# Patient Record
Sex: Female | Born: 1960
Health system: Southern US, Community
[De-identification: ages and names within clinical notes are randomized; demographics above are authoritative.]

## PROBLEM LIST (undated history)

## (undated) ENCOUNTER — Emergency Department (HOSPITAL_COMMUNITY): Discharge status: Absent without leave | Payer: Medicare Other

## (undated) DIAGNOSIS — F329 Major depressive disorder, single episode, unspecified: Secondary | ICD-10-CM

## (undated) DIAGNOSIS — I10 Essential (primary) hypertension: Secondary | ICD-10-CM

## (undated) DIAGNOSIS — G8929 Other chronic pain: Secondary | ICD-10-CM

## (undated) DIAGNOSIS — M1990 Unspecified osteoarthritis, unspecified site: Secondary | ICD-10-CM

## (undated) DIAGNOSIS — R002 Palpitations: Principal | ICD-10-CM

## (undated) DIAGNOSIS — Z8489 Family history of other specified conditions: Secondary | ICD-10-CM

## (undated) DIAGNOSIS — G4730 Sleep apnea, unspecified: Secondary | ICD-10-CM

## (undated) DIAGNOSIS — G43909 Migraine, unspecified, not intractable, without status migrainosus: Secondary | ICD-10-CM

## (undated) DIAGNOSIS — L02222 Furuncle of back [any part, except buttock]: Secondary | ICD-10-CM

## (undated) DIAGNOSIS — J189 Pneumonia, unspecified organism: Secondary | ICD-10-CM

## (undated) DIAGNOSIS — M545 Low back pain, unspecified: Secondary | ICD-10-CM

## (undated) DIAGNOSIS — F32A Depression, unspecified: Secondary | ICD-10-CM

## (undated) DIAGNOSIS — G629 Polyneuropathy, unspecified: Secondary | ICD-10-CM

## (undated) DIAGNOSIS — E0590 Thyrotoxicosis, unspecified without thyrotoxic crisis or storm: Secondary | ICD-10-CM

## (undated) DIAGNOSIS — E119 Type 2 diabetes mellitus without complications: Secondary | ICD-10-CM

## (undated) DIAGNOSIS — K219 Gastro-esophageal reflux disease without esophagitis: Secondary | ICD-10-CM

## (undated) DIAGNOSIS — E7800 Pure hypercholesterolemia, unspecified: Secondary | ICD-10-CM

## (undated) HISTORY — PX: LAPAROSCOPY FOR ECTOPIC PREGNANCY: SUR765

## (undated) HISTORY — PX: ESOPHAGOGASTRODUODENOSCOPY: SHX1529

## (undated) HISTORY — PX: COLONOSCOPY: SHX174

## (undated) HISTORY — DX: Palpitations: R00.2

## (undated) HISTORY — PX: TUBAL LIGATION: SHX77

## (undated) HISTORY — PX: BUNIONECTOMY: SHX129

## (undated) HISTORY — PX: TONSILLECTOMY: SUR1361

## (undated) HISTORY — PX: OTHER SURGICAL HISTORY: SHX169

## (undated) HISTORY — PX: BREAST BIOPSY: SHX20

## (undated) HISTORY — DX: Essential (primary) hypertension: I10

---

## 1999-02-22 HISTORY — PX: KNEE ARTHROSCOPY W/ MENISCAL REPAIR: SHX1877

## 1999-05-31 ENCOUNTER — Emergency Department (HOSPITAL_COMMUNITY): Admission: EM | Admit: 1999-05-31 | Discharge: 1999-05-31 | Payer: Self-pay | Admitting: Emergency Medicine

## 1999-07-24 ENCOUNTER — Emergency Department (HOSPITAL_COMMUNITY): Admission: EM | Admit: 1999-07-24 | Discharge: 1999-07-24 | Payer: Self-pay | Admitting: Emergency Medicine

## 2000-05-29 ENCOUNTER — Encounter (HOSPITAL_COMMUNITY): Admission: RE | Admit: 2000-05-29 | Discharge: 2000-06-28 | Payer: Self-pay | Admitting: Specialist

## 2000-08-04 ENCOUNTER — Emergency Department (HOSPITAL_COMMUNITY): Admission: EM | Admit: 2000-08-04 | Discharge: 2000-08-04 | Payer: Self-pay | Admitting: Emergency Medicine

## 2000-08-04 ENCOUNTER — Encounter: Payer: Self-pay | Admitting: Emergency Medicine

## 2000-11-29 ENCOUNTER — Emergency Department (HOSPITAL_COMMUNITY): Admission: EM | Admit: 2000-11-29 | Discharge: 2000-11-29 | Payer: Self-pay | Admitting: Emergency Medicine

## 2001-12-18 ENCOUNTER — Encounter (HOSPITAL_COMMUNITY): Admission: RE | Admit: 2001-12-18 | Discharge: 2002-01-17 | Payer: Self-pay

## 2002-04-01 ENCOUNTER — Emergency Department (HOSPITAL_COMMUNITY): Admission: EM | Admit: 2002-04-01 | Discharge: 2002-04-01 | Payer: Self-pay

## 2003-04-27 ENCOUNTER — Emergency Department (HOSPITAL_COMMUNITY): Admission: AD | Admit: 2003-04-27 | Discharge: 2003-04-27 | Payer: Self-pay | Admitting: Emergency Medicine

## 2003-04-27 IMAGING — CR DG LUMBAR SPINE COMPLETE 4+V
5 series · 5 of 5 positions shown · non-contrast
Comparison: none

CLINICAL DATA: 42-year-old, low back pain.
 FIVE-VIEW LUMBAR SPINE SERIES
 There are five non-rib-bearing lumbar type vertebral bodies.  Lateral film demonstrates normal overall alignment.  vertebral bodies and disc spaces are maintained.  There is mild facet degenerative changes but no pars defects are seen.  Dense calcifications are noted in the aorta but no evidence for aneurysm.  
 IMPRESSION 
 Normal alignment.  No acute bony findings.
 Mild facet degenerative changes.
 Dense aortic calcifications, quite advanced for age.

[view not recorded (1 of 5)]
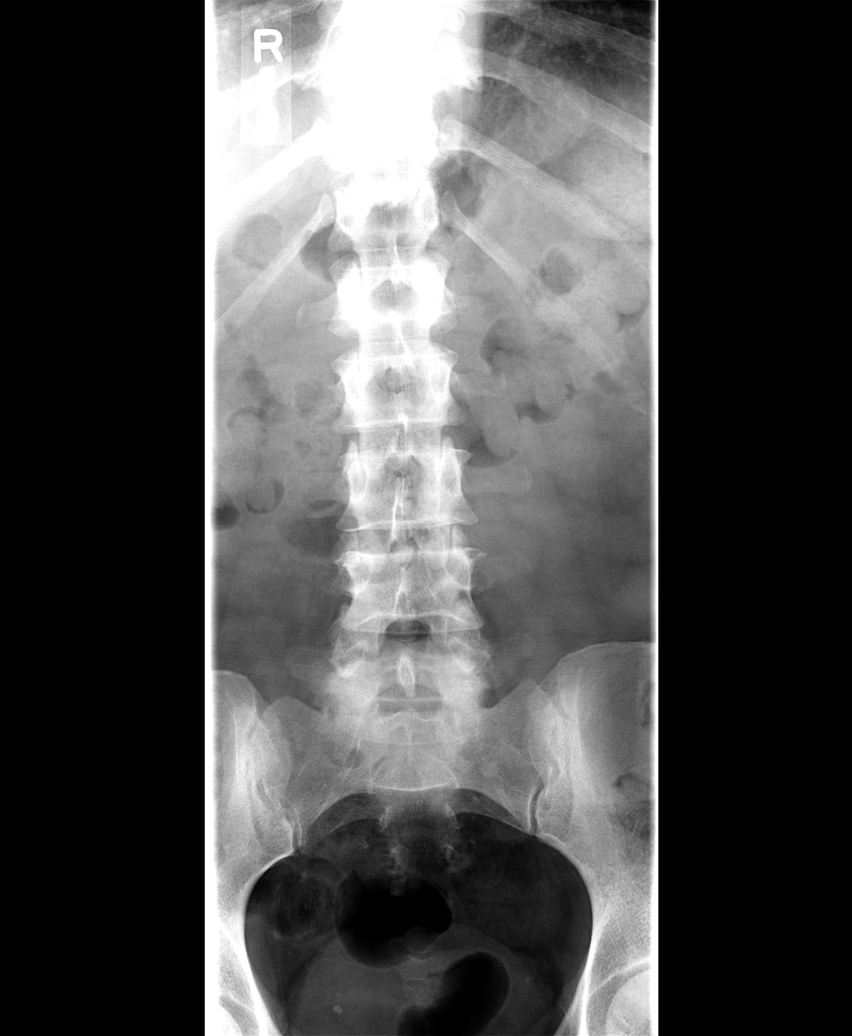

[view not recorded (2 of 5)]
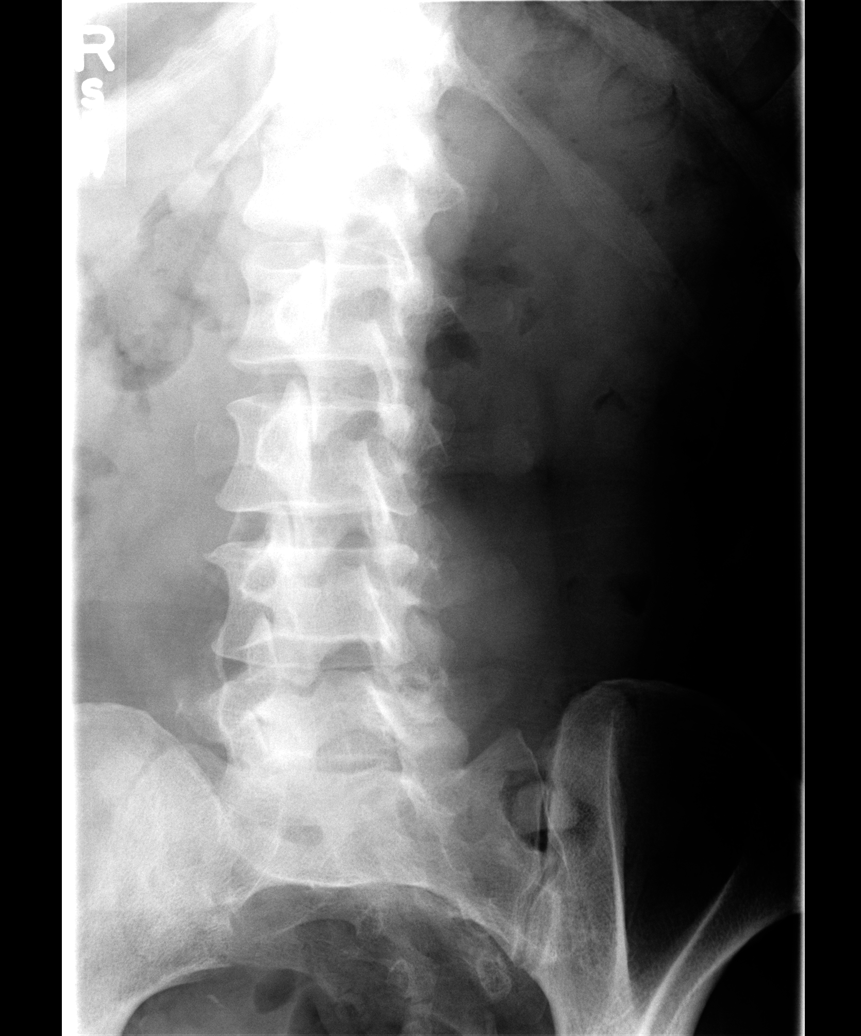

[view not recorded (3 of 5)]
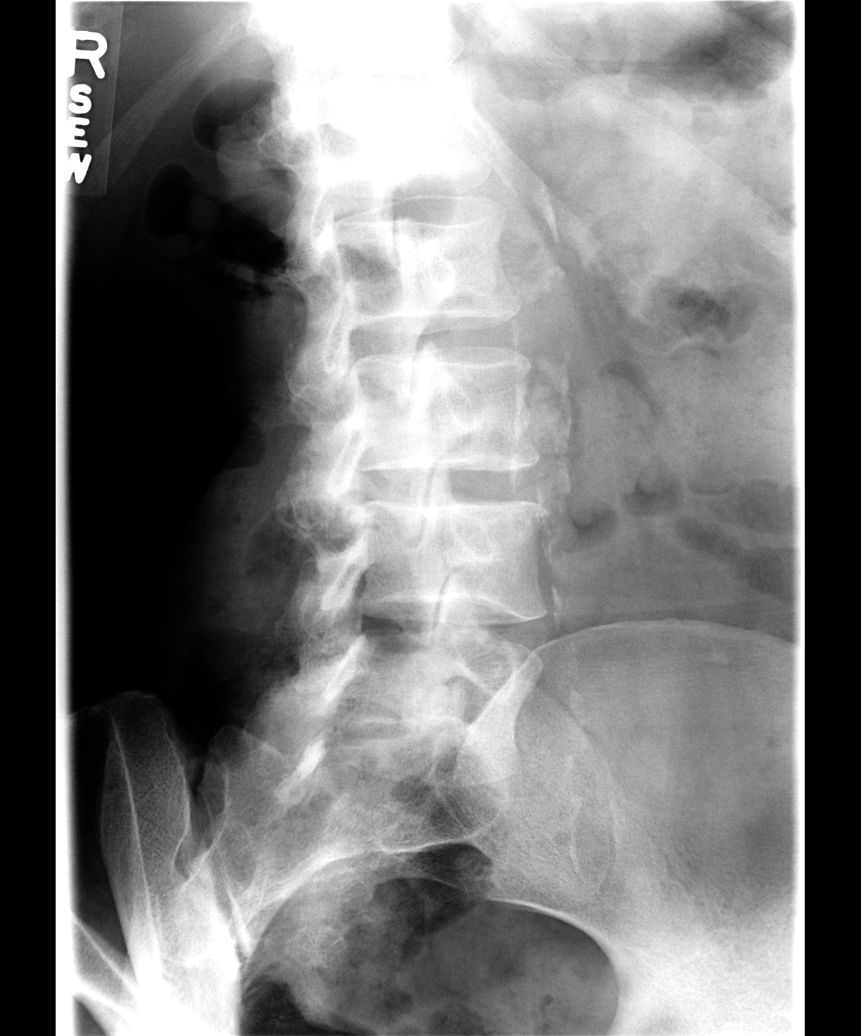

[view not recorded (4 of 5)]
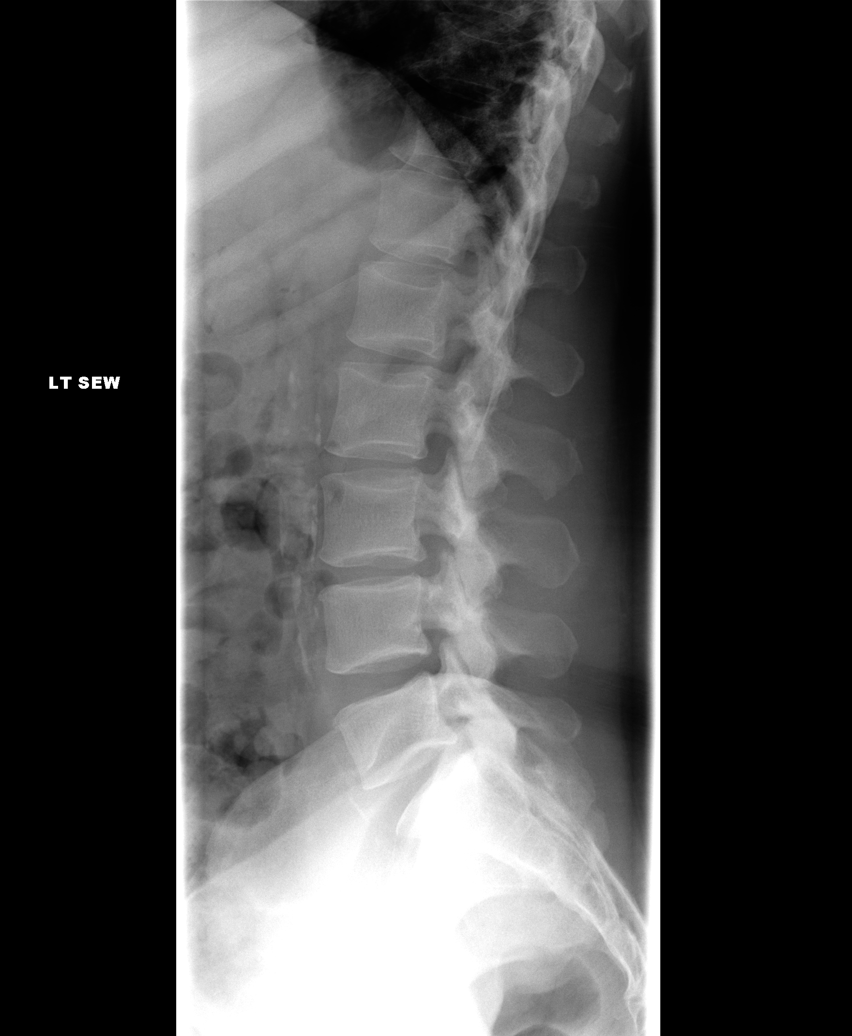

[view not recorded (5 of 5)]
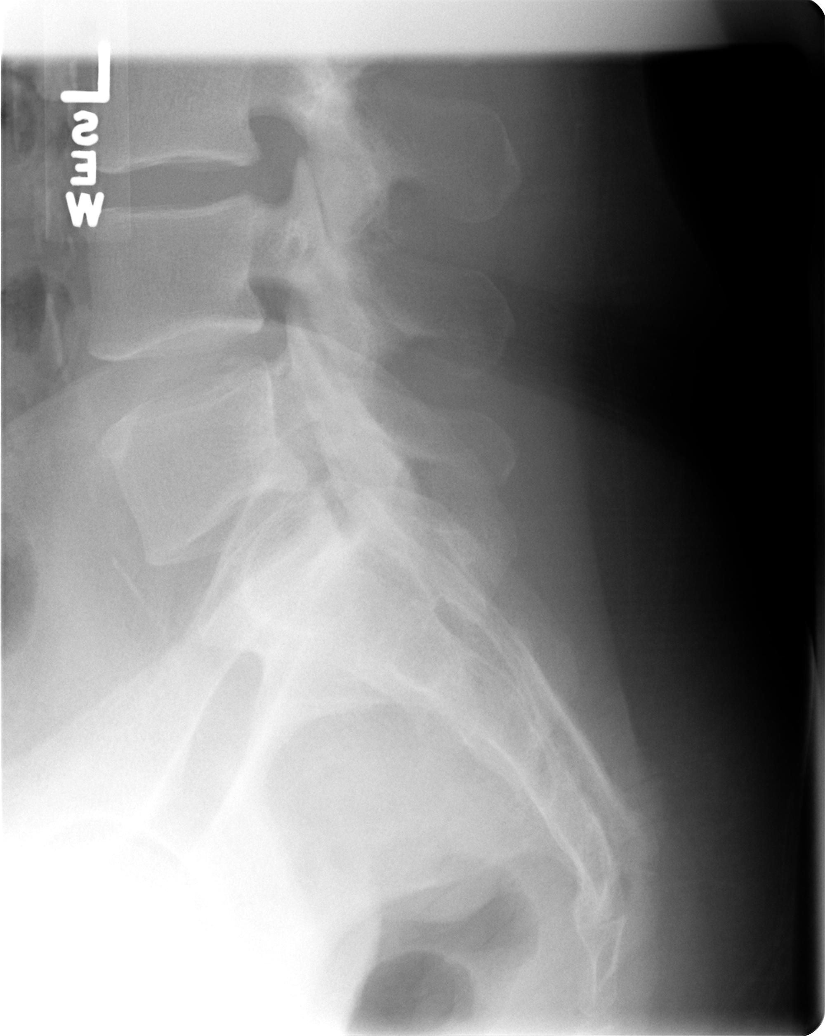

[5 of 5 positions shown; findings below may reference images not displayed]

## 2005-08-30 ENCOUNTER — Emergency Department (HOSPITAL_COMMUNITY): Admission: EM | Admit: 2005-08-30 | Discharge: 2005-08-30 | Payer: Self-pay | Admitting: Emergency Medicine

## 2006-11-18 ENCOUNTER — Emergency Department (HOSPITAL_COMMUNITY): Admission: EM | Admit: 2006-11-18 | Discharge: 2006-11-18 | Payer: Self-pay | Admitting: Emergency Medicine

## 2007-02-22 HISTORY — PX: REPLACEMENT TOTAL KNEE: SUR1224

## 2007-06-14 ENCOUNTER — Inpatient Hospital Stay (HOSPITAL_COMMUNITY): Admission: RE | Admit: 2007-06-14 | Discharge: 2007-06-16 | Payer: Self-pay | Admitting: Orthopaedic Surgery

## 2008-01-29 ENCOUNTER — Ambulatory Visit (HOSPITAL_COMMUNITY): Admission: RE | Admit: 2008-01-29 | Discharge: 2008-01-29 | Payer: Self-pay | Admitting: Family Medicine

## 2008-01-29 IMAGING — US US SOFT TISSUE HEAD/NECK
1 series · 14 of 25 positions shown · non-contrast
Comparison: None

CLINICAL DATA: Large left thyroid lobe

THYROID ULTRASOUND
TECHNIQUE: Ultrasound examination of the thyroid gland and
adjacent soft tissues was performed.

[Series 1: us soft tissue head/neck · 0.07mm/px · 14 of 49 slices shown]
[im 1/49]
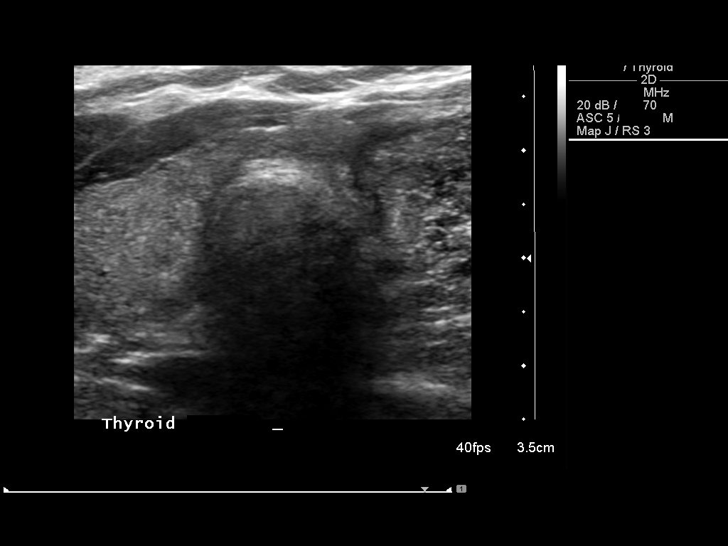
[im 5/49]
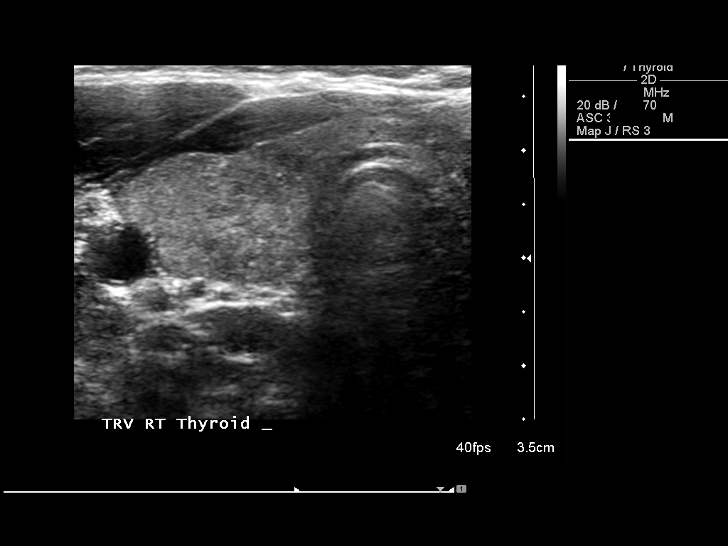
[im 9/49]
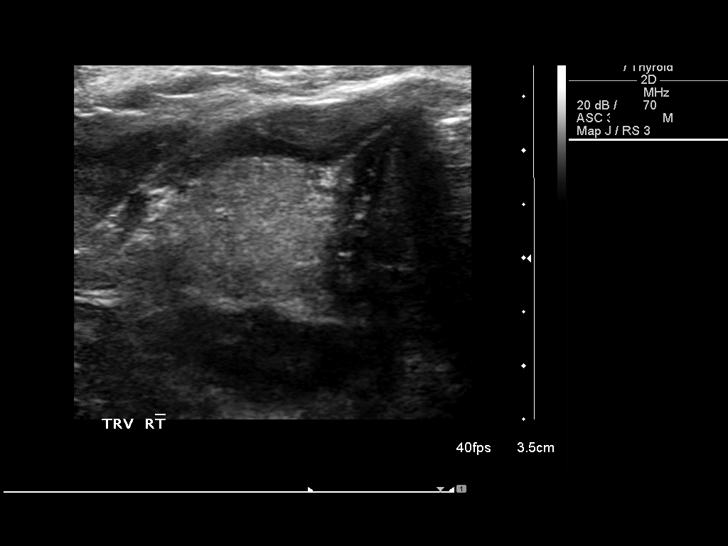
[im 13/49]
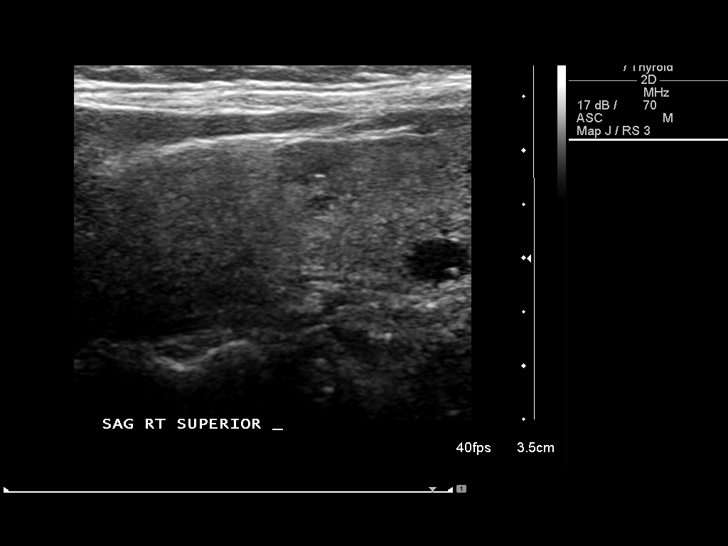
[im 17/49]
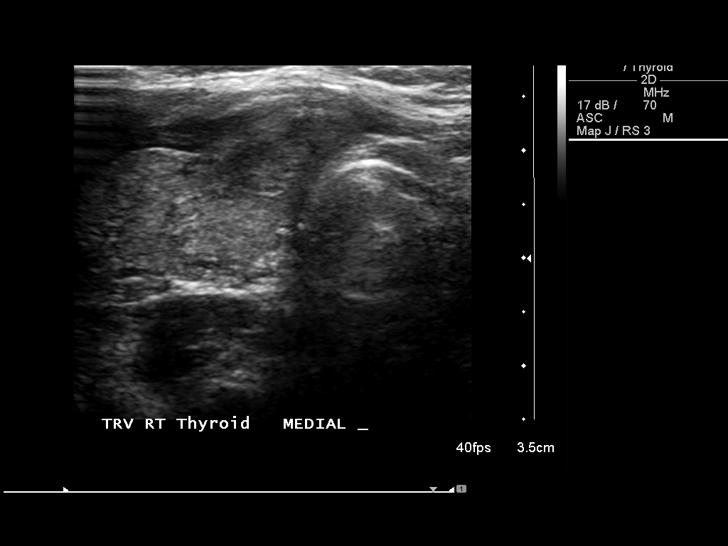
[im 19/49]
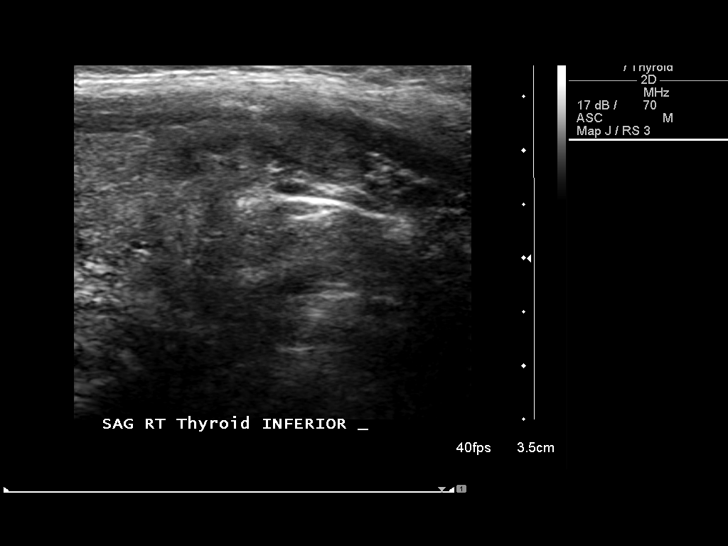
[im 23/49]
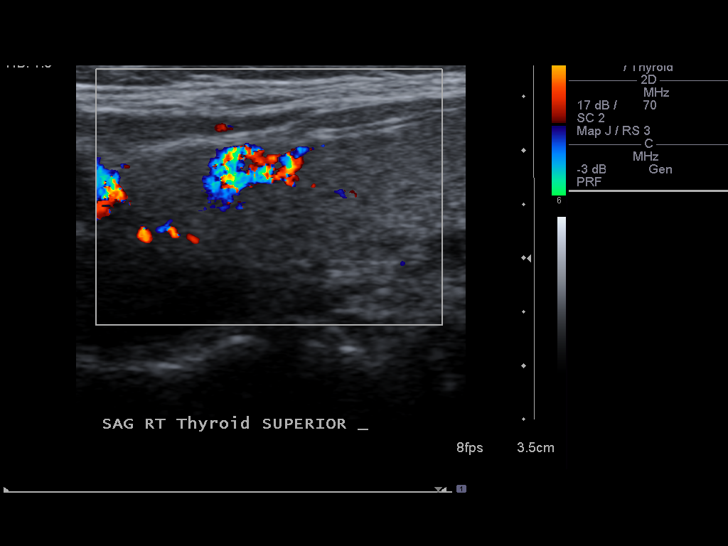
[im 27/49]
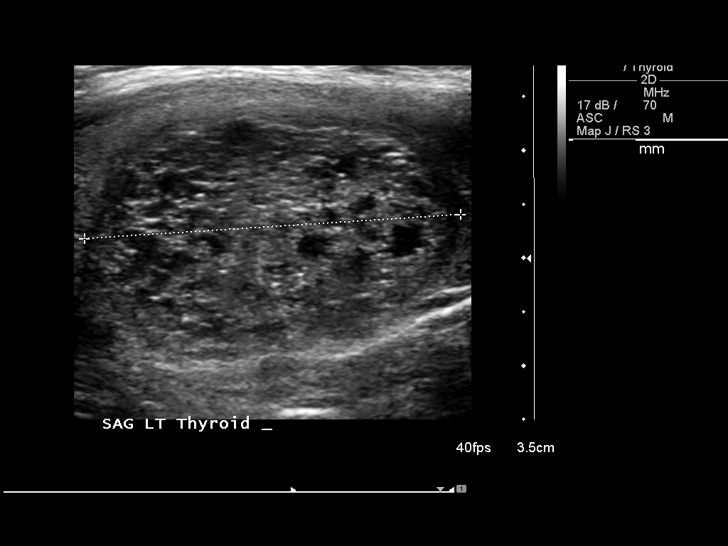
[im 31/49]
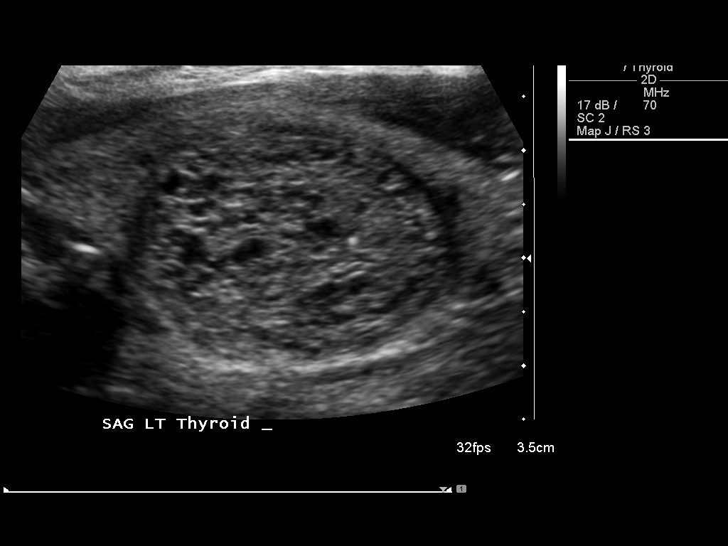
[im 33/49]
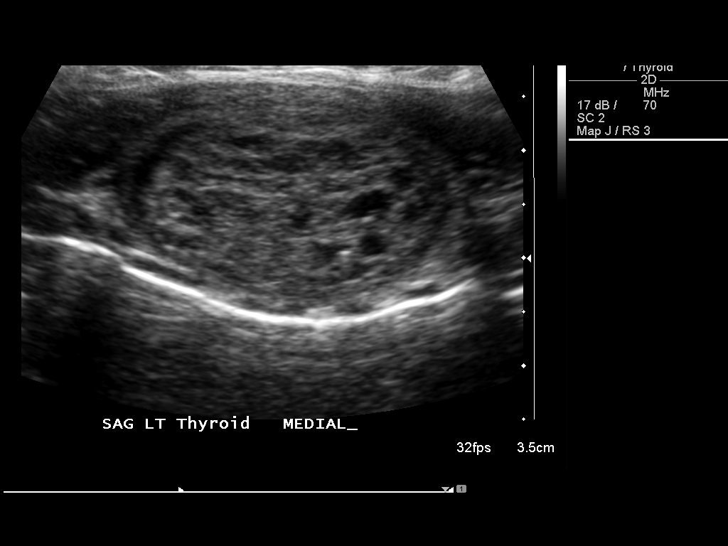
[im 37/49]
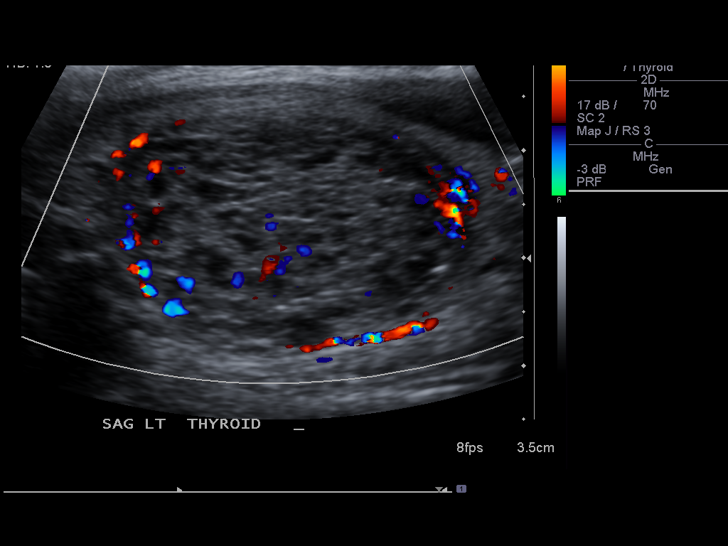
[im 41/49]
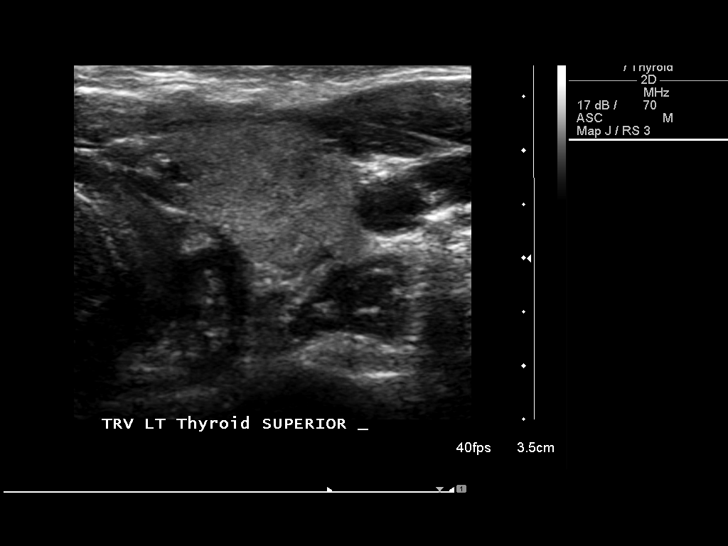
[im 45/49]
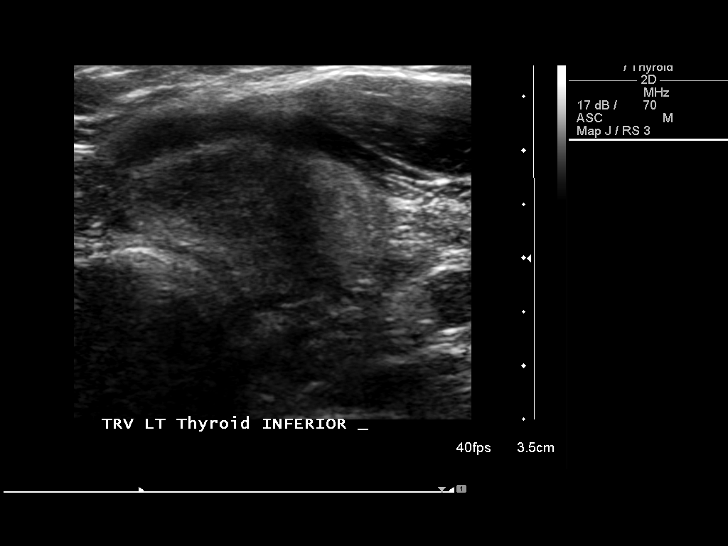
[im 49/49]
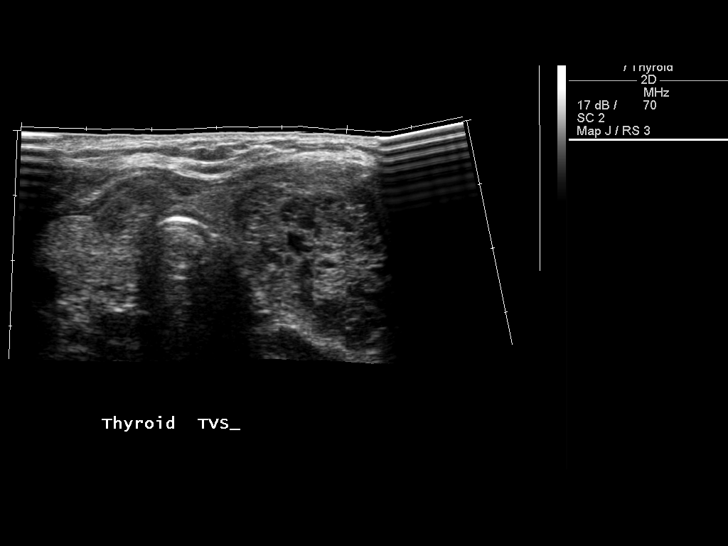

[14 of 25 positions shown; findings below may reference images not displayed]

FINDINGS: Right thyroid lobe 7.4 cm length by 1.7 cm AP by 1.9 cm transverse.
Left thyroid lobe 4.7 cm length by 2.6 cm AP by 3.8 cm transverse.
Minimally inhomogeneous thyroid echogenicity bilaterally.
Multiple tiny nodules seen within right thyroid lobe.
Additionally, 18 x 7 x 10 mm nodule identified medially at inferior
pole right lobe.
Dominant mass identified mid to lower left thyroid lobe, 3.5 x
x 3.3 cm.
Masses inhomogeneous, well-circumscribed, and is noncalcified.
Masses hypervascular on color Doppler imaging.
Neoplasm not excluded and tissue diagnosis recommended.
No regional adenopathy.
IMPRESSION: Multiple small right thyroid nodules including and 18 x 7 x 10 mm
nonspecific nodule medial aspect inferior pole right thyroid lobe.
Dominant left thyroid mass, 3.5 cm greatest diameter as above,
hypervascular and nonspecific in appearance, malignancy not
excluded, tissue diagnosis recommended.

## 2008-06-14 IMAGING — CR DG CHEST 2V
2 series · 2 of 2 positions shown · non-contrast
Comparison: No prior studies.

CLINICAL DATA: Degenerative joint disease.  Preoperative chest x-
ray.

CHEST - 2 VIEW

[view not recorded (1 of 2)]
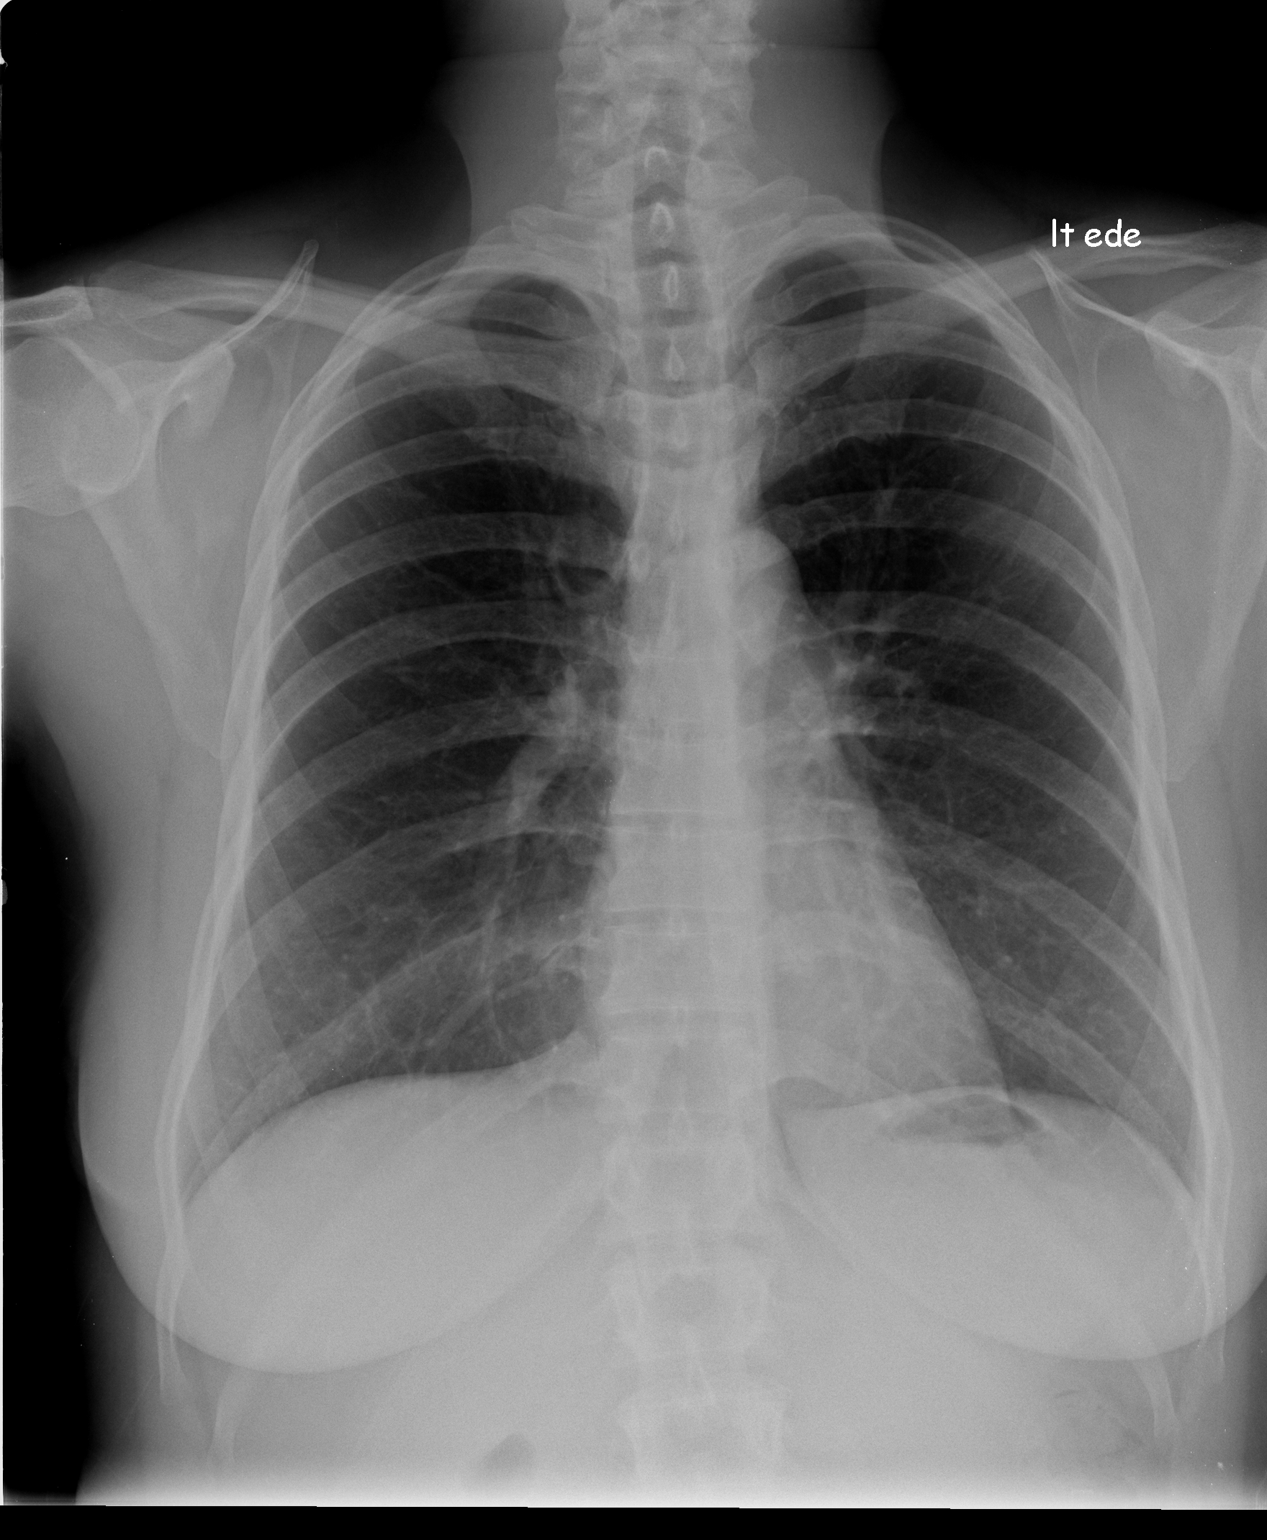

[view not recorded (2 of 2)]
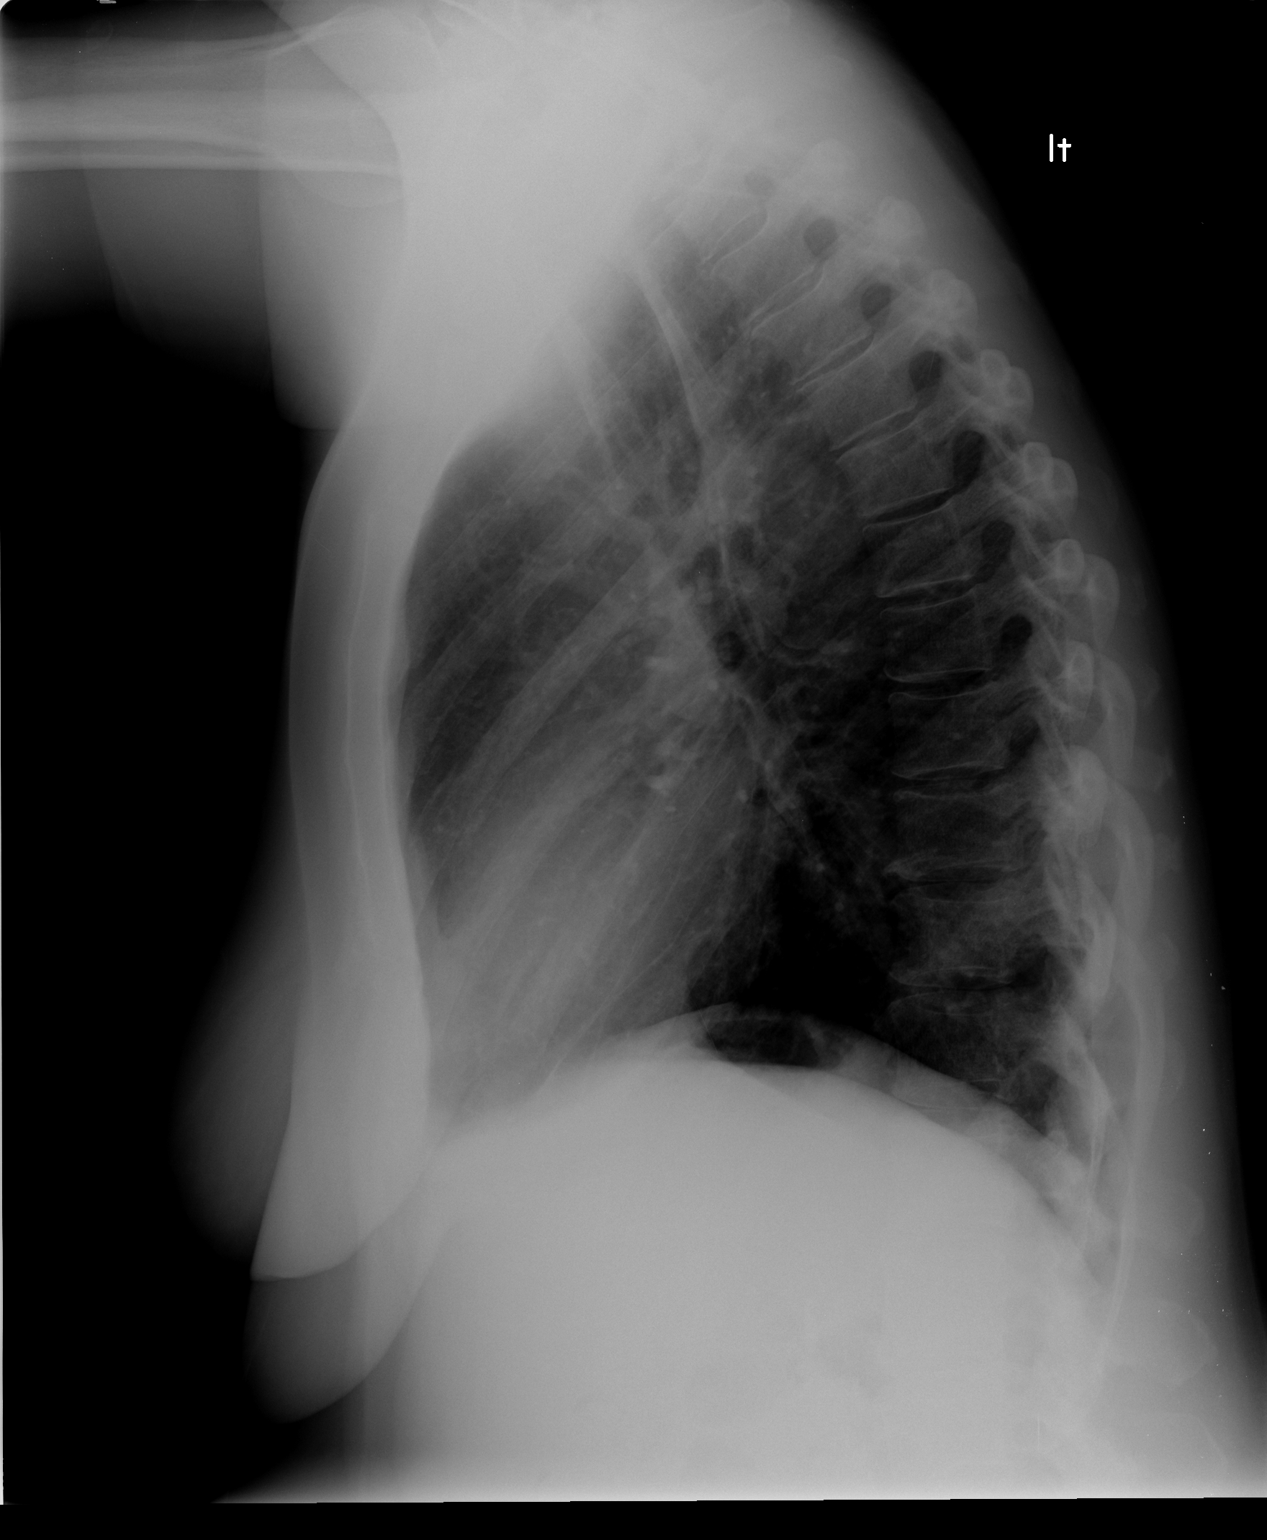

[2 of 2 positions shown; findings below may reference images not displayed]

FINDINGS: The lungs are clear.  The heart and mediastinal
structures are normal.  The osseous structures are unremarkable.
IMPRESSION: No evidence for active chest disease.

## 2009-06-11 ENCOUNTER — Emergency Department (HOSPITAL_COMMUNITY): Admission: EM | Admit: 2009-06-11 | Discharge: 2009-06-11 | Payer: Self-pay | Admitting: Emergency Medicine

## 2009-06-20 ENCOUNTER — Emergency Department (HOSPITAL_COMMUNITY): Admission: EM | Admit: 2009-06-20 | Discharge: 2009-06-20 | Payer: Self-pay | Admitting: Emergency Medicine

## 2009-07-22 ENCOUNTER — Inpatient Hospital Stay (HOSPITAL_COMMUNITY): Admission: EM | Admit: 2009-07-22 | Discharge: 2009-07-24 | Payer: Self-pay | Admitting: Emergency Medicine

## 2009-07-22 IMAGING — CT CT ABD-PELV W/ CM
2 of 5 series · 17 of 46 positions shown, 19 images · IV contrast (water/omni  & 100 ML OMNI 300)
Comparison: None.

CLINICAL DATA: Abdominal pain, nausea, vomiting, diarrhea and
chills for the past week.  History of diabetes and chronic back
pain.

CT ABDOMEN AND PELVIS WITH CONTRAST
TECHNIQUE: Multidetector CT imaging of the abdomen and pelvis was
performed following the standard protocol during bolus
administration of intravenous contrast.
Contrast: 100 ml [QW]

[Series 2: routine abdomen · axial · 0.81mm/px · z∈[-430,-70]mm · 14 of 82 slices shown, 16 images]
[im 5/82  soft-tissue]
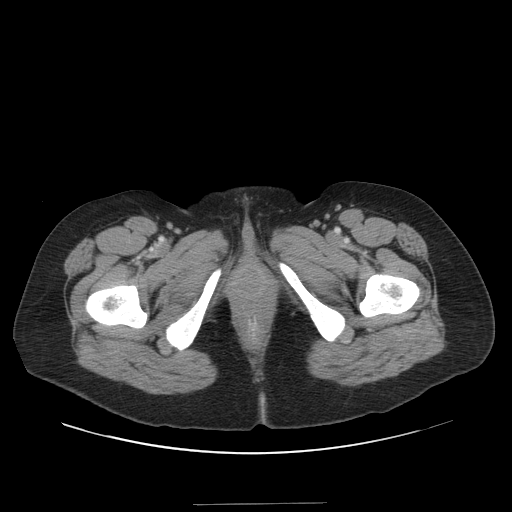
[im 5/82  bone]
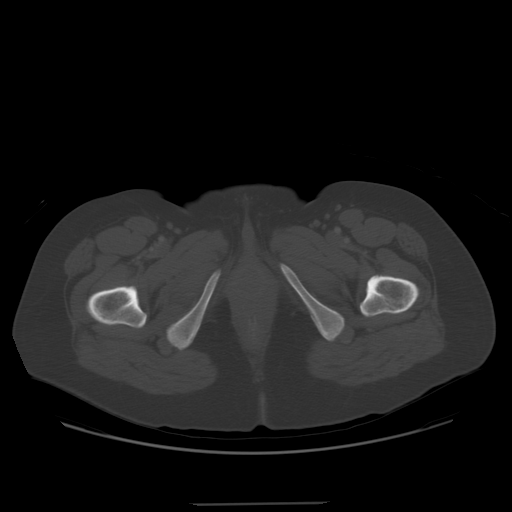
[im 10/82  soft-tissue]
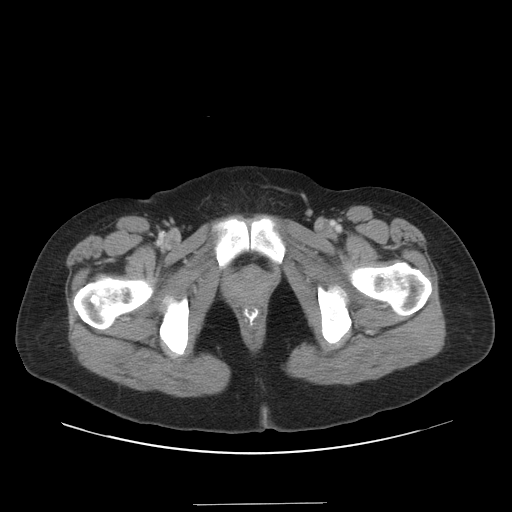
[im 19/82  soft-tissue]
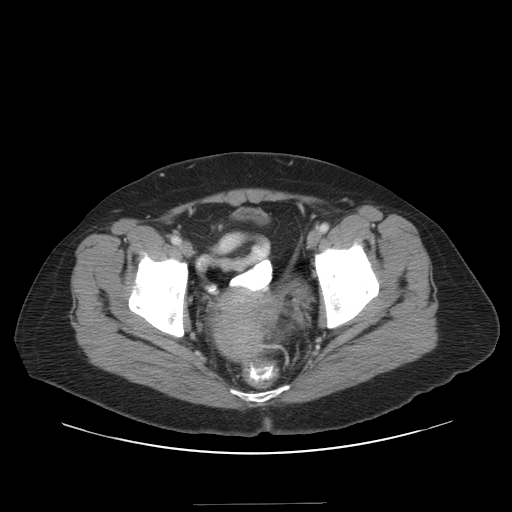
[im 23/82  soft-tissue]
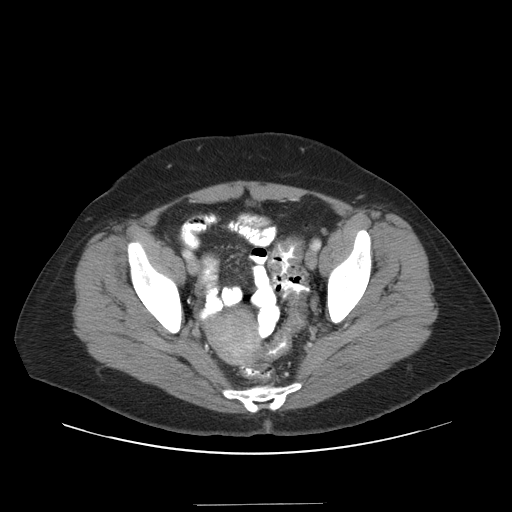
[im 28/82  soft-tissue]
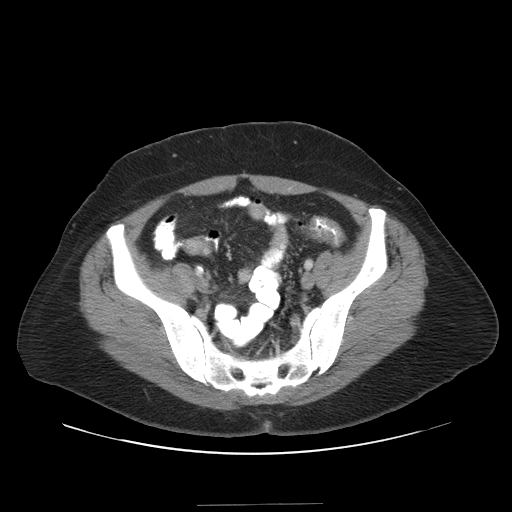
[im 32/82  soft-tissue]
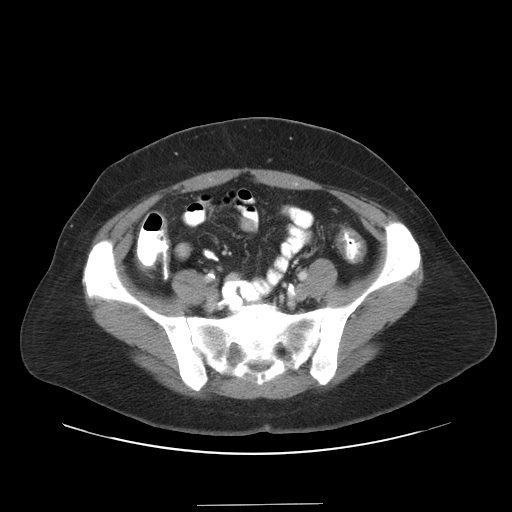
[im 37/82  soft-tissue]
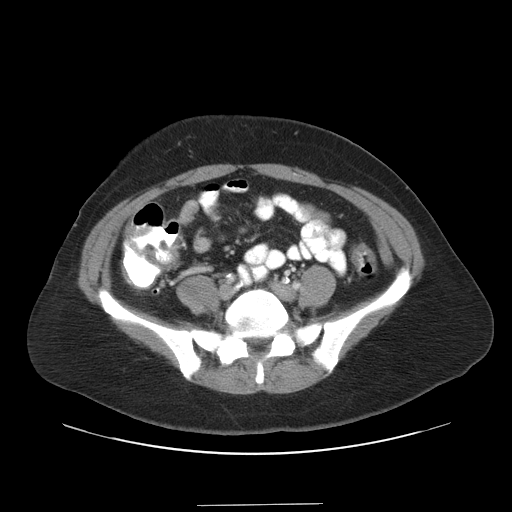
[im 46/82  soft-tissue]
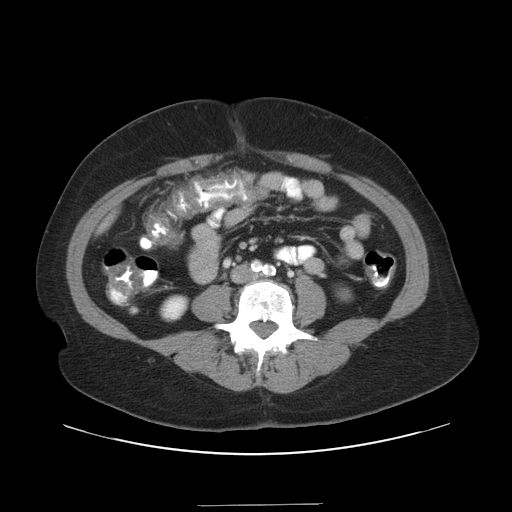
[im 50/82  soft-tissue]
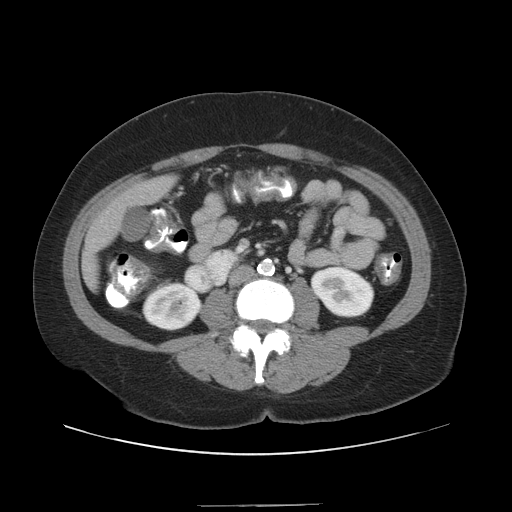
[im 50/82  bone]
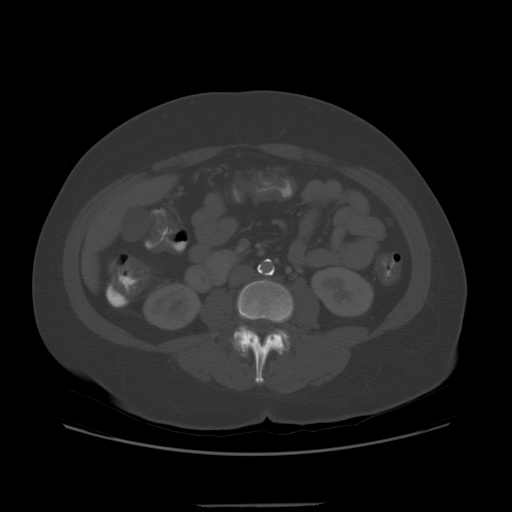
[im 55/82  soft-tissue]
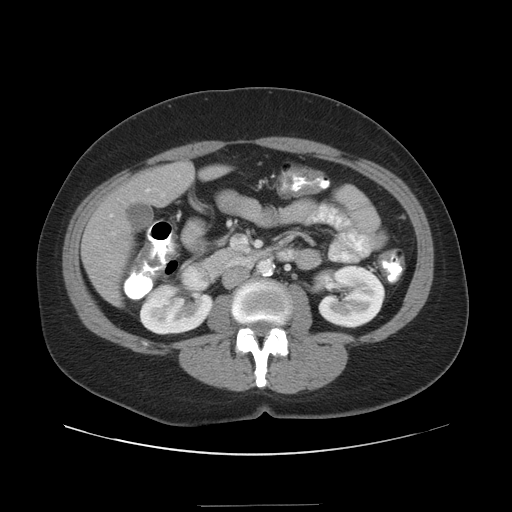
[im 59/82  soft-tissue]
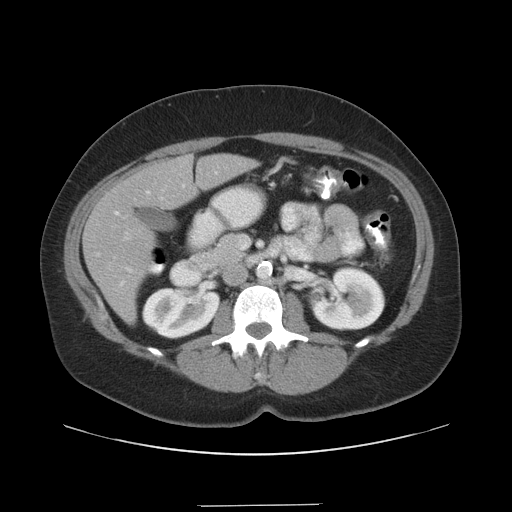
[im 64/82  soft-tissue]
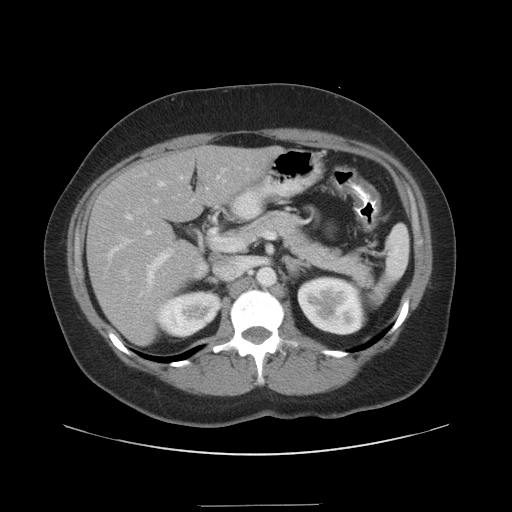
[im 73/82  soft-tissue]
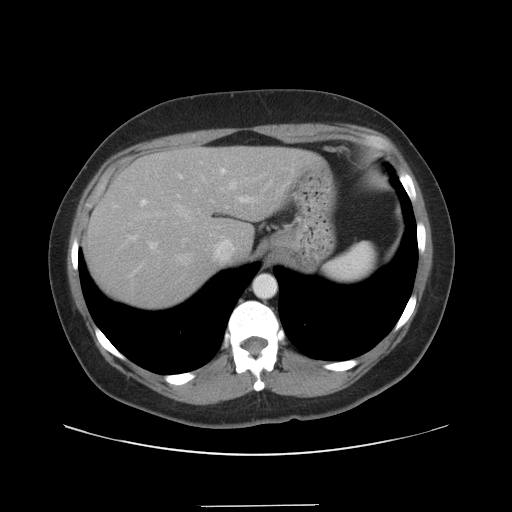
[im 77/82  soft-tissue]
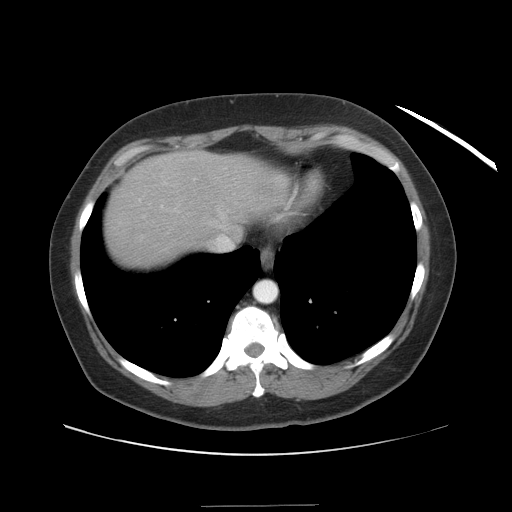

[Series 401: coronal · coronal · 0.81mm/px · 3 of 84 slices shown]
[im 28/84  soft-tissue]
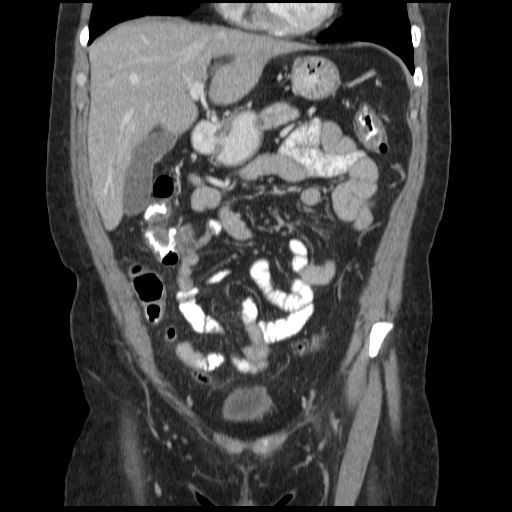
[im 37/84  soft-tissue]
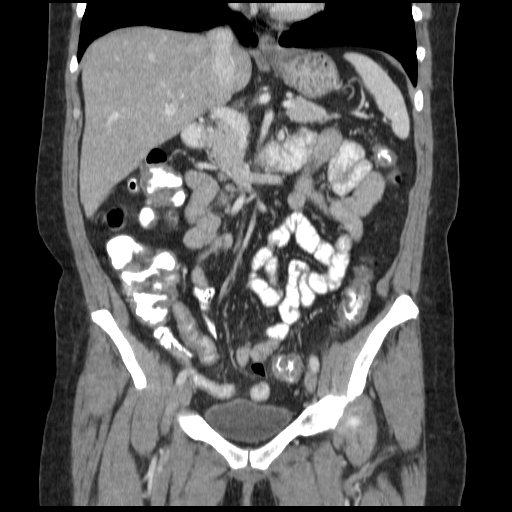
[im 47/84  soft-tissue]
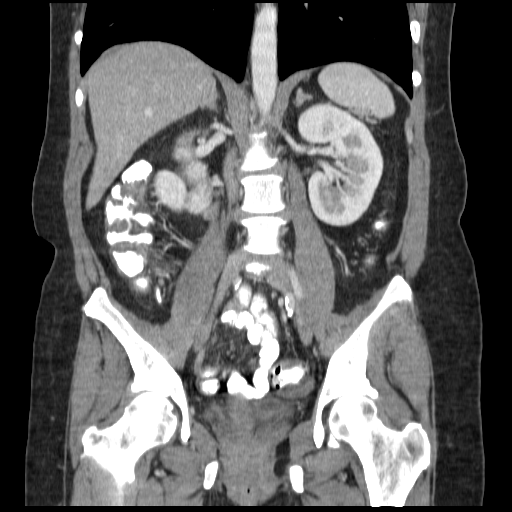

[17 of 46 positions shown; findings below may reference images not displayed]

FINDINGS: Diffuse colon wall thickening.  In the distal sigmoid
colon, this has a shouldering appearance.  Normal appearing
appendix and small bowel loops.  1.0 cm left ovarian cyst.
Unremarkable uterus and right ovary.  Unremarkable urinary bladder,
kidneys, spleen, pancreas and gallbladder.  Mild diffuse bilateral
adrenal hypertrophy.  Mild diffuse fatty infiltration of the liver.
Several tiny rounded areas of low density in the liver, compatible
with small cysts.  No enlarged lymph nodes.  Clear lung bases.
Mild lumbar and lower thoracic spine degenerative changes.
IMPRESSION: 1.  Diffuse colon wall thickening, compatible with diffuse colitis.
2.  The wall thickening in the distal sigmoid colon is associated
with shouldering, suggesting the possibility of a primary colon
malignancy.  Sigmoidoscopy/colonoscopy correlation is recommended.
3.  Mild diffuse fatty infiltration of the liver.

## 2009-07-24 ENCOUNTER — Encounter (INDEPENDENT_AMBULATORY_CARE_PROVIDER_SITE_OTHER): Payer: Self-pay | Admitting: Internal Medicine

## 2009-09-24 ENCOUNTER — Encounter: Admission: RE | Admit: 2009-09-24 | Discharge: 2009-09-24 | Payer: Self-pay | Admitting: *Deleted

## 2010-03-02 ENCOUNTER — Encounter
Admission: RE | Admit: 2010-03-02 | Discharge: 2010-03-02 | Payer: Self-pay | Source: Home / Self Care | Attending: *Deleted | Admitting: *Deleted

## 2010-03-14 ENCOUNTER — Encounter: Payer: Self-pay | Admitting: Surgery

## 2010-05-10 LAB — DIFFERENTIAL
Eosinophils Absolute: 0.2 10*3/uL (ref 0.0–0.7)
Eosinophils Relative: 1 % (ref 0–5)
Eosinophils Relative: 2 % (ref 0–5)
Lymphocytes Relative: 17 % (ref 12–46)
Lymphocytes Relative: 29 % (ref 12–46)
Lymphs Abs: 2.2 10*3/uL (ref 0.7–4.0)
Lymphs Abs: 2.8 10*3/uL (ref 0.7–4.0)
Monocytes Absolute: 1 10*3/uL (ref 0.1–1.0)
Monocytes Relative: 10 % (ref 3–12)
Neutro Abs: 9.9 10*3/uL — ABNORMAL HIGH (ref 1.7–7.7)
Neutrophils Relative %: 76 % (ref 43–77)

## 2010-05-10 LAB — URINALYSIS, ROUTINE W REFLEX MICROSCOPIC
Ketones, ur: >80 mg/dL — AB
Nitrite: NEGATIVE
Protein, ur: NEGATIVE mg/dL
Urobilinogen, UA: 0.2 mg/dL (ref 0.0–1.0)

## 2010-05-10 LAB — CLOSTRIDIUM DIFFICILE EIA: C difficile Toxins A+B, EIA: NEGATIVE

## 2010-05-10 LAB — GIARDIA/CRYPTOSPORIDIUM SCREEN(EIA)
Cryptosporidium Screen (EIA): NEGATIVE
Giardia Screen - EIA: NEGATIVE

## 2010-05-10 LAB — GLUCOSE, CAPILLARY
Glucose-Capillary: 100 mg/dL — ABNORMAL HIGH (ref 70–99)
Glucose-Capillary: 102 mg/dL — ABNORMAL HIGH (ref 70–99)
Glucose-Capillary: 105 mg/dL — ABNORMAL HIGH (ref 70–99)
Glucose-Capillary: 115 mg/dL — ABNORMAL HIGH (ref 70–99)
Glucose-Capillary: 123 mg/dL — ABNORMAL HIGH (ref 70–99)
Glucose-Capillary: 131 mg/dL — ABNORMAL HIGH (ref 70–99)
Glucose-Capillary: 91 mg/dL (ref 70–99)
Glucose-Capillary: 94 mg/dL (ref 70–99)

## 2010-05-10 LAB — BASIC METABOLIC PANEL
BUN: <1 mg/dL — ABNORMAL LOW (ref 6–23)
CO2: 27 mEq/L (ref 19–32)
Chloride: 107 mEq/L (ref 96–112)
GFR calc Af Amer: >60 mL/min (ref >60)
Potassium: 3.2 mEq/L — ABNORMAL LOW (ref 3.5–5.1)

## 2010-05-10 LAB — COMPREHENSIVE METABOLIC PANEL
ALT: 12 U/L (ref 0–35)
AST: 15 U/L (ref 0–37)
AST: 17 U/L (ref 0–37)
Albumin: 2.8 g/dL — ABNORMAL LOW (ref 3.5–5.2)
BUN: 4 mg/dL — ABNORMAL LOW (ref 6–23)
CO2: 25 mEq/L (ref 19–32)
CO2: 25 mEq/L (ref 19–32)
Calcium: 8.1 mg/dL — ABNORMAL LOW (ref 8.4–10.5)
Calcium: 9 mg/dL (ref 8.4–10.5)
Creatinine, Ser: 0.69 mg/dL (ref 0.4–1.2)
Creatinine, Ser: 0.74 mg/dL (ref 0.4–1.2)
GFR calc Af Amer: >60 mL/min (ref >60)
GFR calc Af Amer: >60 mL/min (ref >60)
GFR calc non Af Amer: >60 mL/min (ref >60)
Sodium: 140 mEq/L (ref 135–145)
Total Protein: 5.7 g/dL — ABNORMAL LOW (ref 6.0–8.3)

## 2010-05-10 LAB — RAPID URINE DRUG SCREEN, HOSP PERFORMED
Amphetamines: NOT DETECTED
Benzodiazepines: POSITIVE — AB
Cocaine: POSITIVE — AB

## 2010-05-10 LAB — LIPID PANEL
Cholesterol: 109 mg/dL (ref 0–200)
HDL: 28 mg/dL — ABNORMAL LOW (ref >39)
Total CHOL/HDL Ratio: 3.9 RATIO
VLDL: 20 mg/dL (ref 0–40)

## 2010-05-10 LAB — STOOL CULTURE

## 2010-05-10 LAB — LIPASE, BLOOD: Lipase: 16 U/L (ref 11–59)

## 2010-05-10 LAB — CBC
HCT: 43.5 % (ref 36.0–46.0)
MCHC: 34.3 g/dL (ref 30.0–36.0)
MCHC: 34.7 g/dL (ref 30.0–36.0)
MCV: 100.4 fL — ABNORMAL HIGH (ref 78.0–100.0)
MCV: 99.2 fL (ref 78.0–100.0)
Platelets: 337 10*3/uL (ref 150–400)
RBC: 3.7 MIL/uL — ABNORMAL LOW (ref 3.87–5.11)
RBC: 4.33 MIL/uL (ref 3.87–5.11)

## 2010-05-10 LAB — TSH: TSH: 0.034 u[IU]/mL — ABNORMAL LOW (ref 0.350–4.500)

## 2010-05-10 LAB — PROTIME-INR
INR: 1.03 (ref 0.00–1.49)
Prothrombin Time: 13.4 seconds (ref 11.6–15.2)

## 2010-05-10 LAB — URINE MICROSCOPIC-ADD ON

## 2010-05-10 LAB — HEMOGLOBIN A1C: Mean Plasma Glucose: 128 mg/dL — ABNORMAL HIGH (ref <117)

## 2010-07-06 NOTE — Op Note (Signed)
NAME:  Karla Baker, Karla Baker NO.:  [id number]   MEDICAL RECORD NO.:  [id number]          PATIENT TYPE:  INP   LOCATION:  5019                         FACILITY:  [facility]   PHYSICIAN:  Lubertha Basque. Dalldorf, M.D.DATE OF BIRTH:  01/20/1961   DATE OF PROCEDURE:  06/14/2007  DATE OF DISCHARGE:                               OPERATIVE REPORT   PREOPERATIVE DIAGNOSIS:  Left knee degenerative arthritis.   POSTOPERATIVE DIAGNOSIS:  Left knee degenerative arthritis.   PROCEDURE:  Left total knee replacement.   ANESTHESIA:  General and block.   ATTENDING SURGEON:  Lubertha Basque. Jerl Santos, MD   ASSISTANT:  Lindwood Qua, PA   INDICATIONS FOR PROCEDURE:  The patient is a 50 year old woman with a  long history of left knee difficulty.  She has persisted with problems  despite arthroscopy including an ACL reconstruction, multiple  injections, and oral anti-inflammatories.  On x-ray, she has advanced  degenerative change.  She has pain, which limits her ability to bear  weight and rest, and she is offered a knee replacement.  Informed  operative consent was obtained after discussion of possible  complications of reaction to anesthesia, infection, DVT, PE, and death.   SUMMARY, FINDINGS, AND PROCEDURE:  Under general anesthesia and a block,  a left knee replacement was performed through a standard longitudinal  anterior incision.  She did have advanced degenerative change  patellofemoral and lateral.  She had excellent bone quality.  We removed  the screw from the tibia, but left her femoral screw as it was out of  the way.  We then replaced her knee with a DePuy LCS system using a  standard femur, 3 tibia, 10-mm deep spacer, and 35-mm ALL-polyethylene  patella.  We did include antibiotic in the cement.  Lindwood Qua  assisted throughout and was invaluable to the completion of the case in  that he helped position to retract while I performed the procedure.  He  also closed  simultaneously to help minimize OR time.   DESCRIPTION OF PROCEDURE:  The patient was in the operating suite where  general anesthetic was applied without difficulty.  She was also given a  block in the preanesthesia area.  She was positioned supine and prepped,  draped in normal sterile fashion.  After administration of preop IV  Kefzol, the left leg was elevated, exsanguinated, and tourniquet  inflated about to the thigh.  A longitudinal anterior incision was made  incorporating her old midline incision.  Dissection was carried down to  the extensor mechanism.  A medial parapatellar incision was made.  All  appropriate anti-infected measures were used including the preoperative  IV antibiotic, Betadine-impregnated drape, and closed and closed hooded  exhaust systems for each member of surgical team.  The kneecap was  flipped, and the knee flexed.  Some residual meniscal tissues were  removed along with her PCL and remnant of reconstructed ACL.  The tibial  screw was obvious and was removed without difficulty with the  appropriate screwdriver.  The femoral screw could be seen but was not  removed as it was not near any of the cut that we had  to make.  An  extramedullary guide was placed in the tibia to make a proximal cut with  a slight posterior tilt.  An intramedullary guide was then placed in the  femur to make the anterior posterior cuts creating a flexion gap of 10  mm.  A second intramedullary guide was placed in the femur to make a  distal cut creating an equal extension gap of 10 mm.  The femur sized to  a standard and the tibia to a 3, and appropriate guides were placed and  utilized.  The patella was cut down on thickness by 10 mm to 16 and  sized to a 35 with appropriate guide placed and utilized.  The trial  reduction was done.  She easily came in slight hyperextension and flexed  well.  The patella tracked well.  Trial components were removed followed  by pulsatile lavage  irrigation of all 3 cut bony surfaces.  Cement was  mixed including Zinacef antibiotic.  It was pressurized onto all the  bones.  The aforementioned DePuy LPS components were then placed.  Excess cement was trimmed and pressure withheld in the components until  the cement hardened.  The tourniquet was deflated.  A small amount of  bleeding was easily controlled with Bovie cautery.  The wound was  irrigated followed by placement of drain exiting superolaterally.  The  extensor mechanism was reapproximated with #1 Vicryl in interrupted  fashion.  Subcutaneous tissues reapproximated with 0 and 2-0 undyed  Vicryl followed by skin closure with staples.  Adaptic was applied  followed by dry gauze and loose Ace wrap.  Estimated blood loss and  intraoperative fluids obtained from anesthesia records  As can accurate  tourniquet time which was under 1 hour.   DISPOSITION:  The patient was extubated in the operating room and taken  to recovery room in stable condition.  She was to be admitted to  orthopedic surgery service appropriate postop care to include  perioperative antibiotics and Coumadin plus Lovenox for DVT prophylaxis.      Lubertha Basque Jerl Santos, M.D.  Electronically Signed     PGD/MEDQ  D:  06/14/2007  T:  06/15/2007  Job:  [id number]

## 2010-07-09 NOTE — Discharge Summary (Signed)
NAME:  Karla Baker, Karla Baker NO.:  [id number]   MEDICAL RECORD NO.:  [id number]          PATIENT TYPE:  INP   LOCATION:  5019                         FACILITY:  [facility]   PHYSICIAN:  Lindwood Qua, P.A. DATE OF BIRTH:  07-22-1960   DATE OF ADMISSION:  06/14/2007  DATE OF DISCHARGE:  06/16/2007                               DISCHARGE SUMMARY   ADMITTING DIAGNOSES:  1. Left knee end-stage degenerative joint disease.  2. Depression.  3. Diabetes mellitus.  4. History of  tobacco abuse.   DISCHARGE DIAGNOSES:  1. Left knee end-stage degenerative joint disease.  2. Depression.  3. Diabetes mellitus.  4. History of  tobacco abuse.   OPERATION:  Left total knee replacement.   HISTORY OF PRESENT ILLNESS:  Ms. Jac Canavan is a 50 year old black  female patient well known to our practice.  She has undergone ACL  reconstruction by another doctor many years ago and since that time has  had increasing pain in her left knee, troubles when she walks, trouble  sleeping at nighttime, and x-rays reveals end-stage DJD particularly of  the medial compartment.  I discussed treatment options with her that  being total knee replacement.   PERTINENT LABORATORY AND X-RAY FINDINGS:  WBC is 9.3, hemoglobin 10.9,  hematocrit 31.5, and platelets 238.  Serial INRs were done, this was on  latest Coumadin protocol.  Sodium 141, potassium 3.5, glucose 85, BUN 4,  and creatinine .86.   COURSE IN THE HOSPITAL:  She was admitted postoperatively placed on the  variety of p.o. and analgesics for pain, IV Ancef 1 g q.8 h. x3 doses,  Foley catheter used for the first 24 hours and then discontinued.  Appropriate pain medications, PCA pump, Percocet, ferrous sulfate,  laxative of choice, antiemetics, ice, elevation, knee-high TEDs,  incentive spirometry, CPR machine initially 0-50, then advance as  tolerated 6-8 hours at night.  With physical therapy, she can be  weightbearing as tolerated.  The  first day postop, she worked well with  physical therapy.  Her blood pressure was 102/68, temperature 97,  hemoglobin 11.7.  Foley catheter was in place and it was later  discontinued that day.  Breath sounds were clear in all fields.  Abdomen  was soft.  The left side good sign of infection.  Hemovac was in but  removed on the second day postoperative.  There was no sign of  infection.  On the second day postoperative, her dressing was changed.  Wound was noted to be benign.  Good neurovascular status.  She was  progressing well with physical therapy and was discharged to home on the  appropriate date.   CONDITION ON DISCHARGE:  Improved.   FOLLOWUP:  She will remain on Fosamax 70 weekly; calcium; Wellbutrin 75  mg 2 tablets a day; metformin 850, half daily; simvastatin 20 mg a day;  citalopram 40 mg daily; alprazolam 1 mg t.i.d.; Combivent as needed and  had prescriptions for Coumadin dose regulated by pharmacy; and Percocet  1-2 q.4-6 h. p.r.n. pain, and also have [facility] for home  physical therapy and INR blood draws. Low-sodium and heart-healthy diet.  The patient to change her dressing on her left knee daily.  Be  weightbearing as tolerated with crutches or walker.  Keep this area  clean and dry.  Any sign of infection, to call our office at [phone]  and also the same number for an appointment in 7-10 days.      Lindwood Qua, P.A.     MC/MEDQ  D:  07/05/2007  T:  07/06/2007  Job:  [id number]

## 2010-08-03 ENCOUNTER — Other Ambulatory Visit: Payer: Self-pay | Admitting: Surgery

## 2010-08-03 DIAGNOSIS — R921 Mammographic calcification found on diagnostic imaging of breast: Secondary | ICD-10-CM

## 2010-08-03 DIAGNOSIS — N6001 Solitary cyst of right breast: Secondary | ICD-10-CM

## 2010-08-11 ENCOUNTER — Ambulatory Visit
Admission: RE | Admit: 2010-08-11 | Discharge: 2010-08-11 | Disposition: A | Discharge status: Home / Self Care | Payer: 59 | Source: Ambulatory Visit | Attending: *Deleted | Admitting: *Deleted

## 2010-08-11 ENCOUNTER — Other Ambulatory Visit: Payer: Self-pay | Admitting: Surgery

## 2010-08-11 DIAGNOSIS — N6001 Solitary cyst of right breast: Secondary | ICD-10-CM

## 2010-08-11 DIAGNOSIS — R921 Mammographic calcification found on diagnostic imaging of breast: Secondary | ICD-10-CM

## 2010-11-16 LAB — BASIC METABOLIC PANEL
BUN: 9
Calcium: 8.5
Calcium: 9.4
Chloride: 103
Creatinine, Ser: 0.86
GFR calc Af Amer: >60
GFR calc Af Amer: >60
GFR calc non Af Amer: >60
GFR calc non Af Amer: >60
Glucose, Bld: 101 — ABNORMAL HIGH
Glucose, Bld: 109 — ABNORMAL HIGH
Sodium: 138
Sodium: 141

## 2010-11-16 LAB — CBC
Hemoglobin: 11.7 — ABNORMAL LOW
MCV: 98.7
Platelets: 341
RBC: 3.19 — ABNORMAL LOW
RBC: 3.53 — ABNORMAL LOW
RDW: 14.8
RDW: 14.9
WBC: 11.4 — ABNORMAL HIGH
WBC: 9.3
WBC: 9.5

## 2010-11-16 LAB — DIFFERENTIAL
Basophils Absolute: 0.1
Lymphocytes Relative: 30
Lymphs Abs: 2.9
Neutro Abs: 5.6
Neutrophils Relative %: 59

## 2010-11-16 LAB — PROTIME-INR
INR: 1
INR: 1
INR: 1.6 — ABNORMAL HIGH
Prothrombin Time: 13.1
Prothrombin Time: 19.9 — ABNORMAL HIGH

## 2010-12-12 ENCOUNTER — Emergency Department (HOSPITAL_COMMUNITY)
Admission: EM | Admit: 2010-12-12 | Discharge: 2010-12-12 | Disposition: A | Discharge status: Home / Self Care | Payer: Self-pay | Attending: Emergency Medicine | Admitting: Emergency Medicine

## 2010-12-12 DIAGNOSIS — G8929 Other chronic pain: Secondary | ICD-10-CM | POA: Insufficient documentation

## 2010-12-12 DIAGNOSIS — M549 Dorsalgia, unspecified: Secondary | ICD-10-CM | POA: Insufficient documentation

## 2010-12-12 DIAGNOSIS — E119 Type 2 diabetes mellitus without complications: Secondary | ICD-10-CM | POA: Insufficient documentation

## 2011-07-06 ENCOUNTER — Other Ambulatory Visit: Payer: Self-pay | Admitting: Otolaryngology

## 2011-07-06 DIAGNOSIS — R921 Mammographic calcification found on diagnostic imaging of breast: Secondary | ICD-10-CM

## 2011-08-12 ENCOUNTER — Ambulatory Visit
Admission: RE | Admit: 2011-08-12 | Discharge: 2011-08-12 | Disposition: A | Discharge status: Home / Self Care | Payer: 59 | Source: Ambulatory Visit | Attending: Otolaryngology | Admitting: Otolaryngology

## 2011-08-12 DIAGNOSIS — R921 Mammographic calcification found on diagnostic imaging of breast: Secondary | ICD-10-CM

## 2011-12-21 ENCOUNTER — Other Ambulatory Visit: Payer: Self-pay | Admitting: Family Medicine

## 2011-12-21 ENCOUNTER — Ambulatory Visit
Admission: RE | Admit: 2011-12-21 | Discharge: 2011-12-21 | Disposition: A | Discharge status: Home / Self Care | Payer: 59 | Source: Ambulatory Visit | Attending: Family Medicine | Admitting: Family Medicine

## 2011-12-21 DIAGNOSIS — R05 Cough: Secondary | ICD-10-CM

## 2011-12-21 IMAGING — CR DG CHEST 2V
2 series · 2 of 2 positions shown · non-contrast
Comparison: Chest x-ray of [DATE]

CLINICAL DATA: Cough, short of breath, smoking history

CHEST - 2 VIEW

[w chest pa]
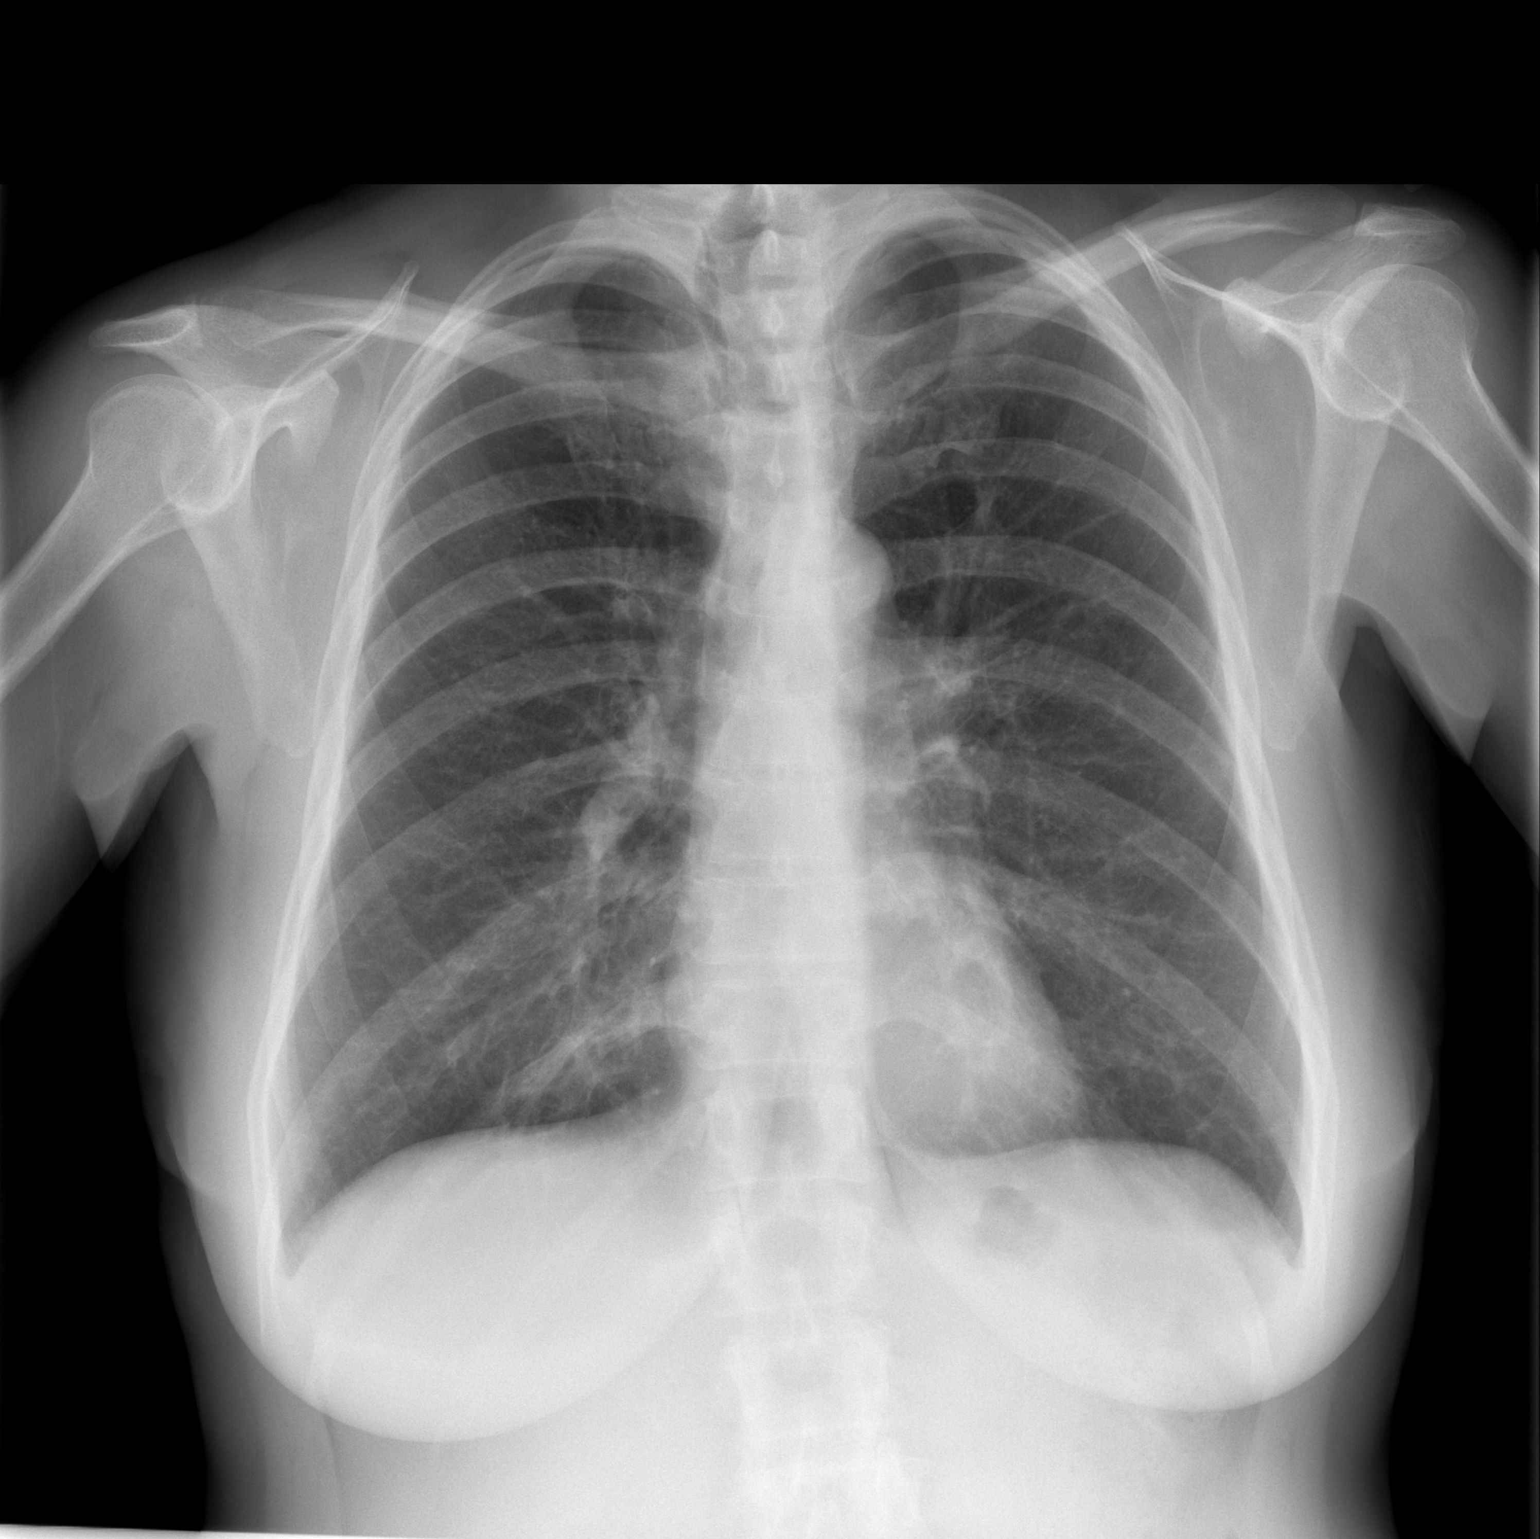

[w chest lat]
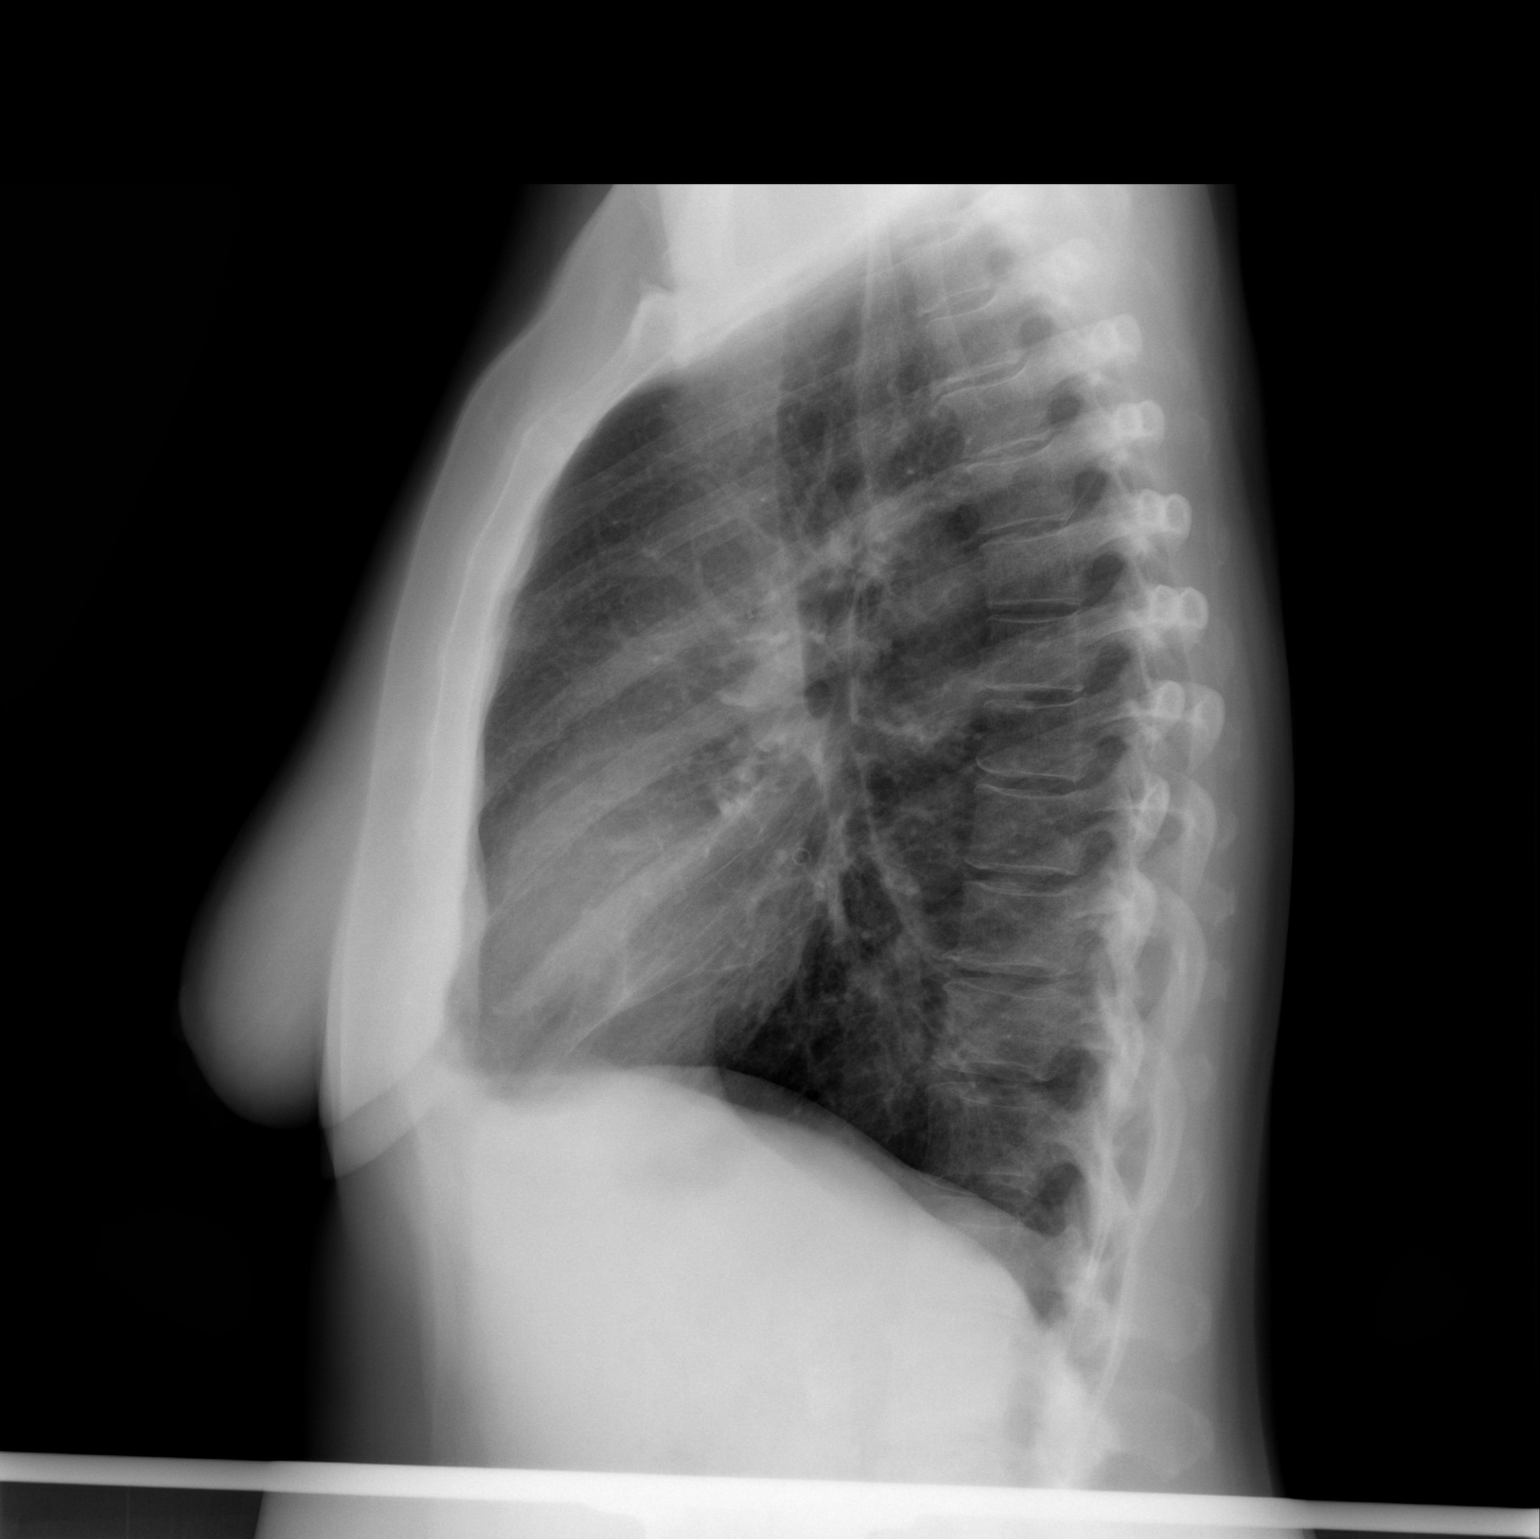

[2 of 2 positions shown; findings below may reference images not displayed]

FINDINGS: The lungs are clear.  Mediastinal contours are stable.
The heart is within normal limits in size.  No bony abnormality is
seen.
IMPRESSION: No active lung disease.

## 2012-02-15 ENCOUNTER — Encounter (HOSPITAL_COMMUNITY): Payer: Self-pay | Admitting: *Deleted

## 2012-02-15 ENCOUNTER — Emergency Department (HOSPITAL_COMMUNITY)
Admission: EM | Admit: 2012-02-15 | Discharge: 2012-02-15 | Disposition: A | Discharge status: Home / Self Care | Payer: 59 | Attending: Emergency Medicine | Admitting: Emergency Medicine

## 2012-02-15 DIAGNOSIS — R112 Nausea with vomiting, unspecified: Secondary | ICD-10-CM | POA: Insufficient documentation

## 2012-02-15 DIAGNOSIS — Z8739 Personal history of other diseases of the musculoskeletal system and connective tissue: Secondary | ICD-10-CM | POA: Insufficient documentation

## 2012-02-15 DIAGNOSIS — R509 Fever, unspecified: Secondary | ICD-10-CM | POA: Insufficient documentation

## 2012-02-15 DIAGNOSIS — E119 Type 2 diabetes mellitus without complications: Secondary | ICD-10-CM | POA: Insufficient documentation

## 2012-02-15 DIAGNOSIS — Z79899 Other long term (current) drug therapy: Secondary | ICD-10-CM | POA: Insufficient documentation

## 2012-02-15 DIAGNOSIS — R05 Cough: Secondary | ICD-10-CM | POA: Insufficient documentation

## 2012-02-15 DIAGNOSIS — N390 Urinary tract infection, site not specified: Secondary | ICD-10-CM | POA: Insufficient documentation

## 2012-02-15 DIAGNOSIS — Z862 Personal history of diseases of the blood and blood-forming organs and certain disorders involving the immune mechanism: Secondary | ICD-10-CM | POA: Insufficient documentation

## 2012-02-15 DIAGNOSIS — B349 Viral infection, unspecified: Secondary | ICD-10-CM

## 2012-02-15 DIAGNOSIS — B9789 Other viral agents as the cause of diseases classified elsewhere: Secondary | ICD-10-CM | POA: Insufficient documentation

## 2012-02-15 DIAGNOSIS — R350 Frequency of micturition: Secondary | ICD-10-CM | POA: Insufficient documentation

## 2012-02-15 DIAGNOSIS — R5381 Other malaise: Secondary | ICD-10-CM | POA: Insufficient documentation

## 2012-02-15 DIAGNOSIS — R059 Cough, unspecified: Secondary | ICD-10-CM | POA: Insufficient documentation

## 2012-02-15 DIAGNOSIS — Z8639 Personal history of other endocrine, nutritional and metabolic disease: Secondary | ICD-10-CM | POA: Insufficient documentation

## 2012-02-15 DIAGNOSIS — F17200 Nicotine dependence, unspecified, uncomplicated: Secondary | ICD-10-CM | POA: Insufficient documentation

## 2012-02-15 DIAGNOSIS — R5383 Other fatigue: Secondary | ICD-10-CM

## 2012-02-15 HISTORY — DX: Unspecified osteoarthritis, unspecified site: M19.90

## 2012-02-15 LAB — CBC
HCT: 37.7 % (ref 36.0–46.0)
Hemoglobin: 13.2 g/dL (ref 12.0–15.0)
MCH: 31.9 pg (ref 26.0–34.0)
MCHC: 35 g/dL (ref 30.0–36.0)
MCV: 91.1 fL (ref 78.0–100.0)
Platelets: 253 10*3/uL (ref 150–400)
RBC: 4.14 MIL/uL (ref 3.87–5.11)
RDW: 14.8 % (ref 11.5–15.5)
WBC: 17 10*3/uL — ABNORMAL HIGH (ref 4.0–10.5)

## 2012-02-15 LAB — URINALYSIS, ROUTINE W REFLEX MICROSCOPIC
Bilirubin Urine: NEGATIVE
Glucose, UA: NEGATIVE mg/dL
Ketones, ur: NEGATIVE mg/dL
Nitrite: NEGATIVE
Protein, ur: 100 mg/dL — AB
Specific Gravity, Urine: 1.016 (ref 1.005–1.030)
Urobilinogen, UA: 1 mg/dL (ref 0.0–1.0)
pH: 7 (ref 5.0–8.0)

## 2012-02-15 LAB — COMPREHENSIVE METABOLIC PANEL
ALT: 65 U/L — ABNORMAL HIGH (ref 0–35)
AST: 38 U/L — ABNORMAL HIGH (ref 0–37)
Albumin: 2.7 g/dL — ABNORMAL LOW (ref 3.5–5.2)
Alkaline Phosphatase: 231 U/L — ABNORMAL HIGH (ref 39–117)
BUN: 15 mg/dL (ref 6–23)
CO2: 28 mEq/L (ref 19–32)
Calcium: 9.1 mg/dL (ref 8.4–10.5)
Chloride: 94 mEq/L — ABNORMAL LOW (ref 96–112)
Creatinine, Ser: 0.82 mg/dL (ref 0.50–1.10)
GFR calc Af Amer: >90 mL/min (ref >90)
GFR calc non Af Amer: 80 mL/min — ABNORMAL LOW (ref >90)
Glucose, Bld: 105 mg/dL — ABNORMAL HIGH (ref 70–99)
Potassium: 3.4 mEq/L — ABNORMAL LOW (ref 3.5–5.1)
Sodium: 133 mEq/L — ABNORMAL LOW (ref 135–145)
Total Bilirubin: 0.7 mg/dL (ref 0.3–1.2)
Total Protein: 7.2 g/dL (ref 6.0–8.3)

## 2012-02-15 LAB — URINE MICROSCOPIC-ADD ON

## 2012-02-15 LAB — LIPASE, BLOOD: Lipase: 9 U/L — ABNORMAL LOW (ref 11–59)

## 2012-02-15 MED ORDER — CIPROFLOXACIN HCL 500 MG PO TABS: 500.0000 mg | ORAL_TABLET | Freq: Two times a day (BID) | ORAL | Status: DC

## 2012-02-15 MED ORDER — HYDROMORPHONE HCL PF 1 MG/ML IJ SOLN
1.0000 mg | Freq: Once | INTRAMUSCULAR | Status: AC
  Administered 2012-02-15: 1 mg via INTRAVENOUS
  Filled 2012-02-15: qty 1

## 2012-02-15 MED ORDER — SODIUM CHLORIDE 0.9 % IV BOLUS (SEPSIS)
1000.0000 mL | Freq: Once | INTRAVENOUS | Status: AC
  Administered 2012-02-15: 1000 mL via INTRAVENOUS

## 2012-02-15 MED ORDER — ONDANSETRON HCL 4 MG PO TABS: 4.0000 mg | ORAL_TABLET | Freq: Four times a day (QID) | ORAL | Status: DC

## 2012-02-15 MED ORDER — ONDANSETRON HCL 4 MG/2ML IJ SOLN
4.0000 mg | Freq: Once | INTRAMUSCULAR | Status: AC
  Administered 2012-02-15: 4 mg via INTRAVENOUS
  Filled 2012-02-15: qty 2

## 2012-02-15 MED ORDER — CEFTRIAXONE SODIUM 1 G IJ SOLR
1.0000 g | Freq: Once | INTRAMUSCULAR | Status: AC
  Administered 2012-02-15: 1 g via INTRAVENOUS
  Filled 2012-02-15: qty 10

## 2012-02-15 NOTE — ED Provider Notes (Signed)
History    51yF with general malaise and myalgias. Onset Friday. Persistent since. Hurts "everywhere." Nausea and vomiting. Non productive cough. No SOB. No unusual leg pain or swelling. Subjective fever. Urinary frequency w/o dysuria or hematuria. No rash. No sick contacts. No diarrhea. Smoker. Diabetic.    CSN: [id number]  Arrival date & time 02/15/12  4098   First MD Initiated Contact with Patient 02/15/12 [phone]      Chief Complaint  Patient presents with  . Generalized Body Aches  . Emesis    (Consider location/radiation/quality/duration/timing/severity/associated sxs/prior treatment) HPI  Past Medical History  Diagnosis Date  . Diabetes mellitus without complication   . Arthritis   . Thyroid disease     Past Surgical History  Procedure Date  . Knee repleacement   . Ruptured disc     History reviewed. No pertinent family history.  History  Substance Use Topics  . Smoking status: Current Every Day Smoker -- 1.0 packs/day  . Smokeless tobacco: Not on file  . Alcohol Use: Yes     Comment: rarely    OB History    Grav Para Term Preterm Abortions TAB SAB Ect Mult Living                  Review of Systems  All systems reviewed and negative, other than as noted in HPI.   Allergies  Review of patient's allergies indicates no known allergies.  Home Medications   Current Outpatient Rx  Name  Route  Sig  Dispense  Refill  . ALENDRONATE SODIUM 70 MG PO TABS   Oral   Take 70 mg by mouth every 7 (seven) days. Friday.Take with a full glass of water on an empty stomach.         Doreatha Martin COLD & FLU PO   Oral   Take 30 mLs by mouth 2 (two) times daily as needed. For cold & flu symptoms         . METFORMIN HCL 850 MG PO TABS   Oral   Take 425 mg by mouth daily with breakfast.         . VITAMIN D (ERGOCALCIFEROL) 50000 UNITS PO CAPS   Oral   Take 50,000 Units by mouth every 7 (seven) days. Friday           BP 120/69  Pulse 96  Temp 97.6 F  (36.4 C) (Oral)  Resp 20  Ht 5\' 2"  (1.575 m)  Wt 133 lb (60.328 kg)  BMI 24.33 kg/m2  SpO2 96%  Physical Exam  Nursing note and vitals reviewed. Constitutional: She is oriented to person, place, and time. She appears well-developed and well-nourished. No distress.  HENT:  Head: Normocephalic and atraumatic.  Right Ear: External ear normal.  Left Ear: External ear normal.  Nose: Nose normal.  Mouth/Throat: Oropharynx is clear and moist. No oropharyngeal exudate.  Eyes: Conjunctivae normal are normal. Right eye exhibits no discharge. Left eye exhibits no discharge.  Neck: Normal range of motion. Neck supple.  Cardiovascular: Normal rate, regular rhythm and normal heart sounds.  Exam reveals no gallop and no friction rub.   No murmur heard. Pulmonary/Chest: Effort normal and breath sounds normal. No respiratory distress.  Abdominal: Soft. She exhibits no distension and no mass. There is tenderness. There is no rebound and no guarding.       Mild epigastric tenderness w/o guarding/rebound  Musculoskeletal: She exhibits no edema and no tenderness.       Lower extremities symmetric  as compared to each other. No calf tenderness. Negative Homan's. No palpable cords.   Neurological: She is alert and oriented to person, place, and time. She exhibits normal muscle tone.  Skin: Skin is warm and dry. She is not diaphoretic.  Psychiatric: She has a normal mood and affect. Her behavior is normal. Thought content normal.    ED Course  Procedures (including critical care time)  Labs Reviewed  COMPREHENSIVE METABOLIC PANEL - Abnormal; Notable for the following:    Sodium 133 (*)     Potassium 3.4 (*)     Chloride 94 (*)     Glucose, Bld 105 (*)     Albumin 2.7 (*)     AST 38 (*)     ALT 65 (*)     Alkaline Phosphatase 231 (*)     GFR calc non Af Amer 80 (*)     All other components within normal limits  CBC - Abnormal; Notable for the following:    WBC 17.0 (*)     All other components  within normal limits  URINALYSIS, ROUTINE W REFLEX MICROSCOPIC - Abnormal; Notable for the following:    Hgb urine dipstick MODERATE (*)     Protein, ur 100 (*)     Leukocytes, UA MODERATE (*)     All other components within normal limits  URINE MICROSCOPIC-ADD ON - Abnormal; Notable for the following:    Squamous Epithelial / LPF FEW (*)     Bacteria, UA FEW (*)     All other components within normal limits  LIPASE, BLOOD - Abnormal; Notable for the following:    Lipase 9 (*)     All other components within normal limits  URINE CULTURE   No results found.   1. Malaise and fatigue   2. Viral illness   3. UTI (urinary tract infection)   4. Abnormal LFTs    MDM  51yF with body aches and fatigue. Possible viral illness. UA suggestive of infection and given urinary frequency will tx. Culture sent. Midl epigastric tenderness. Suspect from pt vomiting. Not peritoneal. Lipase normal. Pt with mild elevations in LFTs. Discussed with pt and also the need to have this followed up.        Raeford Razor, MD 02/15/12 [phone]

## 2012-02-16 LAB — URINE CULTURE: Colony Count: >=100000

## 2012-02-17 NOTE — ED Notes (Signed)
+   Urine Patient treated with Cipro-sensitive to same-chart appended per protocol MD. 

## 2012-07-11 ENCOUNTER — Other Ambulatory Visit: Payer: Self-pay | Admitting: Otolaryngology

## 2012-07-11 ENCOUNTER — Other Ambulatory Visit: Payer: Self-pay | Admitting: *Deleted

## 2012-07-11 DIAGNOSIS — R921 Mammographic calcification found on diagnostic imaging of breast: Secondary | ICD-10-CM

## 2012-07-14 ENCOUNTER — Emergency Department (HOSPITAL_COMMUNITY)
Admission: EM | Admit: 2012-07-14 | Discharge: 2012-07-14 | Disposition: A | Discharge status: Home / Self Care | Payer: 59 | Attending: Emergency Medicine | Admitting: Emergency Medicine

## 2012-07-14 ENCOUNTER — Encounter (HOSPITAL_COMMUNITY): Payer: Self-pay

## 2012-07-14 DIAGNOSIS — Z8739 Personal history of other diseases of the musculoskeletal system and connective tissue: Secondary | ICD-10-CM | POA: Insufficient documentation

## 2012-07-14 DIAGNOSIS — Z862 Personal history of diseases of the blood and blood-forming organs and certain disorders involving the immune mechanism: Secondary | ICD-10-CM | POA: Insufficient documentation

## 2012-07-14 DIAGNOSIS — Z8639 Personal history of other endocrine, nutritional and metabolic disease: Secondary | ICD-10-CM | POA: Insufficient documentation

## 2012-07-14 DIAGNOSIS — Z79899 Other long term (current) drug therapy: Secondary | ICD-10-CM | POA: Insufficient documentation

## 2012-07-14 DIAGNOSIS — L02212 Cutaneous abscess of back [any part, except buttock]: Secondary | ICD-10-CM

## 2012-07-14 DIAGNOSIS — F17200 Nicotine dependence, unspecified, uncomplicated: Secondary | ICD-10-CM | POA: Insufficient documentation

## 2012-07-14 DIAGNOSIS — E119 Type 2 diabetes mellitus without complications: Secondary | ICD-10-CM | POA: Insufficient documentation

## 2012-07-14 DIAGNOSIS — Z91040 Latex allergy status: Secondary | ICD-10-CM | POA: Insufficient documentation

## 2012-07-14 DIAGNOSIS — L02219 Cutaneous abscess of trunk, unspecified: Secondary | ICD-10-CM | POA: Insufficient documentation

## 2012-07-14 MED ORDER — HYDROCODONE-ACETAMINOPHEN 5-325 MG PO TABS: 1.0000 | ORAL_TABLET | Freq: Four times a day (QID) | ORAL | Status: DC | PRN

## 2012-07-14 MED ORDER — HYDROMORPHONE HCL PF 1 MG/ML IJ SOLN
1.0000 mg | Freq: Once | INTRAMUSCULAR | Status: AC
  Administered 2012-07-14: 1 mg via INTRAMUSCULAR
  Filled 2012-07-14: qty 1

## 2012-07-14 MED ORDER — SULFAMETHOXAZOLE-TRIMETHOPRIM 800-160 MG PO TABS
2.0000 | ORAL_TABLET | Freq: Two times a day (BID) | ORAL | Status: DC
Start: 2012-07-14 — End: 2012-07-16

## 2012-07-14 NOTE — ED Notes (Signed)
Pt c/o boil to mid back. Symptoms since last Sunday. Pt has about a 6cm large red raised area to mid back. Pt arrives with dressing to boil. Pt has placed a potato slice under the dressing because she believes it will draw the wound "to a head.".

## 2012-07-14 NOTE — ED Notes (Signed)
She has a sore lesion at left upper back area on which she has a bandaide.  It appears to be a boil--no drng. At site/no poen area(s)

## 2012-07-14 NOTE — ED Provider Notes (Signed)
Medical screening examination/treatment/procedure(s) were performed by non-physician practitioner and as supervising physician I was immediately available for consultation/collaboration.    Ariel Dimitri R Umeka Wrench, MD 07/14/12 1936 

## 2012-07-14 NOTE — ED Provider Notes (Signed)
History  This chart was scribed for Ebbie Ridge PA-C, a non-physician practitioner working with Celene Kras, MD by Lewanda Rife, ED Scribe. This patient was seen in room WTR6/WTR6 and the patient's care was started at 1613.     CSN: [id number]  Arrival date & time 07/14/12  1440   First MD Initiated Contact with Patient 07/14/12 1549      Chief Complaint  Patient presents with  . Abscess    (Consider location/radiation/quality/duration/timing/severity/associated sxs/prior treatment) The history is provided by the patient.   HPI Comments: Karla Baker is a 52 y.o. female who presents to the Emergency Department complaining of worsening skin infection on mid-back onset 7 days. Reports constant moderate pain to site without radiation. Denies fever, and other sites of skin infection. Denies any aggravating or alleviating factors. Reports trying potatoes, "fat back", and ice with no relief of symptoms.   Past Medical History  Diagnosis Date  . Diabetes mellitus without complication   . Arthritis   . Thyroid disease     Past Surgical History  Procedure Laterality Date  . Knee repleacement    . Ruptured disc      History reviewed. No pertinent family history.  History  Substance Use Topics  . Smoking status: Current Every Day Smoker -- 1.00 packs/day  . Smokeless tobacco: Not on file  . Alcohol Use: Yes     Comment: rarely    OB History   Grav Para Term Preterm Abortions TAB SAB Ect Mult Living                  Review of Systems  Constitutional: Negative for fever.  Skin:       Abscess   All other systems reviewed and are negative.  A complete 10 system review of systems was obtained and all systems are negative except as noted in the HPI and PMH.    Allergies  Latex  Home Medications   Current Outpatient Rx  Name  Route  Sig  Dispense  Refill  . alendronate (FOSAMAX) 70 MG tablet   Oral   Take 70 mg by mouth every 7 (seven) days.  Friday.Take with a full glass of water on an empty stomach.         . ALPRAZolam (XANAX) 1 MG tablet   Oral   Take 1 mg by mouth 3 (three) times daily as needed for anxiety.         . diclofenac (VOLTAREN) 75 MG EC tablet   Oral   Take 75 mg by mouth 2 (two) times daily.         . metFORMIN (GLUCOPHAGE) 850 MG tablet   Oral   Take 425 mg by mouth daily with breakfast.           BP 121/79  Pulse 108  Temp(Src) 98.4 F (36.9 C) (Oral)  Resp 16  SpO2 99%  Physical Exam  Nursing note and vitals reviewed. Constitutional: She is oriented to person, place, and time. She appears well-developed and well-nourished. No distress.  HENT:  Head: Normocephalic and atraumatic.  Eyes: EOM are normal.  Neck: Neck supple. No tracheal deviation present.  Cardiovascular: Normal rate.   Pulmonary/Chest: Effort normal. No respiratory distress.  Musculoskeletal: Normal range of motion.  Neurological: She is alert and oriented to person, place, and time.  Skin: Skin is warm and dry.     Pain over site, with fluctuance and warmth. 5 cm in diameter  Psychiatric: She has  a normal mood and affect. Her behavior is normal.    ED Course  Procedures (including critical care time) INCISION AND DRAINAGE Performed by: Carlyle Dolly Consent: Verbal consent obtained. Risks and benefits: risks, benefits and alternatives were discussed Type: abscess  Body area: L ThoracicRegion Incision was made with a scalpel.  Local anesthetic: lidocaine 2%w/o epinephrine  Anesthetic total:10 ml  Complexity: complex Blunt dissection to break up loculations  Drainage: purulent  Drainage amount: large  Packing material: 1/4 in iodoform gauze  Patient tolerance: Patient tolerated the procedure well with no immediate complications.   Return here in 2 days for a recheck. Use heat around the area.   MDM  I personally performed the services described in this documentation, which was scribed  in my presence. The recorded information has been reviewed and is accurate.    Carlyle Dolly, PA-C 07/14/12 1717

## 2012-07-16 ENCOUNTER — Encounter (HOSPITAL_COMMUNITY): Payer: Self-pay | Admitting: Emergency Medicine

## 2012-07-16 ENCOUNTER — Emergency Department (HOSPITAL_COMMUNITY)
Admission: EM | Admit: 2012-07-16 | Discharge: 2012-07-16 | Disposition: A | Discharge status: Home / Self Care | Payer: 59 | Attending: Emergency Medicine | Admitting: Emergency Medicine

## 2012-07-16 DIAGNOSIS — E119 Type 2 diabetes mellitus without complications: Secondary | ICD-10-CM | POA: Insufficient documentation

## 2012-07-16 DIAGNOSIS — L02212 Cutaneous abscess of back [any part, except buttock]: Secondary | ICD-10-CM

## 2012-07-16 DIAGNOSIS — Z79899 Other long term (current) drug therapy: Secondary | ICD-10-CM | POA: Insufficient documentation

## 2012-07-16 DIAGNOSIS — N764 Abscess of vulva: Secondary | ICD-10-CM

## 2012-07-16 DIAGNOSIS — R509 Fever, unspecified: Secondary | ICD-10-CM | POA: Insufficient documentation

## 2012-07-16 DIAGNOSIS — R5383 Other fatigue: Secondary | ICD-10-CM | POA: Insufficient documentation

## 2012-07-16 DIAGNOSIS — Z8739 Personal history of other diseases of the musculoskeletal system and connective tissue: Secondary | ICD-10-CM | POA: Insufficient documentation

## 2012-07-16 DIAGNOSIS — R5381 Other malaise: Secondary | ICD-10-CM | POA: Insufficient documentation

## 2012-07-16 DIAGNOSIS — F17200 Nicotine dependence, unspecified, uncomplicated: Secondary | ICD-10-CM | POA: Insufficient documentation

## 2012-07-16 DIAGNOSIS — L02219 Cutaneous abscess of trunk, unspecified: Secondary | ICD-10-CM | POA: Insufficient documentation

## 2012-07-16 DIAGNOSIS — E079 Disorder of thyroid, unspecified: Secondary | ICD-10-CM | POA: Insufficient documentation

## 2012-07-16 MED ORDER — CLINDAMYCIN PHOSPHATE 900 MG/50ML IV SOLN
900.0000 mg | Freq: Once | INTRAVENOUS | Status: AC
  Administered 2012-07-16: 900 mg via INTRAVENOUS
  Filled 2012-07-16: qty 50

## 2012-07-16 MED ORDER — ONDANSETRON HCL 4 MG/2ML IJ SOLN
4.0000 mg | Freq: Once | INTRAMUSCULAR | Status: AC
  Administered 2012-07-16: 4 mg via INTRAVENOUS
  Filled 2012-07-16: qty 2

## 2012-07-16 MED ORDER — LIDOCAINE HCL (PF) 1 % IJ SOLN
5.0000 mL | Freq: Once | INTRAMUSCULAR | Status: AC
  Administered 2012-07-16: 5 mL
  Filled 2012-07-16: qty 5

## 2012-07-16 MED ORDER — MORPHINE SULFATE 4 MG/ML IJ SOLN
6.0000 mg | Freq: Once | INTRAMUSCULAR | Status: AC
  Administered 2012-07-16: 6 mg via INTRAVENOUS
  Filled 2012-07-16: qty 2

## 2012-07-16 NOTE — ED Notes (Signed)
Abscess covered with gauze dressing. Skin surrounding dressing appears red and warm to touch.

## 2012-07-16 NOTE — ED Notes (Signed)
Pt states she was here on Sunday and had her abscess on her back drained.  States that it has gotten bigger and she has been running a fever.  States that it looks like it is spreading.

## 2012-07-16 NOTE — ED Provider Notes (Signed)
INCISION AND DRAINAGE Performed by: Francee Piccolo L Consent: Verbal consent obtained. Risks and benefits: risks, benefits and alternatives were discussed Type: abscess  Body area: right vulvar region  Anesthesia: local infiltration  Incision was made with a scalpel.  Local anesthetic: lidocaine 1 % w/o epinephrine  Anesthetic total: 3 ml  Complexity: complex Blunt dissection to break up loculations  Drainage: purulent  Drainage amount: copious  Packing material: 1/4 in iodoform gauze  Patient tolerance: Patient tolerated the procedure well with no immediate complications.     Jeannetta Ellis, PA-C 07/16/12 1138

## 2012-07-16 NOTE — ED Notes (Signed)
Pt states she had temp of 101.3 yesterday, took tylenol which reduced fever

## 2012-07-16 NOTE — ED Provider Notes (Signed)
Medical screening examination/treatment/procedure(s) were conducted as a shared visit with non-physician practitioner(s) and myself.  I personally evaluated the patient during the encounter   Rolan Bucco, MD 07/16/12 1451

## 2012-07-16 NOTE — ED Provider Notes (Signed)
History     CSN: [id number]  Arrival date & time 07/16/12  7829   First MD Initiated Contact with Patient 07/16/12 [phone]      Chief Complaint  Patient presents with  . Abscess    (Consider location/radiation/quality/duration/timing/severity/associated sxs/prior treatment) HPI Comments: Patient was seen here 2 days ago for an abscess on her back. This was I&D. She was started on antibiotics but states that she just started yesterday. She does have a history of diabetes. She states that since that time she feels like the swelling got worse and she had a fever 101 yesterday. She denies any nausea or vomiting. She denies any past history of abscesses. She has constant throbbing worsening pain to the area.  Patient is a 52 y.o. female presenting with abscess.  Abscess Associated symptoms: fatigue and fever   Associated symptoms: no headaches, no nausea and no vomiting     Past Medical History  Diagnosis Date  . Diabetes mellitus without complication   . Arthritis   . Thyroid disease     Past Surgical History  Procedure Laterality Date  . Knee repleacement    . Ruptured disc      No family history on file.  History  Substance Use Topics  . Smoking status: Current Every Day Smoker -- 1.00 packs/day  . Smokeless tobacco: Not on file  . Alcohol Use: Yes     Comment: rarely    OB History   Grav Para Term Preterm Abortions TAB SAB Ect Mult Living                  Review of Systems  Constitutional: Positive for fever and fatigue. Negative for chills and diaphoresis.  HENT: Negative for congestion, rhinorrhea, sneezing and neck pain.   Eyes: Negative.   Respiratory: Negative for cough, chest tightness and shortness of breath.   Cardiovascular: Negative for chest pain and leg swelling.  Gastrointestinal: Negative for nausea, vomiting, abdominal pain, diarrhea and blood in stool.  Genitourinary: Negative for frequency, hematuria, flank pain and difficulty urinating.   Musculoskeletal: Negative for back pain, joint swelling and arthralgias.  Skin: Positive for wound (abscess to back). Negative for rash.  Neurological: Negative for dizziness, speech difficulty, weakness, numbness and headaches.    Allergies  Latex  Home Medications   Current Outpatient Rx  Name  Route  Sig  Dispense  Refill  . acetaminophen (TYLENOL) 500 MG tablet   Oral   Take 1,000 mg by mouth every 6 (six) hours as needed for pain.          Marland Kitchen alendronate (FOSAMAX) 70 MG tablet   Oral   Take 70 mg by mouth every Friday.          . ALPRAZolam (XANAX) 1 MG tablet   Oral   Take 1 mg by mouth 3 (three) times daily as needed for anxiety.         . diclofenac (VOLTAREN) 75 MG EC tablet   Oral   Take 75 mg by mouth 2 (two) times daily.         Marland Kitchen HYDROcodone-acetaminophen (NORCO/VICODIN) 5-325 MG per tablet   Oral   Take 1 tablet by mouth every 6 (six) hours as needed for pain.   15 tablet   0   . metFORMIN (GLUCOPHAGE) 850 MG tablet   Oral   Take 425 mg by mouth daily with breakfast.         . sulfamethoxazole-trimethoprim (BACTRIM DS) 800-160 MG per  tablet   Oral   Take 2 tablets by mouth 2 (two) times daily. She is taking for 10 days. She started on 07/14/12 and has not completed.           BP 130/67  Pulse 126  Temp(Src) 97.4 F (36.3 C) (Oral)  Resp 16  SpO2 99%  Physical Exam  Constitutional: She is oriented to person, place, and time. She appears well-developed and well-nourished.  HENT:  Head: Normocephalic and atraumatic.  Eyes: Pupils are equal, round, and reactive to light.  Neck: Normal range of motion. Neck supple.  Cardiovascular: Normal rate, regular rhythm and normal heart sounds.   Pulmonary/Chest: Effort normal and breath sounds normal. No respiratory distress. She has no wheezes. She has no rales. She exhibits no tenderness.  Abdominal: Soft. Bowel sounds are normal. There is no tenderness. There is no rebound and no guarding.   Musculoskeletal: Normal range of motion. She exhibits no edema.  Lymphadenopathy:    She has no cervical adenopathy.  Neurological: She is alert and oriented to person, place, and time.  Skin: Skin is warm and dry. No rash noted.  Patient has an open wound on her left upper back. There is packing in place and there is no drainage around the packing. She has a large erythematous indurated area about 10 cm in diameter across her upper back into the lateral axilla.  Psychiatric: She has a normal mood and affect.    ED Course  Procedures (including critical care time)  Labs Reviewed - No data to display No results found.   1. Vulvar abscess   2. Back abscess       MDM  Patient's abscess on her back was well open and draining. I was able to express a large amount of pus from the area. She was given a dose of IV antibiotics. I feel it's been drained better since the packing was removed. While she was here she did notify me of the second abscesses she has on her profile. She has a 1 cm fluctuant abscess with no signs of surrounding cellulitis to her right vulva. This was opened by the P.A. She is advised to continue her Bactrim. She states she are he has pain medicine at home. She was advised to use warm compresses to the areas. She was advised to have her wound rechecked in 2 days by her primary care physician or she can get in to come back here for recheck.        Rolan Bucco, MD 07/16/12 [phone]

## 2012-07-19 ENCOUNTER — Inpatient Hospital Stay (HOSPITAL_COMMUNITY)
Admission: AD | Admit: 2012-07-19 | Discharge: 2012-07-23 | DRG: 603 | Disposition: A | Discharge status: Home / Self Care | Payer: 59 | Source: Ambulatory Visit | Attending: Surgery | Admitting: Surgery

## 2012-07-19 ENCOUNTER — Ambulatory Visit (INDEPENDENT_AMBULATORY_CARE_PROVIDER_SITE_OTHER): Payer: 59 | Admitting: General Surgery

## 2012-07-19 ENCOUNTER — Encounter (INDEPENDENT_AMBULATORY_CARE_PROVIDER_SITE_OTHER): Payer: Self-pay | Admitting: General Surgery

## 2012-07-19 ENCOUNTER — Other Ambulatory Visit (INDEPENDENT_AMBULATORY_CARE_PROVIDER_SITE_OTHER): Payer: Self-pay | Admitting: General Surgery

## 2012-07-19 ENCOUNTER — Encounter (HOSPITAL_COMMUNITY): Payer: Self-pay | Admitting: General Practice

## 2012-07-19 VITALS — BP 132/70 | HR 96 | Temp 97.3°F | Resp 20 | Ht 62.5 in | Wt 133.0 lb

## 2012-07-19 DIAGNOSIS — M129 Arthropathy, unspecified: Secondary | ICD-10-CM | POA: Diagnosis present

## 2012-07-19 DIAGNOSIS — F17200 Nicotine dependence, unspecified, uncomplicated: Secondary | ICD-10-CM | POA: Diagnosis present

## 2012-07-19 DIAGNOSIS — L02219 Cutaneous abscess of trunk, unspecified: Secondary | ICD-10-CM

## 2012-07-19 DIAGNOSIS — A4902 Methicillin resistant Staphylococcus aureus infection, unspecified site: Secondary | ICD-10-CM | POA: Diagnosis present

## 2012-07-19 DIAGNOSIS — L02212 Cutaneous abscess of back [any part, except buttock]: Secondary | ICD-10-CM | POA: Insufficient documentation

## 2012-07-19 DIAGNOSIS — E119 Type 2 diabetes mellitus without complications: Secondary | ICD-10-CM | POA: Diagnosis present

## 2012-07-19 DIAGNOSIS — L03319 Cellulitis of trunk, unspecified: Secondary | ICD-10-CM

## 2012-07-19 HISTORY — DX: Type 2 diabetes mellitus without complications: E11.9

## 2012-07-19 HISTORY — DX: Migraine, unspecified, not intractable, without status migrainosus: G43.909

## 2012-07-19 HISTORY — DX: Low back pain: M54.5

## 2012-07-19 HISTORY — DX: Pneumonia, unspecified organism: J18.9

## 2012-07-19 HISTORY — DX: Other chronic pain: G89.29

## 2012-07-19 HISTORY — DX: Pure hypercholesterolemia, unspecified: E78.00

## 2012-07-19 HISTORY — DX: Low back pain, unspecified: M54.50

## 2012-07-19 HISTORY — DX: Thyrotoxicosis, unspecified without thyrotoxic crisis or storm: E05.90

## 2012-07-19 HISTORY — DX: Sleep apnea, unspecified: G47.30

## 2012-07-19 HISTORY — DX: Furuncle of back (any part, except buttock): L02.222

## 2012-07-19 LAB — BASIC METABOLIC PANEL
CO2: 28 mEq/L (ref 19–32)
Calcium: 9.9 mg/dL (ref 8.4–10.5)
Chloride: 98 mEq/L (ref 96–112)
GFR calc Af Amer: 85 mL/min — ABNORMAL LOW (ref >90)
Sodium: 136 mEq/L (ref 135–145)

## 2012-07-19 LAB — CBC
HCT: 37.7 % (ref 36.0–46.0)
MCV: 90.4 fL (ref 78.0–100.0)
Platelets: 390 10*3/uL (ref 150–400)
RBC: 4.17 MIL/uL (ref 3.87–5.11)
RDW: 14.2 % (ref 11.5–15.5)
WBC: 9.8 10*3/uL (ref 4.0–10.5)

## 2012-07-19 MED ORDER — ACETAMINOPHEN 325 MG PO TABS
650.0000 mg | ORAL_TABLET | Freq: Four times a day (QID) | ORAL | Status: DC | PRN
  Filled 2012-07-19: qty 2

## 2012-07-19 MED ORDER — INSULIN ASPART 100 UNIT/ML ~~LOC~~ SOLN: 0.0000 [IU] | Freq: Three times a day (TID) | SUBCUTANEOUS | Status: DC

## 2012-07-19 MED ORDER — PIPERACILLIN-TAZOBACTAM 3.375 G IVPB
3.3750 g | Freq: Three times a day (TID) | INTRAVENOUS | Status: DC
  Administered 2012-07-19 – 2012-07-22 (×7): 3.375 g via INTRAVENOUS
  Filled 2012-07-19 (×12): qty 50

## 2012-07-19 MED ORDER — ONDANSETRON HCL 4 MG/2ML IJ SOLN: 4.0000 mg | Freq: Four times a day (QID) | INTRAMUSCULAR | Status: DC | PRN

## 2012-07-19 MED ORDER — SODIUM CHLORIDE 0.45 % IV SOLN
INTRAVENOUS | Status: DC
  Administered 2012-07-19 – 2012-07-21 (×2): via INTRAVENOUS
  Filled 2012-07-19 (×7): qty 1000

## 2012-07-19 MED ORDER — ACETAMINOPHEN 650 MG RE SUPP: 650.0000 mg | Freq: Four times a day (QID) | RECTAL | Status: DC | PRN

## 2012-07-19 MED ORDER — MORPHINE SULFATE 2 MG/ML IJ SOLN
2.0000 mg | INTRAMUSCULAR | Status: DC | PRN
  Administered 2012-07-19 – 2012-07-20 (×4): 2 mg via INTRAVENOUS
  Filled 2012-07-19 (×7): qty 1

## 2012-07-19 NOTE — Progress Notes (Signed)
Patient ID: Karla Baker, female   DOB: [birth year], 52 y.o.   MRN: [id number]  Chief Complaint  Patient presents with  . URGENT    abscess on mid back    HPI Karla Baker is a 52 y.o. female.  Referred by Dr Loleta Chance HPI This is a 52 year old female with a history of diabetes and is a smoker who about 10 days ago developed a boil on her back that she popped at home. Subsequent to that this area has recurred and has been spreading. She says she been seen in the ER couple of times. Was done a small incision at the medial aspect of the this. But she still has continued increasing pain at this site. There is also increasing edema and swelling to the lateral aspect of this. She does not have a fever. This area is very tender. She is not really able to touch this at all. She comes in today referred from her primary care physician after being seen in the ER to see if this is adequately drained. Past Medical History  Diagnosis Date  . Diabetes mellitus without complication   . Arthritis   . Thyroid disease     Past Surgical History  Procedure Laterality Date  . Knee repleacement    . Ruptured disc      Family History  Problem Relation Age of Onset  . Congestive Heart Failure Mother   . Cancer Father     unknown to pt - smoking related but not lung/throat    Social History History  Substance Use Topics  . Smoking status: Current Every Day Smoker -- 1.00 packs/day  . Smokeless tobacco: Never Used  . Alcohol Use: Yes     Comment: rarely    Allergies  Allergen Reactions  . Latex Rash    Current Outpatient Prescriptions  Medication Sig Dispense Refill  . acetaminophen (TYLENOL) 500 MG tablet Take 1,000 mg by mouth every 6 (six) hours as needed for pain.       Marland Kitchen alendronate (FOSAMAX) 70 MG tablet Take 70 mg by mouth every Friday.       . ALPRAZolam (XANAX) 1 MG tablet Take 1 mg by mouth 3 (three) times daily as needed for anxiety.      . diclofenac (VOLTAREN) 75 MG EC  tablet Take 75 mg by mouth 2 (two) times daily.      Marland Kitchen HYDROcodone-acetaminophen (NORCO/VICODIN) 5-325 MG per tablet Take 1 tablet by mouth every 6 (six) hours as needed for pain.  15 tablet  0  . metFORMIN (GLUCOPHAGE) 850 MG tablet Take 425 mg by mouth daily with breakfast.      . sulfamethoxazole-trimethoprim (BACTRIM DS) 800-160 MG per tablet Take 2 tablets by mouth 2 (two) times daily. She is taking for 10 days. She started on 07/14/12 and has not completed.       No current facility-administered medications for this visit.    Review of Systems Review of Systems  Constitutional: Negative for fever, chills and unexpected weight change.  HENT: Negative for hearing loss, congestion, sore throat, trouble swallowing and voice change.   Eyes: Negative for visual disturbance.  Respiratory: Negative for cough and wheezing.   Cardiovascular: Negative for chest pain, palpitations and leg swelling.  Gastrointestinal: Negative for nausea, vomiting, abdominal pain, diarrhea, constipation, blood in stool, abdominal distention and anal bleeding.  Genitourinary: Negative for hematuria, vaginal bleeding and difficulty urinating.  Musculoskeletal: Negative for arthralgias.  Skin: Negative for rash and wound.  Neurological: Negative for seizures, syncope and headaches.  Hematological: Negative for adenopathy. Does not bruise/bleed easily.  Psychiatric/Behavioral: Negative for confusion.    Blood pressure 132/70, pulse 96, temperature 97.3 F (36.3 C), temperature source Temporal, resp. rate 20, height 5' 2.5" (1.588 m), weight 133 lb (60.328 kg).  Physical Exam Physical Exam  Vitals reviewed. Constitutional: She appears well-developed and well-nourished.  Pulmonary/Chest:      Data Reviewed Er notes  Assessment    Back abscess     Plan    I think this abscess is inadequately drained. I discussed doing this in the office she is not really amenable to that. I'm going to put her in the  hospital. I will place her on some IV antibiotics as well as a sliding scale insulin. She had this done under anesthesia tomorrow.        Kweku Stankey 07/19/2012, 3:41 PM

## 2012-07-19 NOTE — Progress Notes (Signed)
ANTIBIOTIC CONSULT NOTE - INITIAL  Pharmacy Consult for zosyn Indication: back abscess in diabetic pt  Allergies  Allergen Reactions  . Latex Rash    Patient Measurements:   Total Body Weight: 60.3 kg  Vital Signs: Temp: 98.4 F (36.9 C) (05/29 1951) Temp src: Oral (05/29 1953) BP: 102/66 mmHg (05/29 1951) Pulse Rate: 75 (05/29 1951) Intake/Output from previous day:   Intake/Output from this shift:    Labs: No results found for this basename: WBC, HGB, PLT, LABCREA, CREATININE,  in the last 72 hours The CrCl is unknown because both a height and weight (above a minimum accepted value) are required for this calculation. No results found for this basename: VANCOTROUGH, VANCOPEAK, VANCORANDOM, GENTTROUGH, GENTPEAK, GENTRANDOM, TOBRATROUGH, TOBRAPEAK, TOBRARND, AMIKACINPEAK, AMIKACINTROU, AMIKACIN,  in the last 72 hours   Microbiology: No results found for this or any previous visit (from the past 720 hour(s)).  Medical History: Past Medical History  Diagnosis Date  . Diabetes mellitus without complication   . Arthritis   . Thyroid disease     Medications:  Prescriptions prior to admission  Medication Sig Dispense Refill  . acetaminophen (TYLENOL) 500 MG tablet Take 1,000 mg by mouth every 6 (six) hours as needed for pain.       Marland Kitchen alendronate (FOSAMAX) 70 MG tablet Take 70 mg by mouth every Friday.       . ALPRAZolam (XANAX) 1 MG tablet Take 1 mg by mouth 3 (three) times daily as needed for anxiety.      . diclofenac (VOLTAREN) 75 MG EC tablet Take 75 mg by mouth 2 (two) times daily.      Marland Kitchen HYDROcodone-acetaminophen (NORCO/VICODIN) 5-325 MG per tablet Take 1 tablet by mouth every 6 (six) hours as needed for pain.  15 tablet  0  . metFORMIN (GLUCOPHAGE) 850 MG tablet Take 425 mg by mouth daily with breakfast.      . sulfamethoxazole-trimethoprim (BACTRIM DS) 800-160 MG per tablet Take 2 tablets by mouth 2 (two) times daily. She is taking for 10 days. She started on 07/14/12  and has not completed.       Assessment: Mr. Jac Canavan is a 52 yo F to start zosyn per pharmacy for a back abscess.   Her PMH is significant for DM, tobacco abuse.  She was started on Bactrim DS on 5/24 - 2 tabs bid.  She will have an I&D of back abcess in the morning. Tmax 98.4.  No labs drawn yet.   Goal of Therapy:  Eradication of infection  Plan:  1. Zosyn 3.375 gm IV q8h, infuse each dose over 4 hours 2. F/u renal function and culture data Herby Abraham, Pharm.D. [phone] 07/19/2012 8:07 PM

## 2012-07-20 ENCOUNTER — Inpatient Hospital Stay (HOSPITAL_COMMUNITY): Payer: 59 | Admitting: Anesthesiology

## 2012-07-20 ENCOUNTER — Encounter (HOSPITAL_COMMUNITY): Admission: AD | Disposition: A | Discharge status: Home / Self Care | Payer: Self-pay | Source: Ambulatory Visit

## 2012-07-20 ENCOUNTER — Encounter (HOSPITAL_COMMUNITY): Payer: Self-pay | Admitting: Anesthesiology

## 2012-07-20 HISTORY — PX: INCISION AND DRAINAGE ABSCESS: SHX5864

## 2012-07-20 LAB — GLUCOSE, CAPILLARY: Glucose-Capillary: 88 mg/dL (ref 70–99)

## 2012-07-20 SURGERY — INCISION AND DRAINAGE, ABSCESS
Anesthesia: General | Site: Back | Wound class: Dirty or Infected

## 2012-07-20 MED ORDER — HYDROMORPHONE HCL PF 1 MG/ML IJ SOLN
INTRAMUSCULAR | Status: AC
  Filled 2012-07-20: qty 1

## 2012-07-20 MED ORDER — FLUCONAZOLE 200 MG PO TABS
200.0000 mg | ORAL_TABLET | Freq: Every day | ORAL | Status: AC
  Administered 2012-07-20 – 2012-07-22 (×3): 200 mg via ORAL
  Filled 2012-07-20 (×3): qty 1

## 2012-07-20 MED ORDER — LACTATED RINGERS IV SOLN
INTRAVENOUS | Status: DC | PRN
  Administered 2012-07-20: 14:00:00 via INTRAVENOUS

## 2012-07-20 MED ORDER — PROPOFOL 10 MG/ML IV BOLUS
INTRAVENOUS | Status: DC | PRN
  Administered 2012-07-20: 200 mg via INTRAVENOUS

## 2012-07-20 MED ORDER — PROMETHAZINE HCL 25 MG/ML IJ SOLN: 6.2500 mg | INTRAMUSCULAR | Status: DC | PRN

## 2012-07-20 MED ORDER — 0.9 % SODIUM CHLORIDE (POUR BTL) OPTIME
TOPICAL | Status: DC | PRN
  Administered 2012-07-20: 1000 mL

## 2012-07-20 MED ORDER — MIDAZOLAM HCL 5 MG/5ML IJ SOLN
INTRAMUSCULAR | Status: DC | PRN
  Administered 2012-07-20: 2 mg via INTRAVENOUS

## 2012-07-20 MED ORDER — ONDANSETRON HCL 4 MG/2ML IJ SOLN
INTRAMUSCULAR | Status: DC | PRN
  Administered 2012-07-20: 4 mg via INTRAVENOUS

## 2012-07-20 MED ORDER — OXYCODONE HCL 5 MG PO TABS: 5.0000 mg | ORAL_TABLET | Freq: Once | ORAL | Status: DC | PRN

## 2012-07-20 MED ORDER — BUPIVACAINE HCL (PF) 0.25 % IJ SOLN
INTRAMUSCULAR | Status: AC
  Filled 2012-07-20: qty 30

## 2012-07-20 MED ORDER — HYDROMORPHONE HCL PF 1 MG/ML IJ SOLN: 1.0000 mg | INTRAMUSCULAR | Status: DC | PRN

## 2012-07-20 MED ORDER — OXYCODONE-ACETAMINOPHEN 5-325 MG PO TABS
1.0000 | ORAL_TABLET | ORAL | Status: DC | PRN
  Administered 2012-07-21 – 2012-07-22 (×4): 2 via ORAL
  Filled 2012-07-20 (×4): qty 2

## 2012-07-20 MED ORDER — OXYCODONE HCL 5 MG/5ML PO SOLN: 5.0000 mg | Freq: Once | ORAL | Status: DC | PRN

## 2012-07-20 MED ORDER — LIDOCAINE HCL (CARDIAC) 20 MG/ML IV SOLN
INTRAVENOUS | Status: DC | PRN
  Administered 2012-07-20: 100 mg via INTRAVENOUS

## 2012-07-20 MED ORDER — SUCCINYLCHOLINE CHLORIDE 20 MG/ML IJ SOLN
INTRAMUSCULAR | Status: DC | PRN
  Administered 2012-07-20: 100 mg via INTRAVENOUS

## 2012-07-20 MED ORDER — HYDROMORPHONE HCL PF 1 MG/ML IJ SOLN
0.2500 mg | INTRAMUSCULAR | Status: DC | PRN
  Administered 2012-07-20: 0.5 mg via INTRAVENOUS

## 2012-07-20 MED ORDER — FENTANYL CITRATE 0.05 MG/ML IJ SOLN
INTRAMUSCULAR | Status: DC | PRN
  Administered 2012-07-20 (×2): 100 ug via INTRAVENOUS
  Administered 2012-07-20: 50 ug via INTRAVENOUS

## 2012-07-20 MED ORDER — CEFAZOLIN SODIUM-DEXTROSE 2-3 GM-% IV SOLR
INTRAVENOUS | Status: AC
  Administered 2012-07-20: 2 g via INTRAVENOUS
  Filled 2012-07-20: qty 50

## 2012-07-20 SURGICAL SUPPLY — 32 items
BANDAGE GAUZE ELAST BULKY 4 IN (GAUZE/BANDAGES/DRESSINGS) IMPLANT
CANISTER SUCTION 2500CC (MISCELLANEOUS) ×2 IMPLANT
CLOTH BEACON ORANGE TIMEOUT ST (SAFETY) ×2 IMPLANT
COVER SURGICAL LIGHT HANDLE (MISCELLANEOUS) ×2 IMPLANT
DRAPE LAPAROSCOPIC ABDOMINAL (DRAPES) ×2 IMPLANT
DRAPE UTILITY 15X26 W/TAPE STR (DRAPE) ×4 IMPLANT
DRSG PAD ABDOMINAL 8X10 ST (GAUZE/BANDAGES/DRESSINGS) IMPLANT
ELECT CAUTERY BLADE 6.4 (BLADE) ×2 IMPLANT
ELECT REM PT RETURN 9FT ADLT (ELECTROSURGICAL) ×2
ELECTRODE REM PT RTRN 9FT ADLT (ELECTROSURGICAL) ×1 IMPLANT
GAUZE PACKING IODOFORM 1 (PACKING) ×1 IMPLANT
GLOVE BIO SURGEON STRL SZ7.5 (GLOVE) ×2 IMPLANT
GLOVE BIOGEL PI IND STRL 6 (GLOVE) IMPLANT
GLOVE BIOGEL PI IND STRL 7.0 (GLOVE) IMPLANT
GLOVE BIOGEL PI IND STRL 8 (GLOVE) ×1 IMPLANT
GLOVE BIOGEL PI INDICATOR 6 (GLOVE) ×1
GLOVE BIOGEL PI INDICATOR 7.0 (GLOVE) ×1
GLOVE BIOGEL PI INDICATOR 8 (GLOVE) ×1
GOWN STRL NON-REIN LRG LVL3 (GOWN DISPOSABLE) ×4 IMPLANT
KIT BASIN OR (CUSTOM PROCEDURE TRAY) ×2 IMPLANT
KIT ROOM TURNOVER OR (KITS) ×2 IMPLANT
NS IRRIG 1000ML POUR BTL (IV SOLUTION) ×2 IMPLANT
PACK GENERAL/GYN (CUSTOM PROCEDURE TRAY) ×2 IMPLANT
PAD ARMBOARD 7.5X6 YLW CONV (MISCELLANEOUS) ×2 IMPLANT
SPONGE GAUZE 4X4 12PLY (GAUZE/BANDAGES/DRESSINGS) ×2 IMPLANT
SWAB COLLECTION DEVICE MRSA (MISCELLANEOUS) ×1 IMPLANT
SYR CONTROL 10ML LL (SYRINGE) ×2 IMPLANT
TAPE CLOTH SURG 4X10 WHT LF (GAUZE/BANDAGES/DRESSINGS) ×1 IMPLANT
TAPE CLOTH SURG 6X10 WHT LF (GAUZE/BANDAGES/DRESSINGS) ×1 IMPLANT
TOWEL OR 17X24 6PK STRL BLUE (TOWEL DISPOSABLE) ×2 IMPLANT
TOWEL OR 17X26 10 PK STRL BLUE (TOWEL DISPOSABLE) ×2 IMPLANT
TUBE ANAEROBIC SPECIMEN COL (MISCELLANEOUS) ×1 IMPLANT

## 2012-07-20 NOTE — Anesthesia Postprocedure Evaluation (Signed)
Anesthesia Post Note  Patient: Karla Baker  Procedure(s) Performed: Procedure(s) (LRB): INCISION AND DRAINAGE ABSCESS (N/A)  Anesthesia type: general  Patient location: PACU  Post pain: Pain level controlled  Post assessment: Patient's Cardiovascular Status Stable  Last Vitals:  Filed Vitals:   07/20/12 1557  BP: 103/65  Pulse: 80  Temp: 36.6 C  Resp:     Post vital signs: Reviewed and stable  Level of consciousness: sedated  Complications: No apparent anesthesia complications

## 2012-07-20 NOTE — Transfer of Care (Signed)
Immediate Anesthesia Transfer of Care Note  Patient: Karla Baker  Procedure(s) Performed: Procedure(s): INCISION AND DRAINAGE ABSCESS (N/A)  Patient Location: PACU  Anesthesia Type:General  Level of Consciousness: awake, alert  and oriented  Airway & Oxygen Therapy: Patient Spontanous Breathing and Patient connected to nasal cannula oxygen  Post-op Assessment: Report given to PACU RN, Post -op Vital signs reviewed and stable and Patient moving all extremities  Post vital signs: Reviewed and stable  Complications: No apparent anesthesia complications

## 2012-07-20 NOTE — Anesthesia Preprocedure Evaluation (Addendum)
Anesthesia Evaluation  Patient identified by MRN, date of birth, ID band Patient awake    Reviewed: Allergy & Precautions, H&P , NPO status , Patient's Chart, lab work & pertinent test results  History of Anesthesia Complications Negative for: history of anesthetic complications  Airway Mallampati: II TM Distance: >3 FB Neck ROM: Full    Dental  (+) Teeth Intact and Dental Advisory Given   Pulmonary sleep apnea , Current Smoker,    Pulmonary exam normal       Cardiovascular     Neuro/Psych    GI/Hepatic Neg liver ROS,   Endo/Other  diabetes, Oral Hypoglycemic AgentsHyperthyroidism   Renal/GU negative Renal ROS     Musculoskeletal   Abdominal   Peds  Hematology   Anesthesia Other Findings   Reproductive/Obstetrics                          Anesthesia Physical Anesthesia Plan  ASA: II  Anesthesia Plan: General   Post-op Pain Management:    Induction: Intravenous  Airway Management Planned: Oral ETT  Additional Equipment:   Intra-op Plan:   Post-operative Plan: Extubation in OR  Informed Consent: I have reviewed the patients History and Physical, chart, labs and discussed the procedure including the risks, benefits and alternatives for the proposed anesthesia with the patient or authorized representative who has indicated his/her understanding and acceptance.   Dental advisory given  Plan Discussed with: Anesthesiologist, CRNA and Surgeon  Anesthesia Plan Comments:        Anesthesia Quick Evaluation

## 2012-07-20 NOTE — Progress Notes (Signed)
Nutrition Brief Note  Patient identified on the Malnutrition Screening Tool (MST) Report  Patient meets criteria for wnl based on current BMI.   Current diet order is Regular, patient has not yet received tray however reports is very hungry. Labs and medications reviewed.   Pt admitted with back abscess s/p surgical I&D.  Pt reports good appetite and recent wt gain.  Pt reports she lost 20 lbs in Nov 2013 r/t to acute illness.  She has recently gained ~10-15 lbs back.  She is extremely hungry at time of visit and has already ordered meal with family who will be bringing to hospital.  No nutrition interventions warranted at this time. If nutrition issues arise, please consult RD.   Loyce Dys, MS RD LDN Clinical Inpatient Dietitian Pager: [phone] Weekend/After hours pager: [phone]

## 2012-07-20 NOTE — Anesthesia Procedure Notes (Signed)
Procedure Name: Intubation Date/Time: 07/20/2012 1:56 PM Performed by: Orvilla Fus A Pre-anesthesia Checklist: Patient identified, Timeout performed, Emergency Drugs available, Suction available and Patient being monitored Patient Re-evaluated:Patient Re-evaluated prior to inductionOxygen Delivery Method: Circle system utilized Preoxygenation: Pre-oxygenation with 100% oxygen Intubation Type: IV induction Ventilation: Mask ventilation without difficulty Laryngoscope Size: Mac and 4 Grade View: Grade I Tube type: Oral Tube size: 7.0 mm Number of attempts: 1 Airway Equipment and Method: Stylet Placement Confirmation: ETT inserted through vocal cords under direct vision,  breath sounds checked- equal and bilateral and positive ETCO2 Secured at: 21 cm Tube secured with: Tape Dental Injury: Teeth and Oropharynx as per pre-operative assessment

## 2012-07-20 NOTE — Op Note (Signed)
Pre Operative Diagnosis:  Left back abscess  Post Operative Diagnosis: same  Procedure: I&D of left back abscess  Surgeon: Dr. Axel Filler  Assistant: none  Anesthesia: GETA  EBL: 10 cc  Complications: none  Counts: reported as correct x 2  Findings:  Left back abscess approximately 4 cm in size. All loculations were broken up. Cultures were sent.  Indications for procedure:  The patient is a 52 year old female who arrived to clinic w/ left back mass.  Secondary toSevere pain the patient was admitted and started on antibiotics. The patient said that this urgently to the operating room.  Details of the procedure:The patient was taken back to the operating room. The patient was placed in supine position with bilateral SCDs in place. After appropriate anitbiotics were confirmed, a time-out was confirmed and all facts were verified.  The area was infiltrated with quarter percent Marcaine. A 15 blade was used to incise the area of greatest fluctuance. Cultures were taken of the abscess cavity. All loculations were broken up. It is a moderate amount of purulence was expressed. The area was irrigated out with sterile saline. Hemostasis was achieved using Bovie cautery. The areas packed with one-inch foam gauze. The wounds dressed with 4 x 4's, ABDs pad, and tape. Patient was taken to recovery in stable condition.

## 2012-07-21 LAB — GLUCOSE, CAPILLARY
Glucose-Capillary: 110 mg/dL — ABNORMAL HIGH (ref 70–99)
Glucose-Capillary: 180 mg/dL — ABNORMAL HIGH (ref 70–99)
Glucose-Capillary: 115 mg/dL — ABNORMAL HIGH (ref 70–99)

## 2012-07-21 NOTE — Treatment Plan (Signed)
Discussed with patient smoking cessation. She stated she "would like a patch while in the hospital to help her". She stated she receives patches from the [place] while out of the hospital. I notified the primary nurse (RN Terri) about her wanting a patch.

## 2012-07-21 NOTE — Progress Notes (Signed)
1 Day Post-Op I&D back abscess Subjective: Doing well, pain better, vitals stable, blood glucose stable  Objective: Vital signs in last 24 hours: Temp:  [97.7 F (36.5 C)-98.5 F (36.9 C)] 97.7 F (36.5 C) (05/31 0649) Pulse Rate:  [56-108] 76 (05/31 0649) Resp:  [9-18] 18 (05/31 0649) BP: (97-130)/(46-104) 111/72 mmHg (05/31 0649) SpO2:  [96 %-100 %] 100 % (05/31 0649) Weight:  [138 lb 3.2 oz (62.687 kg)] 138 lb 3.2 oz (62.687 kg) (05/30 1557)   Intake/Output from previous day: 05/30 0701 - 05/31 0700 In: 1902.5 [I.V.:1802.5; IV Piggyback:100] Out: -  Intake/Output this shift:     General appearance: alert and cooperative  Incision: Dressing c/d/i  Lab Results:   Recent Labs  07/19/12 2120  WBC 9.8  HGB 13.4  HCT 37.7  PLT 390   BMET  Recent Labs  07/19/12 2120  NA 136  K 4.4  CL 98  CO2 28  GLUCOSE 107*  BUN 15  CREATININE 0.88  CALCIUM 9.9   PT/INR No results found for this basename: LABPROT, INR,  in the last 72 hours ABG No results found for this basename: PHART, PCO2, PO2, HCO3,  in the last 72 hours  MEDS, Scheduled . fluconazole  200 mg Oral Daily  . insulin aspart  0-15 Units Subcutaneous TID WC  . piperacillin-tazobactam (ZOSYN)  IV  3.375 g Intravenous Q8H    Studies/Results: No results found.  Assessment: s/p Procedure(s): INCISION AND DRAINAGE ABSCESS Patient Active Problem List   Diagnosis Date Noted  . Abscess of back 07/19/2012    Doing well  Plan: Cont abx, cultures pending Will remove dressing in AM and start BID dressing changes   LOS: 2 days     .Vanita Panda, MD [facility], [place] [phone]   07/21/2012 8:03 AM

## 2012-07-22 LAB — BASIC METABOLIC PANEL
Chloride: 102 mEq/L (ref 96–112)
Creatinine, Ser: 0.82 mg/dL (ref 0.50–1.10)
GFR calc Af Amer: >90 mL/min (ref >90)
Potassium: 3.8 mEq/L (ref 3.5–5.1)
Sodium: 138 mEq/L (ref 135–145)

## 2012-07-22 LAB — GLUCOSE, CAPILLARY
Glucose-Capillary: 119 mg/dL — ABNORMAL HIGH (ref 70–99)
Glucose-Capillary: 102 mg/dL — ABNORMAL HIGH (ref 70–99)

## 2012-07-22 MED ORDER — SULFAMETHOXAZOLE-TMP DS 800-160 MG PO TABS
2.0000 | ORAL_TABLET | Freq: Two times a day (BID) | ORAL | Status: DC
  Administered 2012-07-22 – 2012-07-23 (×3): 2 via ORAL
  Filled 2012-07-22: qty 1
  Filled 2012-07-22: qty 2
  Filled 2012-07-22: qty 1
  Filled 2012-07-22 (×2): qty 2

## 2012-07-22 NOTE — Progress Notes (Signed)
2 Days Post-Op I&D back abscess Subjective: Doing well, pain better, vitals stable  Objective: Vital signs in last 24 hours: Temp:  [98.1 F (36.7 C)-98.2 F (36.8 C)] 98.2 F (36.8 C) (06/01 1610) Pulse Rate:  [84-93] 84 (06/01 0632) Resp:  [16-18] 16 (06/01 0632) BP: (108-119)/(61-84) 112/74 mmHg (06/01 0632) SpO2:  [97 %-100 %] 100 % (06/01 9604)   Intake/Output from previous day:   Intake/Output this shift:     General appearance: alert and cooperative  Incision: Dressing removed, wound base clean  Lab Results:   Recent Labs  07/19/12 2120  WBC 9.8  HGB 13.4  HCT 37.7  PLT 390   BMET  Recent Labs  07/19/12 2120 07/22/12 0440  NA 136 138  K 4.4 3.8  CL 98 102  CO2 28 26  GLUCOSE 107* 104*  BUN 15 10  CREATININE 0.88 0.82  CALCIUM 9.9 9.4   PT/INR No results found for this basename: LABPROT, INR,  in the last 72 hours ABG No results found for this basename: PHART, PCO2, PO2, HCO3,  in the last 72 hours  MEDS, Scheduled . fluconazole  200 mg Oral Daily  . insulin aspart  0-15 Units Subcutaneous TID WC  . sulfamethoxazole-trimethoprim  2 tablet Oral Q12H    Studies/Results: No results found.  Assessment: s/p Procedure(s): INCISION AND DRAINAGE ABSCESS Patient Active Problem List   Diagnosis Date Noted  . Abscess of back 07/19/2012    Doing well  Plan: Cont abx, cultures pending.  Will switch to bactrim  start BID dressing changes, pt may need HH for packing teaching at home   LOS: 3 days     .Vanita Panda, MD [facility] Surgery, [place] [phone]   07/22/2012 7:58 AM

## 2012-07-23 ENCOUNTER — Encounter (HOSPITAL_COMMUNITY): Payer: Self-pay | Admitting: General Surgery

## 2012-07-23 DIAGNOSIS — A4902 Methicillin resistant Staphylococcus aureus infection, unspecified site: Secondary | ICD-10-CM | POA: Diagnosis not present

## 2012-07-23 LAB — GLUCOSE, CAPILLARY
Glucose-Capillary: 83 mg/dL (ref 70–99)
Glucose-Capillary: 79 mg/dL (ref 70–99)

## 2012-07-23 LAB — CULTURE, ROUTINE-ABSCESS: Gram Stain: NONE SEEN

## 2012-07-23 MED ORDER — OXYCODONE-ACETAMINOPHEN 5-325 MG PO TABS: 1.0000 | ORAL_TABLET | Freq: Four times a day (QID) | ORAL | Status: DC | PRN

## 2012-07-23 MED ORDER — SULFAMETHOXAZOLE-TMP DS 800-160 MG PO TABS: 2.0000 | ORAL_TABLET | Freq: Two times a day (BID) | ORAL | Status: DC

## 2012-07-23 NOTE — Care Management Note (Signed)
  Page 1 of 1   07/23/2012     9:57:24 AM   CARE MANAGEMENT NOTE 07/23/2012  Patient:  Karla Baker, Karla Baker   Account Number:  [id number]  Date Initiated:  07/23/2012  Documentation initiated by:  Ronny Flurry  Subjective/Objective Assessment:     Action/Plan:   Anticipated DC Date:  07/23/2012   Anticipated DC Plan:  HOME W HOME HEALTH SERVICES         Choice offered to / List presented to:  C-1 Patient        HH arranged  HH-1 RN      [facility] agency  [facility]   Status of service:   Medicare Important Message given?   (If response is "NO", the following Medicare IM given date fields will be blank) Date Medicare IM given:   Date Additional Medicare IM given:    Discharge Disposition:    Per UR Regulation:    If discussed at Long Length of Stay Meetings, dates discussed:    Comments:  07-23-12 Facesheet information correct . Patinet stated her boyfriend and friend can both help with dressing changes . She has had [facility] in past ans would like that agency again.  Referral made.  Ronny Flurry RN BSN [phone]

## 2012-07-23 NOTE — Progress Notes (Addendum)
DC home at this time, verbally understood dc instructions, no questions asked

## 2012-07-23 NOTE — Discharge Summary (Signed)
Physician Discharge Summary  Patient ID: Karla Baker MRN: [id number] DOB/AGE: 1961/01/17 52 y.o.  Admit date: 07/19/2012 Discharge date: 07/23/2012  Admitting Diagnosis: Abscess of back  Discharge Diagnosis Patient Active Problem List   Diagnosis Date Noted  . MRSA (methicillin resistant Staphylococcus aureus) 07/23/2012  . Abscess of back 07/19/2012    Consultants None  Imaging: No results found.  Procedures Dr. Derrell Lolling (07/20/12) - I&D of left back abscess   Hospital Course:  52 year old female with a history of diabetes and is a smoker who about 10 days ago developed a boil on her back that she popped at home. Subsequent to that this area has recurred and has been spreading. She says she been seen in the ER couple of times. Was done a small incision at the medial aspect of the this. But she still has continued increasing pain at this site. There is also increasing edema and swelling to the lateral aspect of this. She does not have a fever. This area is very tender. She is not really able to touch this at all.  She was referred from her primary care physician to our office ([facility]) for larger I&D of the area.    Patient was admitted and underwent procedure listed above.  Tolerated procedure well and was transferred to the floor.  Diet was advanced as tolerated.  Her abscess was found to be MRSA positive and she was started on Bactrim.  On POD #3, the patient was voiding well, tolerating diet, ambulating well, pain well controlled through dressing changes, vital signs stable, wound c/d and felt stable for discharge home.  Patient will follow up in our office in 1 weeks and knows to call with questions or concerns.  She has a friend who is willing to change her dressing BID (WD).  She is to continue her antibiotics for 10 days and longer as indicated by Dr. Derrell Lolling.  Physical exam: Gen:  Alert, emotional, cooperative, NAD Back:  Abscess cavity is relatively clean, 15% slough  noted, serosanguinous, but no purulent drainage, small 3mm round furuncle just lateral to larger abscess cavity is clean.    Medication List    TAKE these medications       acetaminophen 500 MG tablet  Commonly known as:  TYLENOL  Take 1,000 mg by mouth every 6 (six) hours as needed for pain.     alendronate 70 MG tablet  Commonly known as:  FOSAMAX  Take 70 mg by mouth every Friday.     ALPRAZolam 1 MG tablet  Commonly known as:  XANAX  Take 1 mg by mouth 3 (three) times daily as needed for anxiety.     diclofenac 75 MG EC tablet  Commonly known as:  VOLTAREN  Take 75 mg by mouth 2 (two) times daily.     HYDROcodone-acetaminophen 5-325 MG per tablet  Commonly known as:  NORCO/VICODIN  Take 1 tablet by mouth every 6 (six) hours as needed for pain.     metFORMIN 850 MG tablet  Commonly known as:  GLUCOPHAGE  Take 425 mg by mouth daily with breakfast.     oxyCODONE-acetaminophen 5-325 MG per tablet  Commonly known as:  PERCOCET/ROXICET  Take 1-2 tablets by mouth every 6 (six) hours as needed.     sulfamethoxazole-trimethoprim 800-160 MG per tablet  Commonly known as:  BACTRIM DS  Take 2 tablets by mouth 2 (two) times daily. She is taking for 10 days. She started on 07/14/12 and has not completed.  Follow-up Information   Follow up with Lajean Saver, MD In 1 week. (OFFICE SHOULD CALL YOU WITH APPT TIME)    Contact information:   [address] [place] [address] [phone]       Signed: Aris Georgia [facility] [phone]  07/23/2012, 2:04 PM

## 2012-07-23 NOTE — Discharge Summary (Signed)
Tolerating wound care.  Home today.

## 2012-07-23 NOTE — Progress Notes (Deleted)
DC home at this time, verbally understood dc instructions, no questions asked 

## 2012-07-24 ENCOUNTER — Telehealth (INDEPENDENT_AMBULATORY_CARE_PROVIDER_SITE_OTHER): Payer: Self-pay

## 2012-07-24 NOTE — Telephone Encounter (Signed)
Called and left message for patient appt date & time for 08/06/12 @ 4:10pm w/Dr. Derrell Lolling

## 2012-07-24 NOTE — Telephone Encounter (Signed)
Message copied by Maryan Puls on Tue Jul 24, 2012 10:31 AM ------      Message from: Senaida Ores      Created: Tue Jul 24, 2012  8:04 AM      Regarding: RE: 1 week recheck       Should be fine, can you call her to let her know                  ----- Message -----         From: Maryan Puls, CMA         Sent: 07/23/2012   5:10 PM           To: Aris Georgia, PA-C      Subject: 1 week recheck                                           Megan,            Dr. Derrell Lolling will not be in the office next week.  Can the patient come in on 08/06/12 or is that too far out?  I can schedule patient with another physician in one week.  Just let me know.             Patient currently scheduled for 08/06/12 @ 4:10 pm.            Neysa Bonito      ----- Message -----         From: Aris Georgia, PA-C         Sent: 07/23/2012   1:55 PM           To: June Leap            Please schedule pt for f/u with Dr. Derrell Lolling in 1 week for wound check s/p I&D back abscess            Currently being packed with WD dressings             ------

## 2012-07-25 LAB — ANAEROBIC CULTURE

## 2012-07-26 DIAGNOSIS — Z8679 Personal history of other diseases of the circulatory system: Secondary | ICD-10-CM | POA: Insufficient documentation

## 2012-07-26 DIAGNOSIS — Z8739 Personal history of other diseases of the musculoskeletal system and connective tissue: Secondary | ICD-10-CM | POA: Insufficient documentation

## 2012-07-26 DIAGNOSIS — E860 Dehydration: Secondary | ICD-10-CM | POA: Insufficient documentation

## 2012-07-26 DIAGNOSIS — R112 Nausea with vomiting, unspecified: Secondary | ICD-10-CM | POA: Insufficient documentation

## 2012-07-26 DIAGNOSIS — Z8639 Personal history of other endocrine, nutritional and metabolic disease: Secondary | ICD-10-CM | POA: Insufficient documentation

## 2012-07-26 DIAGNOSIS — Z8669 Personal history of other diseases of the nervous system and sense organs: Secondary | ICD-10-CM | POA: Insufficient documentation

## 2012-07-26 DIAGNOSIS — Z79899 Other long term (current) drug therapy: Secondary | ICD-10-CM | POA: Insufficient documentation

## 2012-07-26 DIAGNOSIS — E7800 Pure hypercholesterolemia, unspecified: Secondary | ICD-10-CM | POA: Insufficient documentation

## 2012-07-26 DIAGNOSIS — F17200 Nicotine dependence, unspecified, uncomplicated: Secondary | ICD-10-CM | POA: Insufficient documentation

## 2012-07-26 DIAGNOSIS — Z862 Personal history of diseases of the blood and blood-forming organs and certain disorders involving the immune mechanism: Secondary | ICD-10-CM | POA: Insufficient documentation

## 2012-07-26 DIAGNOSIS — E119 Type 2 diabetes mellitus without complications: Secondary | ICD-10-CM | POA: Insufficient documentation

## 2012-07-26 DIAGNOSIS — Z8701 Personal history of pneumonia (recurrent): Secondary | ICD-10-CM | POA: Insufficient documentation

## 2012-07-26 DIAGNOSIS — Z872 Personal history of diseases of the skin and subcutaneous tissue: Secondary | ICD-10-CM | POA: Insufficient documentation

## 2012-07-26 LAB — COMPREHENSIVE METABOLIC PANEL
ALT: 25 U/L (ref 0–35)
AST: 21 U/L (ref 0–37)
Albumin: 4 g/dL (ref 3.5–5.2)
CO2: 18 mEq/L — ABNORMAL LOW (ref 19–32)
Chloride: 97 mEq/L (ref 96–112)
Creatinine, Ser: 2.04 mg/dL — ABNORMAL HIGH (ref 0.50–1.10)
Potassium: 4 mEq/L (ref 3.5–5.1)
Sodium: 135 mEq/L (ref 135–145)
Total Bilirubin: 0.1 mg/dL — ABNORMAL LOW (ref 0.3–1.2)

## 2012-07-26 LAB — CBC WITH DIFFERENTIAL/PLATELET
Basophils Absolute: 0 10*3/uL (ref 0.0–0.1)
Basophils Relative: 0 % (ref 0–1)
Lymphocytes Relative: 21 % (ref 12–46)
MCHC: 35.8 g/dL (ref 30.0–36.0)
Monocytes Absolute: 0.6 10*3/uL (ref 0.1–1.0)
Neutro Abs: 9.2 10*3/uL — ABNORMAL HIGH (ref 1.7–7.7)
Neutrophils Relative %: 74 % (ref 43–77)
Platelets: 596 10*3/uL — ABNORMAL HIGH (ref 150–400)
RDW: 14.2 % (ref 11.5–15.5)
WBC: 12.5 10*3/uL — ABNORMAL HIGH (ref 4.0–10.5)

## 2012-07-26 MED ORDER — ONDANSETRON 4 MG PO TBDP
8.0000 mg | ORAL_TABLET | Freq: Once | ORAL | Status: AC
  Administered 2012-07-26: 8 mg via ORAL
  Filled 2012-07-26: qty 2

## 2012-07-26 NOTE — ED Notes (Signed)
N/v since 1400 today. Bile.   Coughing up mucous. Real anxious and cannot stop.  Vomiting when not eating and smoking.  Had cig. Today.

## 2012-07-27 ENCOUNTER — Emergency Department (HOSPITAL_COMMUNITY)
Admission: EM | Admit: 2012-07-27 | Discharge: 2012-07-27 | Disposition: A | Discharge status: Home / Self Care | Payer: 59 | Attending: Emergency Medicine | Admitting: Emergency Medicine

## 2012-07-27 DIAGNOSIS — E860 Dehydration: Secondary | ICD-10-CM

## 2012-07-27 DIAGNOSIS — R1110 Vomiting, unspecified: Secondary | ICD-10-CM

## 2012-07-27 LAB — URINALYSIS, ROUTINE W REFLEX MICROSCOPIC
Glucose, UA: NEGATIVE mg/dL
Hgb urine dipstick: NEGATIVE
Ketones, ur: NEGATIVE mg/dL
Leukocytes, UA: NEGATIVE
pH: 5.5 (ref 5.0–8.0)

## 2012-07-27 LAB — URINE MICROSCOPIC-ADD ON

## 2012-07-27 MED ORDER — SODIUM CHLORIDE 0.9 % IV BOLUS (SEPSIS)
1000.0000 mL | Freq: Once | INTRAVENOUS | Status: AC
  Administered 2012-07-27: 1000 mL via INTRAVENOUS

## 2012-07-27 MED ORDER — ONDANSETRON 8 MG PO TBDP: ORAL_TABLET | ORAL | Status: DC

## 2012-07-27 MED ORDER — ONDANSETRON HCL 4 MG/2ML IJ SOLN
4.0000 mg | Freq: Once | INTRAMUSCULAR | Status: AC
  Administered 2012-07-27: 4 mg via INTRAVENOUS
  Filled 2012-07-27: qty 2

## 2012-07-27 NOTE — ED Provider Notes (Signed)
History     CSN: [id number]  Arrival date & time 07/26/12  2122   First MD Initiated Contact with Patient 07/27/12 0119      Chief Complaint  Patient presents with  . Nausea  . Emesis    (Consider location/radiation/quality/duration/timing/severity/associated sxs/prior treatment) HPI Comments: Patient presents with complaints of nausea and vomiting since 2PM today.  She denies abd pain, diarrhea, or fever.  She recently had surgery to remove a cyst from her back by Dr. Dwain Sarna.  She is taking bactrim for this.  Patient is a 52 y.o. female presenting with vomiting. The history is provided by the patient.  Emesis Severity:  Moderate Duration:  8 hours Timing:  Constant Progression:  Worsening Chronicity:  New Relieved by:  Nothing Worsened by:  Nothing tried Ineffective treatments:  None tried Associated symptoms: no abdominal pain, no diarrhea and no fever     Past Medical History  Diagnosis Date  . Type II diabetes mellitus   . High cholesterol     "don't think I take the RX anymore" (07/19/2012)  . Boil, back     "popped it 07/08/2012 & it got infected" (07/19/2012)  . Pneumonia     "a couple times" (07/19/2012)  . Sleep apnea     "suppose to wear mask; I don't" (07/19/2012)  . Hyperthyroidism     "overactive; never put me on anything for it" (07/19/2012)  . Migraines     "only when I took BCP" (07/19/2012)  . Arthritis     "knees and hands" (07/19/2012)  . Chronic lower back pain     Past Surgical History  Procedure Laterality Date  . Replacement total knee Left 2009  . Ruptured disc    . Knee arthroscopy w/ meniscal repair Left 2001  . Laparoscopy for ectopic pregnancy  ~ 1987  . Cesarean section  09/22/1980  . Tubal ligation  ~ 1987  . Incision and drainage abscess N/A 07/20/2012    Procedure: INCISION AND DRAINAGE ABSCESS;  Surgeon: Axel Filler, MD;  Location: [facility] OR;  Service: General;  Laterality: N/A;    Family History  Problem Relation Age of Onset   . Congestive Heart Failure Mother   . Cancer Father     unknown to pt - smoking related but not lung/throat    History  Substance Use Topics  . Smoking status: Current Every Day Smoker -- 1.00 packs/day for 35 years    Types: Cigarettes  . Smokeless tobacco: Never Used  . Alcohol Use: Yes     Comment: 07/19/2012 "have a drink once in a blue moon; last time was > 6 months ago"    OB History   Grav Para Term Preterm Abortions TAB SAB Ect Mult Living                  Review of Systems  Gastrointestinal: Positive for vomiting. Negative for abdominal pain and diarrhea.  All other systems reviewed and are negative.    Allergies  Latex  Home Medications   Current Outpatient Rx  Name  Route  Sig  Dispense  Refill  . acetaminophen (TYLENOL) 500 MG tablet   Oral   Take 1,000 mg by mouth every 6 (six) hours as needed for pain.          Marland Kitchen alendronate (FOSAMAX) 70 MG tablet   Oral   Take 70 mg by mouth every Friday.          . ALPRAZolam (XANAX) 1 MG tablet  Oral   Take 1 mg by mouth 3 (three) times daily as needed for anxiety.         . diclofenac (VOLTAREN) 75 MG EC tablet   Oral   Take 75 mg by mouth 2 (two) times daily.         Marland Kitchen HYDROcodone-acetaminophen (NORCO/VICODIN) 5-325 MG per tablet   Oral   Take 1 tablet by mouth every 6 (six) hours as needed for pain.   15 tablet   0   . metFORMIN (GLUCOPHAGE) 850 MG tablet   Oral   Take 425 mg by mouth daily with breakfast.         . oxyCODONE-acetaminophen (PERCOCET/ROXICET) 5-325 MG per tablet   Oral   Take 1-2 tablets by mouth every 6 (six) hours as needed.   40 tablet   0   . sulfamethoxazole-trimethoprim (BACTRIM DS) 800-160 MG per tablet   Oral   Take 2 tablets by mouth 2 (two) times daily. She is taking for 10 days. She started on 07/14/12 and has not completed.   20 tablet   0     BP 128/68  Pulse 68  Temp(Src) 98.1 F (36.7 C) (Oral)  Resp 16  SpO2 99%  Physical Exam  Nursing note  and vitals reviewed. Constitutional: She is oriented to person, place, and time. She appears well-developed and well-nourished. No distress.  HENT:  Head: Normocephalic and atraumatic.  MM's somewhat dry.  Neck: Normal range of motion. Neck supple.  Cardiovascular: Normal rate and regular rhythm.  Exam reveals no gallop and no friction rub.   No murmur heard. Pulmonary/Chest: Effort normal and breath sounds normal. No respiratory distress. She has no wheezes.  Abdominal: Soft. Bowel sounds are normal. She exhibits no distension. There is no tenderness.  Musculoskeletal: Normal range of motion.  Neurological: She is alert and oriented to person, place, and time.  Skin: Skin is warm and dry. She is not diaphoretic.    ED Course  Procedures (including critical care time)  Labs Reviewed  CBC WITH DIFFERENTIAL - Abnormal; Notable for the following:    WBC 12.5 (*)    Platelets 596 (*)    Neutro Abs 9.2 (*)    All other components within normal limits  COMPREHENSIVE METABOLIC PANEL - Abnormal; Notable for the following:    CO2 18 (*)    Glucose, Bld 149 (*)    BUN 24 (*)    Creatinine, Ser 2.04 (*)    Calcium 10.8 (*)    Total Bilirubin 0.1 (*)    GFR calc non Af Amer 26 (*)    GFR calc Af Amer 31 (*)    All other components within normal limits  URINALYSIS, ROUTINE W REFLEX MICROSCOPIC - Abnormal; Notable for the following:    APPearance TURBID (*)    All other components within normal limits  LIPASE, BLOOD  URINE MICROSCOPIC-ADD ON   No results found.   No diagnosis found.    MDM  The labs do show a mild bump in her creatinine suggestive of dehydration.  She was given ivf and medications and is tolerating po.  She is feeling much better and wants to go home.  She appears clinically well and I feel as thought this is a reasonable course of action.  She believes she can get in to see her pcp to discuss recheck of her metabolic panel.           Geoffery Lyons,  MD 07/27/12 [phone]

## 2012-08-06 ENCOUNTER — Encounter (INDEPENDENT_AMBULATORY_CARE_PROVIDER_SITE_OTHER): Payer: Self-pay | Admitting: General Surgery

## 2012-08-06 ENCOUNTER — Ambulatory Visit (INDEPENDENT_AMBULATORY_CARE_PROVIDER_SITE_OTHER): Payer: 59 | Admitting: General Surgery

## 2012-08-06 VITALS — BP 120/70 | HR 80 | Temp 98.1°F | Resp 16 | Ht 62.0 in | Wt 133.6 lb

## 2012-08-06 DIAGNOSIS — L02212 Cutaneous abscess of back [any part, except buttock]: Secondary | ICD-10-CM

## 2012-08-06 DIAGNOSIS — L03319 Cellulitis of trunk, unspecified: Secondary | ICD-10-CM

## 2012-08-06 NOTE — Progress Notes (Signed)
Patient ID: Karla Baker, female   DOB: 06/26/1960, 52 y.o.   MRN: [id number] The patient is a 52 year old female status post I&D of right back abscess. The patient was found to be MRSA positive for trigger of antibiotics appropriately. Patient had no issues at this time exchanged and twice a day dressing changes. Patient is taking shower and wash the wound.  On exam: Wound is clean dry and intact there is no erythema surrounding the wound. The wound is shallow and beefy-red approximately 4 cm by half centimeter in depth.  Assessment and plan:  52 year old female status post right back abscess.  She can't continue dressing changes as directed. Patient follow up in 3 weeks for wound check.

## 2012-08-13 ENCOUNTER — Ambulatory Visit
Admission: RE | Admit: 2012-08-13 | Discharge: 2012-08-13 | Disposition: A | Discharge status: Home / Self Care | Payer: 59 | Source: Ambulatory Visit | Attending: Otolaryngology | Admitting: Otolaryngology

## 2012-08-13 DIAGNOSIS — R921 Mammographic calcification found on diagnostic imaging of breast: Secondary | ICD-10-CM

## 2012-08-28 ENCOUNTER — Ambulatory Visit (INDEPENDENT_AMBULATORY_CARE_PROVIDER_SITE_OTHER): Payer: 59 | Admitting: General Surgery

## 2012-08-28 ENCOUNTER — Encounter (INDEPENDENT_AMBULATORY_CARE_PROVIDER_SITE_OTHER): Payer: Self-pay | Admitting: General Surgery

## 2012-08-28 VITALS — BP 140/68 | HR 113 | Temp 97.8°F | Resp 18 | Ht 62.0 in | Wt 139.4 lb

## 2012-08-28 DIAGNOSIS — L02219 Cutaneous abscess of trunk, unspecified: Secondary | ICD-10-CM

## 2012-08-28 DIAGNOSIS — L02212 Cutaneous abscess of back [any part, except buttock]: Secondary | ICD-10-CM

## 2012-08-28 NOTE — Progress Notes (Signed)
Patient ID: Karla Baker, female   DOB: 27-Jun-1960, 52 y.o.   MRN: [id number] The patient is a 52 year old female status post incision and drainage of a back abscess. Patient has been doing well postoperatively. Her wound is essentially healed at this time. Scab formation on the wound.  Assessment and plan 52 year-old female status post incision and drainage of back abscess. Patient also with a left thyroid cyst. This is benign and has been worked up and is benign it's currently only undergoing surveillance with ultrasound. 1. Patient followup when necessary.

## 2013-09-16 ENCOUNTER — Other Ambulatory Visit: Payer: Self-pay

## 2013-09-16 DIAGNOSIS — Z1231 Encounter for screening mammogram for malignant neoplasm of breast: Secondary | ICD-10-CM

## 2013-09-24 ENCOUNTER — Encounter (INDEPENDENT_AMBULATORY_CARE_PROVIDER_SITE_OTHER): Payer: Self-pay

## 2013-09-24 ENCOUNTER — Ambulatory Visit
Admission: RE | Admit: 2013-09-24 | Discharge: 2013-09-24 | Disposition: A | Discharge status: Home / Self Care | Payer: 59 | Source: Ambulatory Visit

## 2013-09-24 DIAGNOSIS — Z1231 Encounter for screening mammogram for malignant neoplasm of breast: Secondary | ICD-10-CM

## 2015-02-22 HISTORY — PX: OTHER SURGICAL HISTORY: SHX169

## 2015-03-02 ENCOUNTER — Other Ambulatory Visit: Payer: Self-pay

## 2015-03-02 DIAGNOSIS — Z1231 Encounter for screening mammogram for malignant neoplasm of breast: Secondary | ICD-10-CM

## 2015-03-11 ENCOUNTER — Ambulatory Visit
Admission: RE | Admit: 2015-03-11 | Discharge: 2015-03-11 | Disposition: A | Discharge status: Home / Self Care | Payer: Medicare Other | Source: Ambulatory Visit

## 2015-03-11 DIAGNOSIS — Z1231 Encounter for screening mammogram for malignant neoplasm of breast: Secondary | ICD-10-CM

## 2015-04-28 DIAGNOSIS — F4312 Post-traumatic stress disorder, chronic: Secondary | ICD-10-CM | POA: Diagnosis not present

## 2015-06-24 DIAGNOSIS — F4312 Post-traumatic stress disorder, chronic: Secondary | ICD-10-CM | POA: Diagnosis not present

## 2015-07-06 DIAGNOSIS — M79602 Pain in left arm: Secondary | ICD-10-CM | POA: Diagnosis not present

## 2015-07-06 DIAGNOSIS — E119 Type 2 diabetes mellitus without complications: Secondary | ICD-10-CM | POA: Diagnosis not present

## 2015-07-27 DIAGNOSIS — M7542 Impingement syndrome of left shoulder: Secondary | ICD-10-CM | POA: Diagnosis not present

## 2015-07-29 DIAGNOSIS — F4312 Post-traumatic stress disorder, chronic: Secondary | ICD-10-CM | POA: Diagnosis not present

## 2015-08-17 DIAGNOSIS — M7542 Impingement syndrome of left shoulder: Secondary | ICD-10-CM | POA: Diagnosis not present

## 2015-08-24 DIAGNOSIS — M25512 Pain in left shoulder: Secondary | ICD-10-CM | POA: Diagnosis not present

## 2015-08-31 DIAGNOSIS — M67912 Unspecified disorder of synovium and tendon, left shoulder: Secondary | ICD-10-CM | POA: Diagnosis not present

## 2015-09-15 DIAGNOSIS — F4312 Post-traumatic stress disorder, chronic: Secondary | ICD-10-CM | POA: Diagnosis not present

## 2015-09-28 DIAGNOSIS — M67912 Unspecified disorder of synovium and tendon, left shoulder: Secondary | ICD-10-CM | POA: Diagnosis not present

## 2015-10-07 DIAGNOSIS — J449 Chronic obstructive pulmonary disease, unspecified: Secondary | ICD-10-CM | POA: Diagnosis not present

## 2015-10-12 DIAGNOSIS — M67912 Unspecified disorder of synovium and tendon, left shoulder: Secondary | ICD-10-CM | POA: Diagnosis not present

## 2015-10-13 DIAGNOSIS — F4312 Post-traumatic stress disorder, chronic: Secondary | ICD-10-CM | POA: Diagnosis not present

## 2015-10-28 DIAGNOSIS — M25562 Pain in left knee: Secondary | ICD-10-CM | POA: Diagnosis not present

## 2015-10-29 DIAGNOSIS — M7542 Impingement syndrome of left shoulder: Secondary | ICD-10-CM | POA: Diagnosis not present

## 2015-10-29 DIAGNOSIS — M19012 Primary osteoarthritis, left shoulder: Secondary | ICD-10-CM | POA: Diagnosis not present

## 2015-10-29 DIAGNOSIS — M75112 Incomplete rotator cuff tear or rupture of left shoulder, not specified as traumatic: Secondary | ICD-10-CM | POA: Diagnosis not present

## 2015-10-29 DIAGNOSIS — G8918 Other acute postprocedural pain: Secondary | ICD-10-CM | POA: Diagnosis not present

## 2015-11-09 DIAGNOSIS — F4312 Post-traumatic stress disorder, chronic: Secondary | ICD-10-CM | POA: Diagnosis not present

## 2015-11-11 DIAGNOSIS — M25612 Stiffness of left shoulder, not elsewhere classified: Secondary | ICD-10-CM | POA: Diagnosis not present

## 2015-11-11 DIAGNOSIS — M25512 Pain in left shoulder: Secondary | ICD-10-CM | POA: Diagnosis not present

## 2015-11-13 DIAGNOSIS — M25612 Stiffness of left shoulder, not elsewhere classified: Secondary | ICD-10-CM | POA: Diagnosis not present

## 2015-11-13 DIAGNOSIS — M25512 Pain in left shoulder: Secondary | ICD-10-CM | POA: Diagnosis not present

## 2015-11-17 DIAGNOSIS — M25612 Stiffness of left shoulder, not elsewhere classified: Secondary | ICD-10-CM | POA: Diagnosis not present

## 2015-11-17 DIAGNOSIS — M25512 Pain in left shoulder: Secondary | ICD-10-CM | POA: Diagnosis not present

## 2015-11-18 DIAGNOSIS — M25612 Stiffness of left shoulder, not elsewhere classified: Secondary | ICD-10-CM | POA: Diagnosis not present

## 2015-11-18 DIAGNOSIS — M25512 Pain in left shoulder: Secondary | ICD-10-CM | POA: Diagnosis not present

## 2015-11-24 DIAGNOSIS — M25512 Pain in left shoulder: Secondary | ICD-10-CM | POA: Diagnosis not present

## 2015-11-24 DIAGNOSIS — M25612 Stiffness of left shoulder, not elsewhere classified: Secondary | ICD-10-CM | POA: Diagnosis not present

## 2015-11-26 DIAGNOSIS — M25512 Pain in left shoulder: Secondary | ICD-10-CM | POA: Diagnosis not present

## 2015-11-26 DIAGNOSIS — M25612 Stiffness of left shoulder, not elsewhere classified: Secondary | ICD-10-CM | POA: Diagnosis not present

## 2015-12-01 DIAGNOSIS — M25512 Pain in left shoulder: Secondary | ICD-10-CM | POA: Diagnosis not present

## 2015-12-01 DIAGNOSIS — M25612 Stiffness of left shoulder, not elsewhere classified: Secondary | ICD-10-CM | POA: Diagnosis not present

## 2015-12-08 DIAGNOSIS — M25612 Stiffness of left shoulder, not elsewhere classified: Secondary | ICD-10-CM | POA: Diagnosis not present

## 2015-12-08 DIAGNOSIS — M25512 Pain in left shoulder: Secondary | ICD-10-CM | POA: Diagnosis not present

## 2015-12-15 DIAGNOSIS — F4312 Post-traumatic stress disorder, chronic: Secondary | ICD-10-CM | POA: Diagnosis not present

## 2015-12-18 DIAGNOSIS — M25512 Pain in left shoulder: Secondary | ICD-10-CM | POA: Diagnosis not present

## 2016-01-18 DIAGNOSIS — F4312 Post-traumatic stress disorder, chronic: Secondary | ICD-10-CM | POA: Diagnosis not present

## 2016-03-01 DIAGNOSIS — F4312 Post-traumatic stress disorder, chronic: Secondary | ICD-10-CM | POA: Diagnosis not present

## 2016-03-30 ENCOUNTER — Other Ambulatory Visit: Payer: Self-pay | Admitting: Gynecology

## 2016-03-30 DIAGNOSIS — Z1231 Encounter for screening mammogram for malignant neoplasm of breast: Secondary | ICD-10-CM

## 2016-04-05 DIAGNOSIS — F4312 Post-traumatic stress disorder, chronic: Secondary | ICD-10-CM | POA: Diagnosis not present

## 2016-05-03 DIAGNOSIS — F4312 Post-traumatic stress disorder, chronic: Secondary | ICD-10-CM | POA: Diagnosis not present

## 2016-05-04 ENCOUNTER — Ambulatory Visit
Admission: RE | Admit: 2016-05-04 | Discharge: 2016-05-04 | Disposition: A | Discharge status: Home / Self Care | Payer: Medicare Other | Source: Ambulatory Visit | Attending: Gynecology | Admitting: Gynecology

## 2016-05-04 DIAGNOSIS — Z1231 Encounter for screening mammogram for malignant neoplasm of breast: Secondary | ICD-10-CM | POA: Diagnosis not present

## 2016-05-05 ENCOUNTER — Other Ambulatory Visit: Payer: Self-pay | Admitting: Gynecology

## 2016-05-05 DIAGNOSIS — R928 Other abnormal and inconclusive findings on diagnostic imaging of breast: Secondary | ICD-10-CM

## 2016-05-06 DIAGNOSIS — M25562 Pain in left knee: Secondary | ICD-10-CM | POA: Diagnosis not present

## 2016-05-09 ENCOUNTER — Other Ambulatory Visit: Payer: Medicare Other

## 2016-05-16 DIAGNOSIS — M7062 Trochanteric bursitis, left hip: Secondary | ICD-10-CM | POA: Diagnosis not present

## 2016-05-16 DIAGNOSIS — M25512 Pain in left shoulder: Secondary | ICD-10-CM | POA: Diagnosis not present

## 2016-05-24 DIAGNOSIS — M25512 Pain in left shoulder: Secondary | ICD-10-CM | POA: Diagnosis not present

## 2016-05-31 DIAGNOSIS — F4312 Post-traumatic stress disorder, chronic: Secondary | ICD-10-CM | POA: Diagnosis not present

## 2016-06-03 DIAGNOSIS — M7062 Trochanteric bursitis, left hip: Secondary | ICD-10-CM | POA: Diagnosis not present

## 2016-06-17 DIAGNOSIS — M545 Low back pain: Secondary | ICD-10-CM | POA: Diagnosis not present

## 2016-06-27 DIAGNOSIS — M545 Low back pain: Secondary | ICD-10-CM | POA: Diagnosis not present

## 2016-07-12 DIAGNOSIS — M545 Low back pain: Secondary | ICD-10-CM | POA: Diagnosis not present

## 2016-07-12 DIAGNOSIS — E118 Type 2 diabetes mellitus with unspecified complications: Secondary | ICD-10-CM | POA: Diagnosis not present

## 2016-07-12 DIAGNOSIS — E785 Hyperlipidemia, unspecified: Secondary | ICD-10-CM | POA: Diagnosis not present

## 2016-07-12 DIAGNOSIS — N3281 Overactive bladder: Secondary | ICD-10-CM | POA: Diagnosis not present

## 2016-07-12 DIAGNOSIS — G4730 Sleep apnea, unspecified: Secondary | ICD-10-CM | POA: Diagnosis not present

## 2016-08-10 DIAGNOSIS — F4312 Post-traumatic stress disorder, chronic: Secondary | ICD-10-CM | POA: Diagnosis not present

## 2016-09-29 DIAGNOSIS — F4312 Post-traumatic stress disorder, chronic: Secondary | ICD-10-CM | POA: Diagnosis not present

## 2016-10-10 DIAGNOSIS — I10 Essential (primary) hypertension: Secondary | ICD-10-CM | POA: Diagnosis not present

## 2016-10-10 DIAGNOSIS — M25512 Pain in left shoulder: Secondary | ICD-10-CM | POA: Diagnosis not present

## 2016-10-10 DIAGNOSIS — E118 Type 2 diabetes mellitus with unspecified complications: Secondary | ICD-10-CM | POA: Diagnosis not present

## 2016-10-19 DIAGNOSIS — M5136 Other intervertebral disc degeneration, lumbar region: Secondary | ICD-10-CM | POA: Diagnosis not present

## 2016-10-20 DIAGNOSIS — Z0000 Encounter for general adult medical examination without abnormal findings: Secondary | ICD-10-CM | POA: Diagnosis not present

## 2016-10-29 ENCOUNTER — Emergency Department (HOSPITAL_COMMUNITY): Payer: Medicare Other

## 2016-10-29 ENCOUNTER — Encounter (HOSPITAL_COMMUNITY): Payer: Self-pay

## 2016-10-29 ENCOUNTER — Emergency Department (HOSPITAL_COMMUNITY)
Admission: EM | Admit: 2016-10-29 | Discharge: 2016-10-29 | Disposition: A | Discharge status: Home / Self Care | Payer: Medicare Other | Attending: Emergency Medicine | Admitting: Emergency Medicine

## 2016-10-29 DIAGNOSIS — E119 Type 2 diabetes mellitus without complications: Secondary | ICD-10-CM | POA: Diagnosis not present

## 2016-10-29 DIAGNOSIS — Z7984 Long term (current) use of oral hypoglycemic drugs: Secondary | ICD-10-CM | POA: Insufficient documentation

## 2016-10-29 DIAGNOSIS — M50323 Other cervical disc degeneration at C6-C7 level: Secondary | ICD-10-CM | POA: Diagnosis not present

## 2016-10-29 DIAGNOSIS — F17210 Nicotine dependence, cigarettes, uncomplicated: Secondary | ICD-10-CM | POA: Diagnosis not present

## 2016-10-29 DIAGNOSIS — M542 Cervicalgia: Secondary | ICD-10-CM | POA: Diagnosis not present

## 2016-10-29 MED ORDER — KETOROLAC TROMETHAMINE 60 MG/2ML IM SOLN
60.0000 mg | Freq: Once | INTRAMUSCULAR | Status: AC
  Administered 2016-10-29: 60 mg via INTRAMUSCULAR
  Filled 2016-10-29: qty 2

## 2016-10-29 MED ORDER — METHOCARBAMOL 500 MG PO TABS
500.0000 mg | ORAL_TABLET | Freq: Once | ORAL | Status: AC
  Administered 2016-10-29: 500 mg via ORAL
  Filled 2016-10-29: qty 1

## 2016-10-29 MED ORDER — METHOCARBAMOL 500 MG PO TABS: 500.0000 mg | ORAL_TABLET | Freq: Three times a day (TID) | ORAL | 0 refills | Status: DC | PRN

## 2016-10-29 MED ORDER — OXYCODONE-ACETAMINOPHEN 5-325 MG PO TABS: 1.0000 | ORAL_TABLET | ORAL | 0 refills | Status: DC | PRN

## 2016-10-29 MED ORDER — OXYCODONE-ACETAMINOPHEN 5-325 MG PO TABS
2.0000 | ORAL_TABLET | Freq: Once | ORAL | Status: AC
  Administered 2016-10-29: 2 via ORAL
  Filled 2016-10-29: qty 2

## 2016-10-29 NOTE — ED Notes (Signed)
Patient transported to CT 

## 2016-10-29 NOTE — ED Triage Notes (Signed)
Pt complains of neck pain for one month, she has been seen by her primary Dr and an orthopedic and has had two rounds of prednisone Pt states the pain is worse this week

## 2016-10-30 NOTE — ED Provider Notes (Signed)
[facility] DEPT Provider Note   CSN: [id number] Arrival date & time: 10/29/16  0506     History   Chief Complaint Chief Complaint  Patient presents with  . Neck Pain    HPI Karla RANDOL is a 56 y.o. female.  HPI Patient's a 56 year old female who presents emergency department complaining of worsening pain in her neck.  She denies paresthesias in her upper extremities.  She did have a questionable breast mass in March 2018 but she states that this was reevaluated the [facility] and determined that she did not have breast cancer or a breast mass.  No fevers or chills.  No history of IV drug abuse.  She has been seen by her primary care physician as well as her orthopedic surgeon and is on a prednisone taper and reports this does not seem to be helping her pain.  She is requesting something stronger for pain.  She has tried anti-inflammatories without improvements.  Symptoms are moderate in severity   Past Medical History:  Diagnosis Date  . Arthritis    "knees and hands" (07/19/2012)  . Boil, back    "popped it 07/08/2012 & it got infected" (07/19/2012)  . Chronic lower back pain   . High cholesterol    "don't think I take the RX anymore" (07/19/2012)  . Hyperthyroidism    "overactive; never put me on anything for it" (07/19/2012)  . Migraines    "only when I took BCP" (07/19/2012)  . Pneumonia    "a couple times" (07/19/2012)  . Sleep apnea    "suppose to wear mask; I don't" (07/19/2012)  . Type II diabetes mellitus [facility])     Patient Active Problem List   Diagnosis Date Noted  . MRSA (methicillin resistant Staphylococcus aureus) 07/23/2012  . Abscess of back 07/19/2012    Past Surgical History:  Procedure Laterality Date  . BREAST BIOPSY Left    u/s guided core biopsy  . CESAREAN SECTION  09/22/1980  . INCISION AND DRAINAGE ABSCESS N/A 07/20/2012   Procedure: INCISION AND DRAINAGE ABSCESS;  Surgeon: Ralene Ok, MD;  Location: [facility];  Service: General;   Laterality: N/A;  . KNEE ARTHROSCOPY W/ MENISCAL REPAIR Left 2001  . LAPAROSCOPY FOR ECTOPIC PREGNANCY  ~ 1987  . REPLACEMENT TOTAL KNEE Left 2009  . ruptured disc    . TUBAL LIGATION  ~ 1987    OB History    No data available       Home Medications    Prior to Admission medications   Medication Sig Start Date End Date Taking? Authorizing Provider  alendronate (FOSAMAX) 70 MG tablet Take 70 mg by mouth every Friday.     [provider]  ALPRAZolam Duanne Moron) 1 MG tablet Take 1 mg by mouth 3 (three) times daily as needed for anxiety.    [provider]  diclofenac (VOLTAREN) 75 MG EC tablet Take 75 mg by mouth 2 (two) times daily.    [provider]  metFORMIN (GLUCOPHAGE) 850 MG tablet Take 425 mg by mouth daily with breakfast.    [provider]  methocarbamol (ROBAXIN) 500 MG tablet Take 1 tablet (500 mg total) by mouth every 8 (eight) hours as needed for muscle spasms. 10/29/16   Jola Schmidt, MD  oxyCODONE-acetaminophen (PERCOCET/ROXICET) 5-325 MG tablet Take 1 tablet by mouth every 4 (four) hours as needed for severe pain. 10/29/16   Jola Schmidt, MD    Family History Family History  Problem Relation Age of Onset  .  Congestive Heart Failure Mother   . Cancer Father        unknown to pt - smoking related but not lung/throat    Social History Social History  Substance Use Topics  . Smoking status: Current Every Day Smoker    Packs/day: 1.00    Years: 35.00    Types: Cigarettes  . Smokeless tobacco: Never Used  . Alcohol use Yes     Comment: 07/19/2012 "have a drink once in a blue moon; last time was > 6 months ago"     Allergies   Latex   Review of Systems Review of Systems  All other systems reviewed and are negative.    Physical Exam Updated Vital Signs BP 109/80 (BP Location: Left Arm)   Pulse 82   Temp 97.9 F (36.6 C) (Oral)   Resp 18   SpO2 96%   Physical Exam  Constitutional: She is oriented to person, place,  and time. She appears well-developed and well-nourished.  HENT:  Head: Normocephalic.  Eyes: EOM are normal.  Neck: Normal range of motion.  Cardiovascular: Normal rate.   Pulmonary/Chest: Effort normal.  Abdominal: She exhibits no distension.  Musculoskeletal:  Full range of motion bilateral upper extremity major joints.  Normal grip strength bilaterally.  Mild cervical and paracervical tenderness  Neurological: She is alert and oriented to person, place, and time.  Psychiatric: She has a normal mood and affect.  Nursing note and vitals reviewed.    ED Treatments / Results  Labs (all labs ordered are listed, but only abnormal results are displayed) Labs Reviewed - No data to display  EKG  EKG Interpretation None       Radiology Ct Cervical Spine Wo Contrast  Result Date: 10/29/2016 CLINICAL DATA:  Neck pain for 1 month. EXAM: CT CERVICAL SPINE WITHOUT CONTRAST TECHNIQUE: Multidetector CT imaging of the cervical spine was performed without intravenous contrast. Multiplanar CT image reconstructions were also generated. COMPARISON:  None. FINDINGS: Alignment: Mild reversal of normal lordosis is noted which may be positional in origin. Skull base and vertebrae: No acute fracture. No primary bone lesion or focal pathologic process. Soft tissues and spinal canal: No prevertebral fluid or swelling. No visible canal hematoma. Disc levels: Mild degenerative disc disease is noted at C5-6 and C6-7 with anterior osteophyte formation. Upper chest: Negative. Other: Degenerative changes seen involving posterior facet joints bilaterally. IMPRESSION: Multilevel degenerative disc disease. No acute abnormality seen in the cervical spine. Electronically Signed   By: Marijo Conception, M.D.   On: 10/29/2016 07:11    Procedures Procedures (including critical care time)  Medications Ordered in ED Medications  oxyCODONE-acetaminophen (PERCOCET/ROXICET) 5-325 MG per tablet 2 tablet (2 tablets Oral Given  10/29/16 0644)  methocarbamol (ROBAXIN) tablet 500 mg (500 mg Oral Given 10/29/16 0644)  ketorolac (TORADOL) injection 60 mg (60 mg Intramuscular Given 10/29/16 0646)     Initial Impression / Assessment and Plan / ED Course  I have reviewed the triage vital signs and the nursing notes.  Pertinent labs & imaging results that were available during my care of the patient were reviewed by me and considered in my medical decision making (see chart for details).     Pain improved in the emergency department.  Home with short course of pain medication.  She'll continue the steroids and follow-up with orthopedic surgeon  Doubt spinal cord pathology.  No indication for MRI tonight  Final Clinical Impressions(s) / ED Diagnoses   Final diagnoses:  Acute neck pain  New Prescriptions Discharge Medication List as of 10/29/2016  7:27 AM    START taking these medications   Details  methocarbamol (ROBAXIN) 500 MG tablet Take 1 tablet (500 mg total) by mouth every 8 (eight) hours as needed for muscle spasms., Starting Sat 10/29/2016, Print         Jola Schmidt, MD 10/30/16 [phone]

## 2016-11-08 DIAGNOSIS — F4312 Post-traumatic stress disorder, chronic: Secondary | ICD-10-CM | POA: Diagnosis not present

## 2016-11-15 DIAGNOSIS — I10 Essential (primary) hypertension: Secondary | ICD-10-CM | POA: Diagnosis not present

## 2016-12-13 DIAGNOSIS — I10 Essential (primary) hypertension: Secondary | ICD-10-CM | POA: Diagnosis not present

## 2016-12-21 DIAGNOSIS — F4312 Post-traumatic stress disorder, chronic: Secondary | ICD-10-CM | POA: Diagnosis not present

## 2017-02-01 DIAGNOSIS — F4312 Post-traumatic stress disorder, chronic: Secondary | ICD-10-CM | POA: Diagnosis not present

## 2017-03-14 DIAGNOSIS — R000 Tachycardia, unspecified: Secondary | ICD-10-CM | POA: Diagnosis not present

## 2017-03-14 DIAGNOSIS — F4312 Post-traumatic stress disorder, chronic: Secondary | ICD-10-CM | POA: Diagnosis not present

## 2017-03-14 DIAGNOSIS — I10 Essential (primary) hypertension: Secondary | ICD-10-CM | POA: Diagnosis not present

## 2017-03-14 DIAGNOSIS — E118 Type 2 diabetes mellitus with unspecified complications: Secondary | ICD-10-CM | POA: Diagnosis not present

## 2017-04-17 DIAGNOSIS — F4312 Post-traumatic stress disorder, chronic: Secondary | ICD-10-CM | POA: Diagnosis not present

## 2017-05-17 DIAGNOSIS — J069 Acute upper respiratory infection, unspecified: Secondary | ICD-10-CM | POA: Diagnosis not present

## 2017-05-22 DIAGNOSIS — R05 Cough: Secondary | ICD-10-CM | POA: Diagnosis not present

## 2017-05-22 DIAGNOSIS — H9192 Unspecified hearing loss, left ear: Secondary | ICD-10-CM | POA: Diagnosis not present

## 2017-05-23 DIAGNOSIS — F4312 Post-traumatic stress disorder, chronic: Secondary | ICD-10-CM | POA: Diagnosis not present

## 2017-05-31 ENCOUNTER — Other Ambulatory Visit: Payer: Self-pay | Admitting: Orthopedic Surgery

## 2017-06-02 NOTE — Pre-Procedure Instructions (Addendum)
Karla Baker  06/02/2017      [id number] Karla Baker Karla Baker [id number] Phone: [phone] Fax: [phone]    Your procedure is scheduled on April 17  Report to Karla Baker at 1000 A.M.  Call this number if you have problems the morning of surgery:  [phone]   Remember:  Do not eat food or drink liquids after midnight.  Take these medicines the morning of surgery with A SIP OF WATER Tylenol if needed, Amlodipine (norvasc), Mucinex, Methocarbamol (Robaxin) if needed, omeprazole (Prilosec), Oxycodone (Percocet) if needed  Stop taking aspirin, BC's, Goody's, Herbal medications, Fish oil, Vitamins, Aleve, Ibuprofen, Advil, Motrin, diclofenac (voltaren)    How to Manage Your Diabetes Before and After Surgery  Why is it important to control my blood sugar before and after surgery? . Improving blood sugar levels before and after surgery helps healing and can limit problems. . A way of improving blood sugar control is eating a healthy diet by: o  Eating less sugar and carbohydrates o  Increasing activity/exercise o  Talking with your doctor about reaching your blood sugar goals . High blood sugars (greater than 180 mg/dL) can raise your risk of infections and slow your recovery, so you will need to focus on controlling your diabetes during the weeks before surgery. . Make sure that the doctor who takes care of your diabetes knows about your planned surgery including the date and location.  How do I manage my blood sugar before surgery? . Check your blood sugar at least 4 times a day, starting 2 days before surgery, to make sure that the level is not too high or low. o Check your blood sugar the morning of your surgery when you wake up and every 2 hours until you get to the Short Stay unit. . If your blood sugar is less than 70 mg/dL, you will need to treat for low blood sugar: o Do not take  insulin. o Treat a low blood sugar (less than 70 mg/dL) with  cup of clear juice (cranberry or apple), 4 glucose tablets, OR glucose gel. Recheck blood sugar in 15 minutes after treatment (to make sure it is greater than 70 mg/dL). If your blood sugar is not greater than 70 mg/dL on recheck, call [phone] o  for further instructions. . Report your blood sugar to the short stay nurse when you get to Short Stay.  . If you are admitted to the hospital after surgery: o Your blood sugar will be checked by the staff and you will probably be given insulin after surgery (instead of oral diabetes medicines) to make sure you have good blood sugar levels. o The goal for blood sugar control after surgery is 80-180 mg/dL.              WHAT DO I DO ABOUT MY DIABETES MEDICATION?   Do not take oral diabetes medicines (pills) the morning of surgery. Metformin (glucophage)    . The day of surgery, do not take other diabetes injectables, including Byetta (exenatide), Bydureon (exenatide ER), Victoza (liraglutide), or Trulicity (dulaglutide).  . If your CBG is greater than 220 mg/dL, you may take  of your sliding scale (correction) dose of insulin.  Other Instructions:          Patient Signature:  Date:   Nurse Signature:  Date:   Reviewed and Endorsed by [name] Patient Education Committee, August 2015  Do  not wear jewelry, make-up or nail polish.  Do not wear lotions, powders, or perfumes, or deodorant.  Do not shave 48 hours prior to surgery.  Men may shave face and neck.  Do not bring valuables to the hospital.  [name] is not responsible for any belongings or valuables.  Contacts, dentures or bridgework may not be worn into surgery.  Leave your suitcase in the car.  After surgery it may be brought to your room.  For patients admitted to the hospital, discharge time will be determined by your treatment team.  Patients discharged the day of surgery will not be allowed  to drive home.   Special instructions:   [name]  Before surgery, you can play an important role. Because skin is not sterile, your skin needs to be as free of germs as possible. You can reduce the number of germs on your skin by washing with CHG (chlorahexidine gluconate) Soap before surgery.  CHG is an antiseptic cleaner which kills germs and bonds with the skin to continue killing germs even after washing.  Please do not use if you have an allergy to CHG or antibacterial soaps. If your skin becomes reddened/irritated stop using the CHG.  Do not shave (including legs and underarms) for at least 48 hours prior to first CHG shower. It is OK to shave your face.  Please follow these instructions carefully.   1. Shower the NIGHT BEFORE SURGERY and the MORNING OF SURGERY with CHG.   2. If you chose to wash your hair, wash your hair first as usual with your normal shampoo.  3. After you shampoo, rinse your hair and body thoroughly to remove the shampoo.  4. Use CHG as you would any other liquid soap. You can apply CHG directly to the skin and wash gently with a scrungie or a clean washcloth.   5. Apply the CHG Soap to your body ONLY FROM THE NECK DOWN.  Do not use on open wounds or open sores. Avoid contact with your eyes, ears, mouth and genitals (private parts). Wash Face and genitals (private parts)  with your normal soap.  6. Wash thoroughly, paying special attention to the area where your surgery will be performed.  7. Thoroughly rinse your body with warm water from the neck down.  8. DO NOT shower/wash with your normal soap after using and rinsing off the CHG Soap.  9. Pat yourself dry with a CLEAN TOWEL.  10. Wear CLEAN PAJAMAS to bed the night before surgery, wear comfortable clothes the morning of surgery  11. Place CLEAN SHEETS on your bed the night of your first shower and DO NOT SLEEP WITH PETS.    Day of Surgery: Do not apply any  deodorants/lotions. Please wear clean clothes to the hospital/surgery center.      Please read over the following fact sheets that you were given. Pain Booklet, Coughing and Deep Breathing and Surgical Site Infection Prevention

## 2017-06-05 ENCOUNTER — Encounter (HOSPITAL_COMMUNITY): Payer: Self-pay | Admitting: Emergency Medicine

## 2017-06-05 ENCOUNTER — Ambulatory Visit (HOSPITAL_COMMUNITY)
Admission: RE | Admit: 2017-06-05 | Discharge: 2017-06-05 | Disposition: A | Discharge status: Home / Self Care | Payer: Medicare Other | Source: Ambulatory Visit | Attending: Orthopedic Surgery | Admitting: Orthopedic Surgery

## 2017-06-05 ENCOUNTER — Encounter (HOSPITAL_COMMUNITY)
Admission: RE | Admit: 2017-06-05 | Discharge: 2017-06-05 | Disposition: A | Discharge status: Home / Self Care | Source: Ambulatory Visit | Attending: Orthopedic Surgery | Admitting: Orthopedic Surgery

## 2017-06-05 ENCOUNTER — Encounter (HOSPITAL_COMMUNITY): Payer: Self-pay

## 2017-06-05 ENCOUNTER — Other Ambulatory Visit: Payer: Self-pay

## 2017-06-05 DIAGNOSIS — Z01818 Encounter for other preprocedural examination: Secondary | ICD-10-CM

## 2017-06-05 DIAGNOSIS — E039 Hypothyroidism, unspecified: Secondary | ICD-10-CM | POA: Diagnosis not present

## 2017-06-05 DIAGNOSIS — K219 Gastro-esophageal reflux disease without esophagitis: Secondary | ICD-10-CM | POA: Insufficient documentation

## 2017-06-05 DIAGNOSIS — E785 Hyperlipidemia, unspecified: Secondary | ICD-10-CM | POA: Diagnosis not present

## 2017-06-05 DIAGNOSIS — R05 Cough: Secondary | ICD-10-CM | POA: Diagnosis not present

## 2017-06-05 DIAGNOSIS — G8929 Other chronic pain: Secondary | ICD-10-CM | POA: Insufficient documentation

## 2017-06-05 DIAGNOSIS — G4733 Obstructive sleep apnea (adult) (pediatric): Secondary | ICD-10-CM | POA: Insufficient documentation

## 2017-06-05 DIAGNOSIS — E119 Type 2 diabetes mellitus without complications: Secondary | ICD-10-CM | POA: Diagnosis not present

## 2017-06-05 DIAGNOSIS — Z01810 Encounter for preprocedural cardiovascular examination: Secondary | ICD-10-CM | POA: Diagnosis present

## 2017-06-05 DIAGNOSIS — R000 Tachycardia, unspecified: Secondary | ICD-10-CM | POA: Diagnosis not present

## 2017-06-05 DIAGNOSIS — Z87891 Personal history of nicotine dependence: Secondary | ICD-10-CM | POA: Insufficient documentation

## 2017-06-05 DIAGNOSIS — I10 Essential (primary) hypertension: Secondary | ICD-10-CM | POA: Insufficient documentation

## 2017-06-05 DIAGNOSIS — Z01812 Encounter for preprocedural laboratory examination: Secondary | ICD-10-CM | POA: Insufficient documentation

## 2017-06-05 DIAGNOSIS — M545 Low back pain: Secondary | ICD-10-CM | POA: Diagnosis not present

## 2017-06-05 DIAGNOSIS — Z8701 Personal history of pneumonia (recurrent): Secondary | ICD-10-CM | POA: Insufficient documentation

## 2017-06-05 DIAGNOSIS — E118 Type 2 diabetes mellitus with unspecified complications: Secondary | ICD-10-CM | POA: Diagnosis not present

## 2017-06-05 HISTORY — DX: Depression, unspecified: F32.A

## 2017-06-05 HISTORY — DX: Essential (primary) hypertension: I10

## 2017-06-05 HISTORY — DX: Gastro-esophageal reflux disease without esophagitis: K21.9

## 2017-06-05 HISTORY — DX: Major depressive disorder, single episode, unspecified: F32.9

## 2017-06-05 LAB — CBC WITH DIFFERENTIAL/PLATELET
BASOS ABS: 0 10*3/uL (ref 0.0–0.1)
Basophils Relative: 0 %
Eosinophils Absolute: 0.3 10*3/uL (ref 0.0–0.7)
Eosinophils Relative: 3 %
HEMATOCRIT: 43 % (ref 36.0–46.0)
Hemoglobin: 14.4 g/dL (ref 12.0–15.0)
LYMPHS PCT: 31 %
Lymphs Abs: 3.2 10*3/uL (ref 0.7–4.0)
MCH: 31.2 pg (ref 26.0–34.0)
MCHC: 33.5 g/dL (ref 30.0–36.0)
MCV: 93.1 fL (ref 78.0–100.0)
MONO ABS: 0.5 10*3/uL (ref 0.1–1.0)
Monocytes Relative: 5 %
NEUTROS ABS: 6.2 10*3/uL (ref 1.7–7.7)
NEUTROS PCT: 61 %
Platelets: 492 10*3/uL — ABNORMAL HIGH (ref 150–400)
RBC: 4.62 MIL/uL (ref 3.87–5.11)
RDW: 15.1 % (ref 11.5–15.5)
WBC: 10.2 10*3/uL (ref 4.0–10.5)

## 2017-06-05 LAB — URINALYSIS, ROUTINE W REFLEX MICROSCOPIC
Bilirubin Urine: NEGATIVE
GLUCOSE, UA: NEGATIVE mg/dL
HGB URINE DIPSTICK: NEGATIVE
KETONES UR: NEGATIVE mg/dL
Leukocytes, UA: NEGATIVE
Nitrite: POSITIVE — AB
Protein, ur: NEGATIVE mg/dL
SPECIFIC GRAVITY, URINE: 1.018 (ref 1.005–1.030)
pH: 5 (ref 5.0–8.0)

## 2017-06-05 LAB — APTT: APTT: 31 s (ref 24–36)

## 2017-06-05 LAB — COMPREHENSIVE METABOLIC PANEL
ALT: 30 U/L (ref 14–54)
AST: 24 U/L (ref 15–41)
Albumin: 4.3 g/dL (ref 3.5–5.0)
Alkaline Phosphatase: 90 U/L (ref 38–126)
Anion gap: 15 (ref 5–15)
BILIRUBIN TOTAL: 0.6 mg/dL (ref 0.3–1.2)
BUN: 14 mg/dL (ref 6–20)
CO2: 20 mmol/L — ABNORMAL LOW (ref 22–32)
CREATININE: 0.93 mg/dL (ref 0.44–1.00)
Calcium: 9.9 mg/dL (ref 8.9–10.3)
Chloride: 104 mmol/L (ref 101–111)
GFR calc Af Amer: >60 mL/min (ref >60)
GLUCOSE: 141 mg/dL — AB (ref 65–99)
Potassium: 3.8 mmol/L (ref 3.5–5.1)
Sodium: 139 mmol/L (ref 135–145)
TOTAL PROTEIN: 8.2 g/dL — AB (ref 6.5–8.1)

## 2017-06-05 LAB — TYPE AND SCREEN
ABO/RH(D): O POS
ANTIBODY SCREEN: NEGATIVE

## 2017-06-05 LAB — SURGICAL PCR SCREEN
MRSA, PCR: NEGATIVE
Staphylococcus aureus: NEGATIVE

## 2017-06-05 LAB — HEMOGLOBIN A1C
Hgb A1c MFr Bld: 7.1 % — ABNORMAL HIGH (ref 4.8–5.6)
Mean Plasma Glucose: 157.07 mg/dL

## 2017-06-05 LAB — GLUCOSE, CAPILLARY: GLUCOSE-CAPILLARY: 147 mg/dL — AB (ref 65–99)

## 2017-06-05 LAB — ABO/RH: ABO/RH(D): O POS

## 2017-06-05 LAB — PROTIME-INR
INR: 1.01
PROTHROMBIN TIME: 13.2 s (ref 11.4–15.2)

## 2017-06-05 IMAGING — CR DG CHEST 2V
2 series · 2 of 2 positions shown · non-contrast
Comparison: [DATE]

CLINICAL DATA: Pre-op respiratory exam for lumbar spine surgery.
Productive cough.

EXAM:
CHEST - 2 VIEW

[w chest pa]
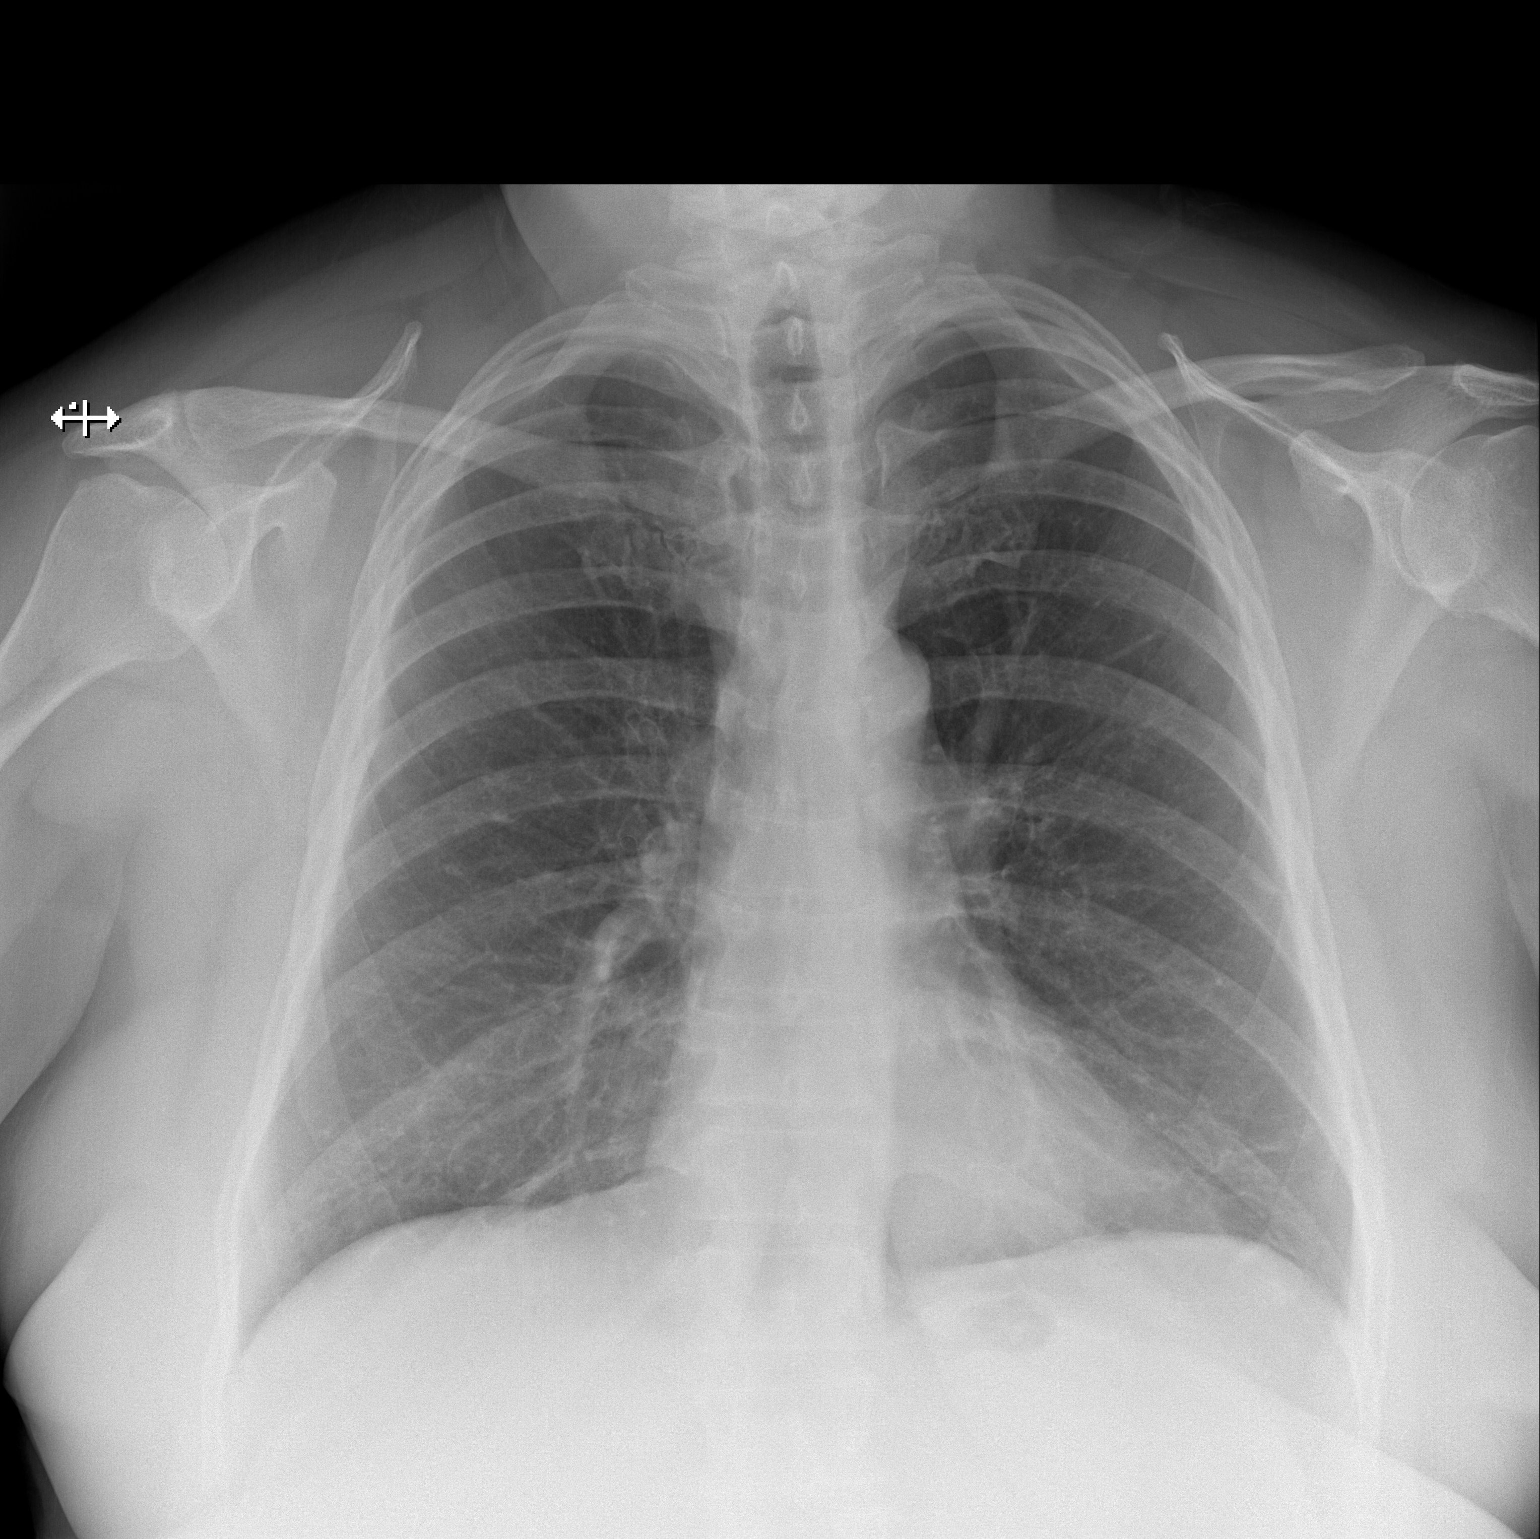

[w chest lat]
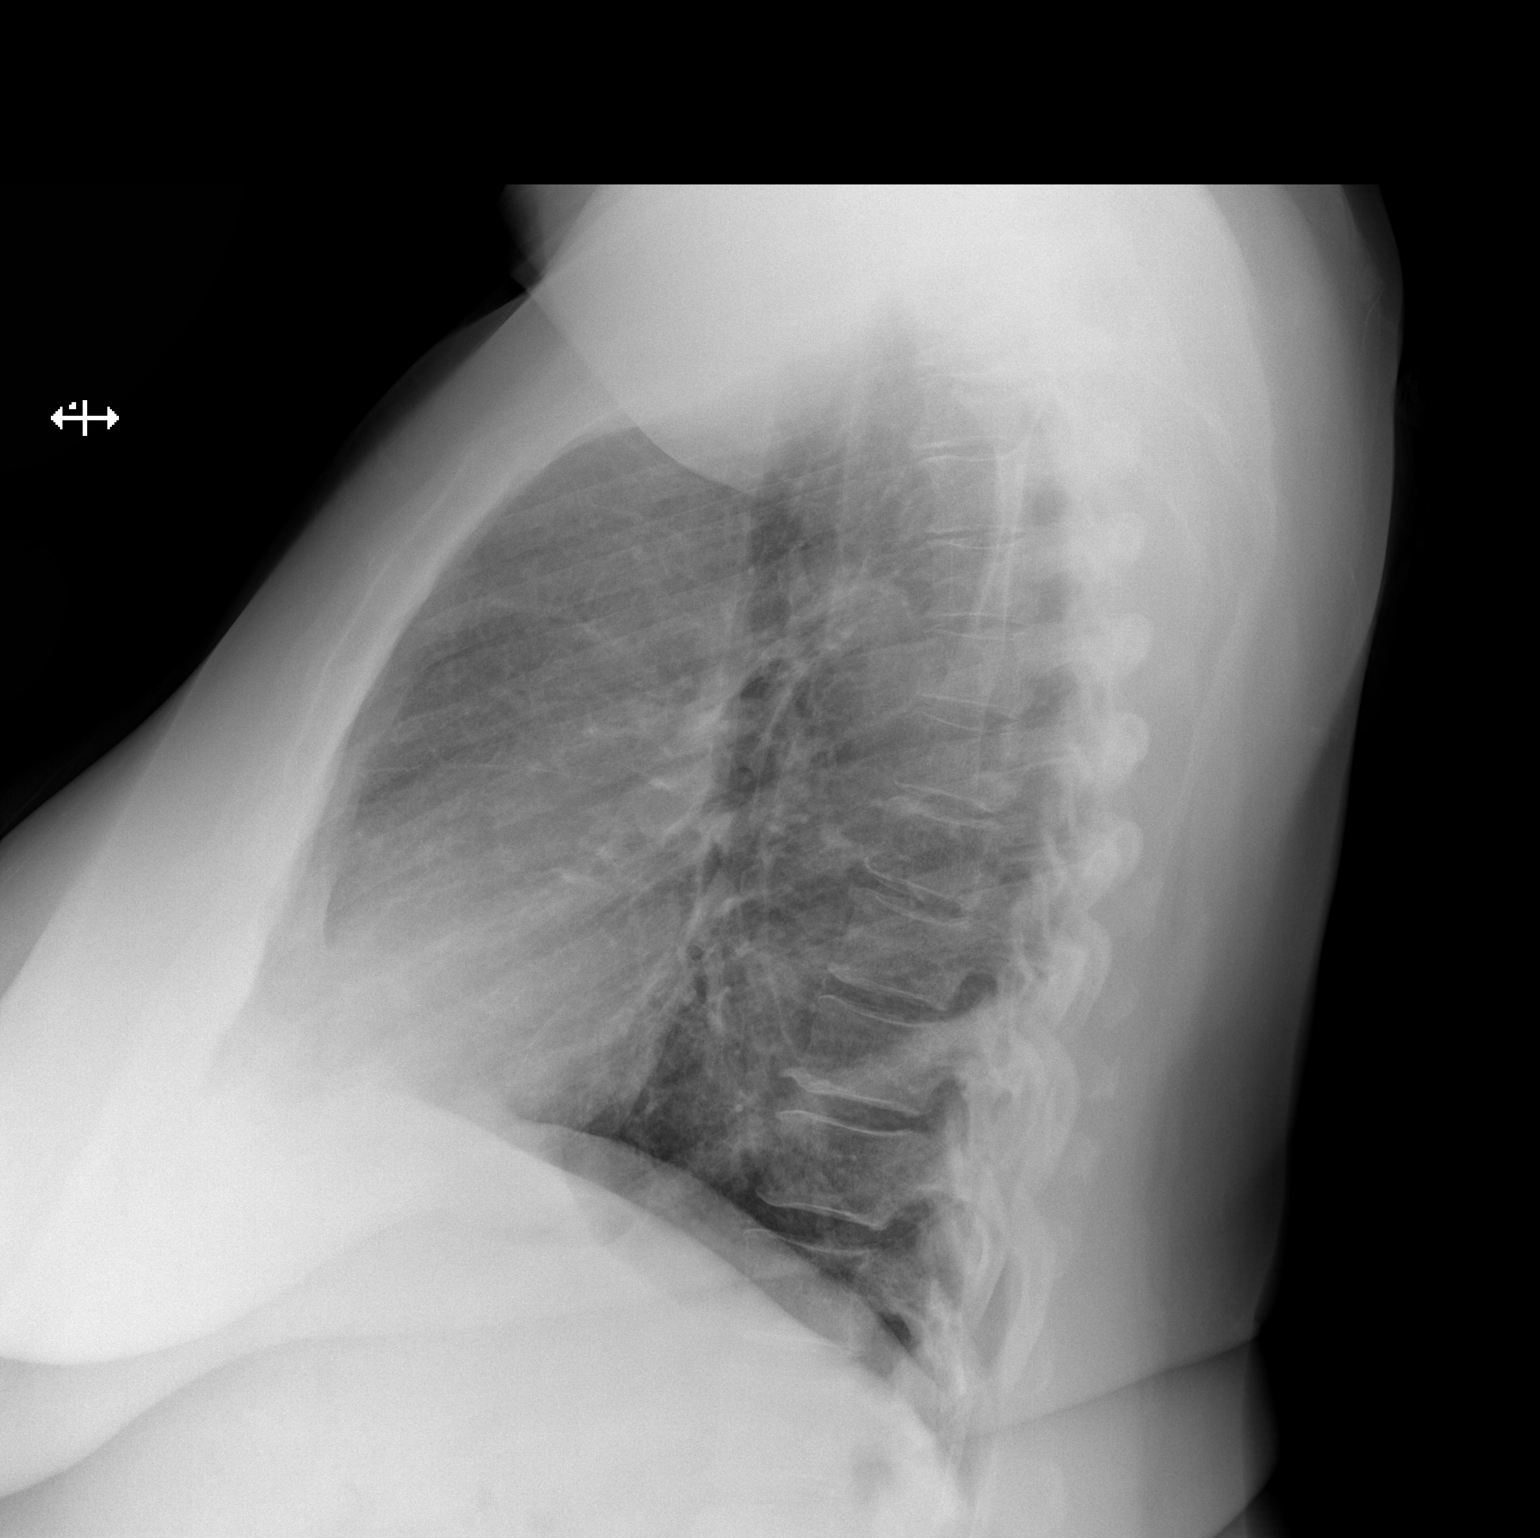

[2 of 2 positions shown; findings below may reference images not displayed]

FINDINGS: The heart size and mediastinal contours are within normal limits.
Both lungs are clear. The visualized skeletal structures are
unremarkable.
IMPRESSION: Stable exam.  No active cardiopulmonary disease.

## 2017-06-05 NOTE — Progress Notes (Addendum)
PCP is Dr. Iona Beard Denies seeing a cardiologist. Denies chest pain or fever. Reports a slight cough at times. States she had the flu 3-4 weeks ago.  Reports fasting CBG's 90-120 Instructed to bring CPAP mask day of surgery- voices understanding. Denies ever having a card cath or echo, states she is not sure, but may have had a stress test in the past. Doesn't remember when. Request sent to Dr Berdine Addison for last office visit and EKG. Ms Gaertner states she has an appointment with Dr Berdine Addison today. Levada Dy called and informed of  heart rate.

## 2017-06-05 NOTE — Progress Notes (Signed)
Anesthesia Consult:   Pt is a 57 year old female scheduled for left sided L3-4, L4-5 transforaminal lumbar interbody fusion with instrumentation and allograft on 06/07/2017 with Phylliss Bob, MD  - PCP is Iona Beard, MD  PMH includes:  HTN, DM, hyperlipidemia, hyperthyroidism (treated with radioactive iodine 2017), OSA, GERD. Former smoker (quit 2013). BMI 38.5  Medications include: Amlodipine, HCTZ, metformin, Prilosec, simvastatin  BP 117/82   Pulse (!) 121   Temp 36.6 C (Oral)   Resp 20   Ht 5\' 2"  (1.575 m)   Wt 211 lb 3.2 oz (95.8 kg)   SpO2 95%   BMI 38.63 kg/m   - HR was initially 131; lowest HR on recheck 113   Preoperative labs reviewed.   - HbA1c 7.1, glucose 142 - UA is nitrite positive. I notified Butch Penny in Dr. Laurena Bering office.   EKG 06/05/17: Sinus tachycardia (113 bpm)  I evaluated pt in pre-admission testing for tachycardia.  Pt is not symptomatic with tachycardia, does not feel palpitations.  Denies chest pain, dizziness, weakness, syncope, SOB, peripheral edema.  Pt to f/u with PCP this afternoon (previously scheduled appointment).  Pt will notify him of tachycardia.   I spoke with pt 06/06/17 by telephone. She is seeing cardiologist today, and says she needs cardiac testing prior to surgery. I anticipate case will be cancelled.   Willeen Cass, FNP-BC [facility] Short Stay Surgical Center/Anesthesiology Phone: [phone] 06/06/2017 12:26 PM

## 2017-06-06 DIAGNOSIS — E119 Type 2 diabetes mellitus without complications: Secondary | ICD-10-CM | POA: Diagnosis not present

## 2017-06-06 DIAGNOSIS — R000 Tachycardia, unspecified: Secondary | ICD-10-CM | POA: Diagnosis not present

## 2017-06-06 DIAGNOSIS — Z01810 Encounter for preprocedural cardiovascular examination: Secondary | ICD-10-CM | POA: Diagnosis not present

## 2017-06-06 DIAGNOSIS — I10 Essential (primary) hypertension: Secondary | ICD-10-CM | POA: Diagnosis not present

## 2017-06-07 ENCOUNTER — Inpatient Hospital Stay (HOSPITAL_COMMUNITY): Admission: RE | Admit: 2017-06-07 | Source: Ambulatory Visit | Admitting: Orthopedic Surgery

## 2017-06-07 ENCOUNTER — Encounter (HOSPITAL_COMMUNITY): Admission: RE | Payer: Self-pay | Source: Ambulatory Visit

## 2017-06-07 DIAGNOSIS — Z01810 Encounter for preprocedural cardiovascular examination: Secondary | ICD-10-CM | POA: Diagnosis not present

## 2017-06-07 DIAGNOSIS — R000 Tachycardia, unspecified: Secondary | ICD-10-CM | POA: Diagnosis not present

## 2017-06-07 SURGERY — TRANSFORAMINAL LUMBAR INTERBODY FUSION (TLIF) WITH PEDICLE SCREW FIXATION 2 LEVEL
Anesthesia: General | Laterality: Bilateral

## 2017-06-12 DIAGNOSIS — Z01810 Encounter for preprocedural cardiovascular examination: Secondary | ICD-10-CM | POA: Diagnosis not present

## 2017-06-12 DIAGNOSIS — R000 Tachycardia, unspecified: Secondary | ICD-10-CM | POA: Diagnosis not present

## 2017-06-19 ENCOUNTER — Other Ambulatory Visit: Payer: Self-pay | Admitting: Orthopedic Surgery

## 2017-06-21 DIAGNOSIS — H9042 Sensorineural hearing loss, unilateral, left ear, with unrestricted hearing on the contralateral side: Secondary | ICD-10-CM | POA: Diagnosis not present

## 2017-06-22 ENCOUNTER — Ambulatory Visit (INDEPENDENT_AMBULATORY_CARE_PROVIDER_SITE_OTHER): Payer: Medicare Other | Admitting: Otolaryngology

## 2017-06-22 DIAGNOSIS — H9222 Otorrhagia, left ear: Secondary | ICD-10-CM | POA: Diagnosis not present

## 2017-06-22 DIAGNOSIS — H9042 Sensorineural hearing loss, unilateral, left ear, with unrestricted hearing on the contralateral side: Secondary | ICD-10-CM | POA: Diagnosis not present

## 2017-06-28 ENCOUNTER — Encounter (HOSPITAL_COMMUNITY): Payer: Self-pay | Admitting: *Deleted

## 2017-06-28 ENCOUNTER — Other Ambulatory Visit: Payer: Self-pay

## 2017-06-28 DIAGNOSIS — H9042 Sensorineural hearing loss, unilateral, left ear, with unrestricted hearing on the contralateral side: Secondary | ICD-10-CM | POA: Diagnosis not present

## 2017-06-28 DIAGNOSIS — H838X2 Other specified diseases of left inner ear: Secondary | ICD-10-CM | POA: Diagnosis not present

## 2017-06-28 NOTE — Progress Notes (Signed)
Spoke with pt for pre-op call. Pt was seen on 06/05/17 for a PAT appt but surgery was rescheduled due to the fact she needed to see a cardiologist. She states she saw "a female doctor in an office across from the hospital" She can't remember the name. I spoke with Willeen Cass, NP who had seen pt while she was here for her PAT appt. And she thought it was Dr. Einar Gip that was to see the patient. I called Dr. Irven Shelling office and pt was seen by the NP at his office. I requested that the office visit notes and any testing done be faxed to [place].  Pt denies any recent chest pain or sob. Pt is diabetic. Her last A1C was 7.1 on 06/05/17. She states her fasting blood sugar is usually between 90-120. Pt instructed to check her blood sugar tomorrow morning when she gets up and every 2 hours until she leaves for the hospital. If blood sugar is 70 or below, treat with 1/2 cup of clear juice (apple or cranberry) and recheck blood sugar 15 minutes after drinking juice. If blood sugar continues to be 70 or below, call the Short Stay department and ask to speak to a nurse. Pt voiced understanding.

## 2017-06-28 NOTE — Progress Notes (Signed)
Anesthesia Chart Review:  Pt is a same day work up   Case:  [id number] Date/Time:  06/29/17 1045   Procedure:  LEFT -SIDED LUMBAR 3-4, LUMBAR 4-5 TRANSFORAMINAL LUMBAR INTERBODY FUSION WITH INSTRUMENTATION AND ALLOGRAFT  TIME REQUESTED 6 HOURS (Left )   Anesthesia type:  General   Pre-op diagnosis:  BILATERAL LEG PAIN   Location:  [place] [facility] [place] [facility]   Surgeon:  Phylliss Bob, MD      DISCUSSION:  - Pt is a 57 year old female.  - Surgery was originally scheduled for 06/07/17 but was postponed in order for pt to be evaluated by cardiology for tachycardia.  - Pt had normal stress test and echo as part of cardiology work up. Pt cleared for surgery.    PROVIDERSIona Beard, MD  Saw Karla Kand, NP with [facility] cardiovascular on 06/06/2017 for tachycardia.  Stress test and echo ordered, results below.  Patient then cleared for surgery.   LABS: Will be obtained day of surgery   IMAGES:  CXR 06/05/17: Stable exam.  No active cardiopulmonary disease.   EKG 06/05/17: Sinus tachycardia (112 bpm)   CV:  Nuclear stress test 06/12/2017 [facility] cardiovascular): 1.  Lexiscan stress test performed.  Exercise capacity not assessed.  No stress symptoms reported.  Normal BP.  Stress EKG nondiagnostic for ischemia as it is a pharmacologic stress. 2.  Overall quality of study is excellent.  No evidence of abnormal lung activity.  Stress and rest SPECT images demonstrate homogeneous tracer distribution throughout the myocardium.  Gated SPECT imaging reveals normal myocardial thickening and wall motion.  LVEF normal 57%. 3.  Low risk study  Echo 06/07/2017 [facility] cardiovascular): 1.  LV cavity normal in size.  Mild concentric LVH.  Normal global wall motion.  EF 55-60%.  Doppler evidence of grade high diastolic dysfunction. 2.  Trace mitral regurgitation. 3.  Mild tricuspid regurgitation.  No evidence of pulmonary hypertension   Past Medical History:  Diagnosis Date  . Arthritis    "knees and hands" (07/19/2012)  . Boil, back    "popped it 07/08/2012 & it got infected" (07/19/2012)  . Chronic lower back pain   . Depression   . GERD (gastroesophageal reflux disease)   . High cholesterol    "don't think I take the RX anymore" (07/19/2012)  . Hypertension   . Hyperthyroidism    "overactive; never put me on anything for it" (07/19/2012)  . Migraines    "only when I took BCP" (07/19/2012)  . Pneumonia    "a couple times" (07/19/2012)  . Sleep apnea    "suppose to wear mask; I don't" (07/19/2012)  now wears 06-05-17  . Type II diabetes mellitus ([place])     Past Surgical History:  Procedure Laterality Date  . BREAST BIOPSY Left    u/s guided core biopsy  . CESAREAN SECTION  09/22/1980  . COLONOSCOPY    . ESOPHAGOGASTRODUODENOSCOPY    . INCISION AND DRAINAGE ABSCESS N/A 07/20/2012   Procedure: INCISION AND DRAINAGE ABSCESS;  Surgeon: Ralene Ok, MD;  Location: [place];  Service: General;  Laterality: N/A;  . KNEE ARTHROSCOPY W/ MENISCAL REPAIR Left 2001  . LAPAROSCOPY FOR ECTOPIC PREGNANCY  ~ 1987  . REPLACEMENT TOTAL KNEE Left 2009  . ruptured disc    . TUBAL LIGATION  ~ 1987    MEDICATIONS: No current facility-administered medications for this encounter.    Marland Kitchen amLODipine (NORVASC) 5 MG tablet  . Ascorbic Acid (VITAMIN C) 1000 MG  tablet  . Cholecalciferol (VITAMIN D3 PO)  . hydrochlorothiazide (HYDRODIURIL) 25 MG tablet  . metFORMIN (GLUCOPHAGE-XR) 500 MG 24 hr tablet  . methocarbamol (ROBAXIN) 750 MG tablet  . omeprazole (PRILOSEC) 20 MG capsule  . oxybutynin (DITROPAN-XL) 10 MG 24 hr tablet  . simvastatin (ZOCOR) 80 MG tablet  . traMADol (ULTRAM) 50 MG tablet  . acetaminophen (TYLENOL 8 HOUR ARTHRITIS PAIN) 650 MG CR tablet  . diclofenac (VOLTAREN) 75 MG EC tablet  . oxyCODONE-acetaminophen (PERCOCET/ROXICET) 5-325 MG tablet    If labs acceptable day of surgery, I anticipate pt can proceed with surgery as scheduled.    Willeen Cass, FNP-BC [facility] Short  Stay Surgical Center/Anesthesiology Phone: [phone] 06/28/2017 3:17 PM

## 2017-06-29 ENCOUNTER — Inpatient Hospital Stay (HOSPITAL_COMMUNITY)

## 2017-06-29 ENCOUNTER — Other Ambulatory Visit: Payer: Self-pay

## 2017-06-29 ENCOUNTER — Inpatient Hospital Stay (HOSPITAL_COMMUNITY)
Admission: RE | Disposition: A | Discharge status: Home / Self Care | Payer: Self-pay | Source: Ambulatory Visit | Attending: Orthopedic Surgery

## 2017-06-29 ENCOUNTER — Inpatient Hospital Stay (HOSPITAL_COMMUNITY): Admitting: Emergency Medicine

## 2017-06-29 ENCOUNTER — Encounter (HOSPITAL_COMMUNITY): Payer: Self-pay

## 2017-06-29 ENCOUNTER — Inpatient Hospital Stay (HOSPITAL_COMMUNITY)
Admission: RE | Admit: 2017-06-29 | Discharge: 2017-06-30 | DRG: 455 | Disposition: A | Discharge status: Home / Self Care | Source: Ambulatory Visit | Attending: Orthopedic Surgery | Admitting: Orthopedic Surgery

## 2017-06-29 DIAGNOSIS — Z7984 Long term (current) use of oral hypoglycemic drugs: Secondary | ICD-10-CM

## 2017-06-29 DIAGNOSIS — K219 Gastro-esophageal reflux disease without esophagitis: Secondary | ICD-10-CM | POA: Diagnosis present

## 2017-06-29 DIAGNOSIS — M4316 Spondylolisthesis, lumbar region: Secondary | ICD-10-CM | POA: Diagnosis present

## 2017-06-29 DIAGNOSIS — I10 Essential (primary) hypertension: Secondary | ICD-10-CM | POA: Diagnosis present

## 2017-06-29 DIAGNOSIS — G4730 Sleep apnea, unspecified: Secondary | ICD-10-CM | POA: Diagnosis present

## 2017-06-29 DIAGNOSIS — M79604 Pain in right leg: Secondary | ICD-10-CM | POA: Diagnosis present

## 2017-06-29 DIAGNOSIS — E7800 Pure hypercholesterolemia, unspecified: Secondary | ICD-10-CM | POA: Diagnosis present

## 2017-06-29 DIAGNOSIS — Z419 Encounter for procedure for purposes other than remedying health state, unspecified: Secondary | ICD-10-CM

## 2017-06-29 DIAGNOSIS — M48062 Spinal stenosis, lumbar region with neurogenic claudication: Secondary | ICD-10-CM | POA: Diagnosis not present

## 2017-06-29 DIAGNOSIS — E119 Type 2 diabetes mellitus without complications: Secondary | ICD-10-CM | POA: Diagnosis present

## 2017-06-29 DIAGNOSIS — Z87891 Personal history of nicotine dependence: Secondary | ICD-10-CM

## 2017-06-29 DIAGNOSIS — M62830 Muscle spasm of back: Secondary | ICD-10-CM | POA: Diagnosis not present

## 2017-06-29 DIAGNOSIS — M5416 Radiculopathy, lumbar region: Secondary | ICD-10-CM | POA: Diagnosis present

## 2017-06-29 DIAGNOSIS — M5410 Radiculopathy, site unspecified: Secondary | ICD-10-CM | POA: Diagnosis present

## 2017-06-29 LAB — GLUCOSE, CAPILLARY
GLUCOSE-CAPILLARY: 166 mg/dL — AB (ref 65–99)
Glucose-Capillary: 115 mg/dL — ABNORMAL HIGH (ref 65–99)
Glucose-Capillary: 97 mg/dL (ref 65–99)
Glucose-Capillary: 207 mg/dL — ABNORMAL HIGH (ref 65–99)

## 2017-06-29 LAB — PROTIME-INR
INR: 0.91
Prothrombin Time: 12.2 seconds (ref 11.4–15.2)

## 2017-06-29 LAB — URINALYSIS, ROUTINE W REFLEX MICROSCOPIC
BILIRUBIN URINE: NEGATIVE
GLUCOSE, UA: NEGATIVE mg/dL
HGB URINE DIPSTICK: NEGATIVE
KETONES UR: NEGATIVE mg/dL
Leukocytes, UA: NEGATIVE
NITRITE: NEGATIVE
PH: 7 (ref 5.0–8.0)
Protein, ur: NEGATIVE mg/dL
SPECIFIC GRAVITY, URINE: 1.018 (ref 1.005–1.030)

## 2017-06-29 LAB — TYPE AND SCREEN
ABO/RH(D): O POS
Antibody Screen: NEGATIVE

## 2017-06-29 LAB — CBC WITH DIFFERENTIAL/PLATELET
BASOS PCT: 0 %
Basophils Absolute: 0 10*3/uL (ref 0.0–0.1)
EOS ABS: 0.3 10*3/uL (ref 0.0–0.7)
EOS PCT: 2 %
HCT: 39.1 % (ref 36.0–46.0)
HEMOGLOBIN: 12.9 g/dL (ref 12.0–15.0)
LYMPHS ABS: 3 10*3/uL (ref 0.7–4.0)
Lymphocytes Relative: 27 %
MCH: 30.5 pg (ref 26.0–34.0)
MCHC: 33 g/dL (ref 30.0–36.0)
MCV: 92.4 fL (ref 78.0–100.0)
Monocytes Absolute: 0.7 10*3/uL (ref 0.1–1.0)
Monocytes Relative: 6 %
NEUTROS PCT: 65 %
Neutro Abs: 7.3 10*3/uL (ref 1.7–7.7)
Platelets: 462 10*3/uL — ABNORMAL HIGH (ref 150–400)
RBC: 4.23 MIL/uL (ref 3.87–5.11)
RDW: 15.4 % (ref 11.5–15.5)
WBC: 11.3 10*3/uL — AB (ref 4.0–10.5)

## 2017-06-29 LAB — APTT: APTT: 28 s (ref 24–36)

## 2017-06-29 LAB — COMPREHENSIVE METABOLIC PANEL
ALK PHOS: 65 U/L (ref 38–126)
ALT: 20 U/L (ref 14–54)
AST: 16 U/L (ref 15–41)
Albumin: 3.7 g/dL (ref 3.5–5.0)
Anion gap: 11 (ref 5–15)
BILIRUBIN TOTAL: 0.7 mg/dL (ref 0.3–1.2)
BUN: 13 mg/dL (ref 6–20)
CALCIUM: 9.1 mg/dL (ref 8.9–10.3)
CHLORIDE: 106 mmol/L (ref 101–111)
CO2: 26 mmol/L (ref 22–32)
CREATININE: 0.81 mg/dL (ref 0.44–1.00)
Glucose, Bld: 115 mg/dL — ABNORMAL HIGH (ref 65–99)
Potassium: 3.9 mmol/L (ref 3.5–5.1)
Sodium: 143 mmol/L (ref 135–145)
TOTAL PROTEIN: 6.9 g/dL (ref 6.5–8.1)

## 2017-06-29 IMAGING — CR DG LUMBAR SPINE 1V
1 series · 1 of 1 positions shown · non-contrast
Comparison: [DATE]

CLINICAL DATA: Lumbar localization

EXAM:
LUMBAR SPINE - 1 VIEW

[lateral]
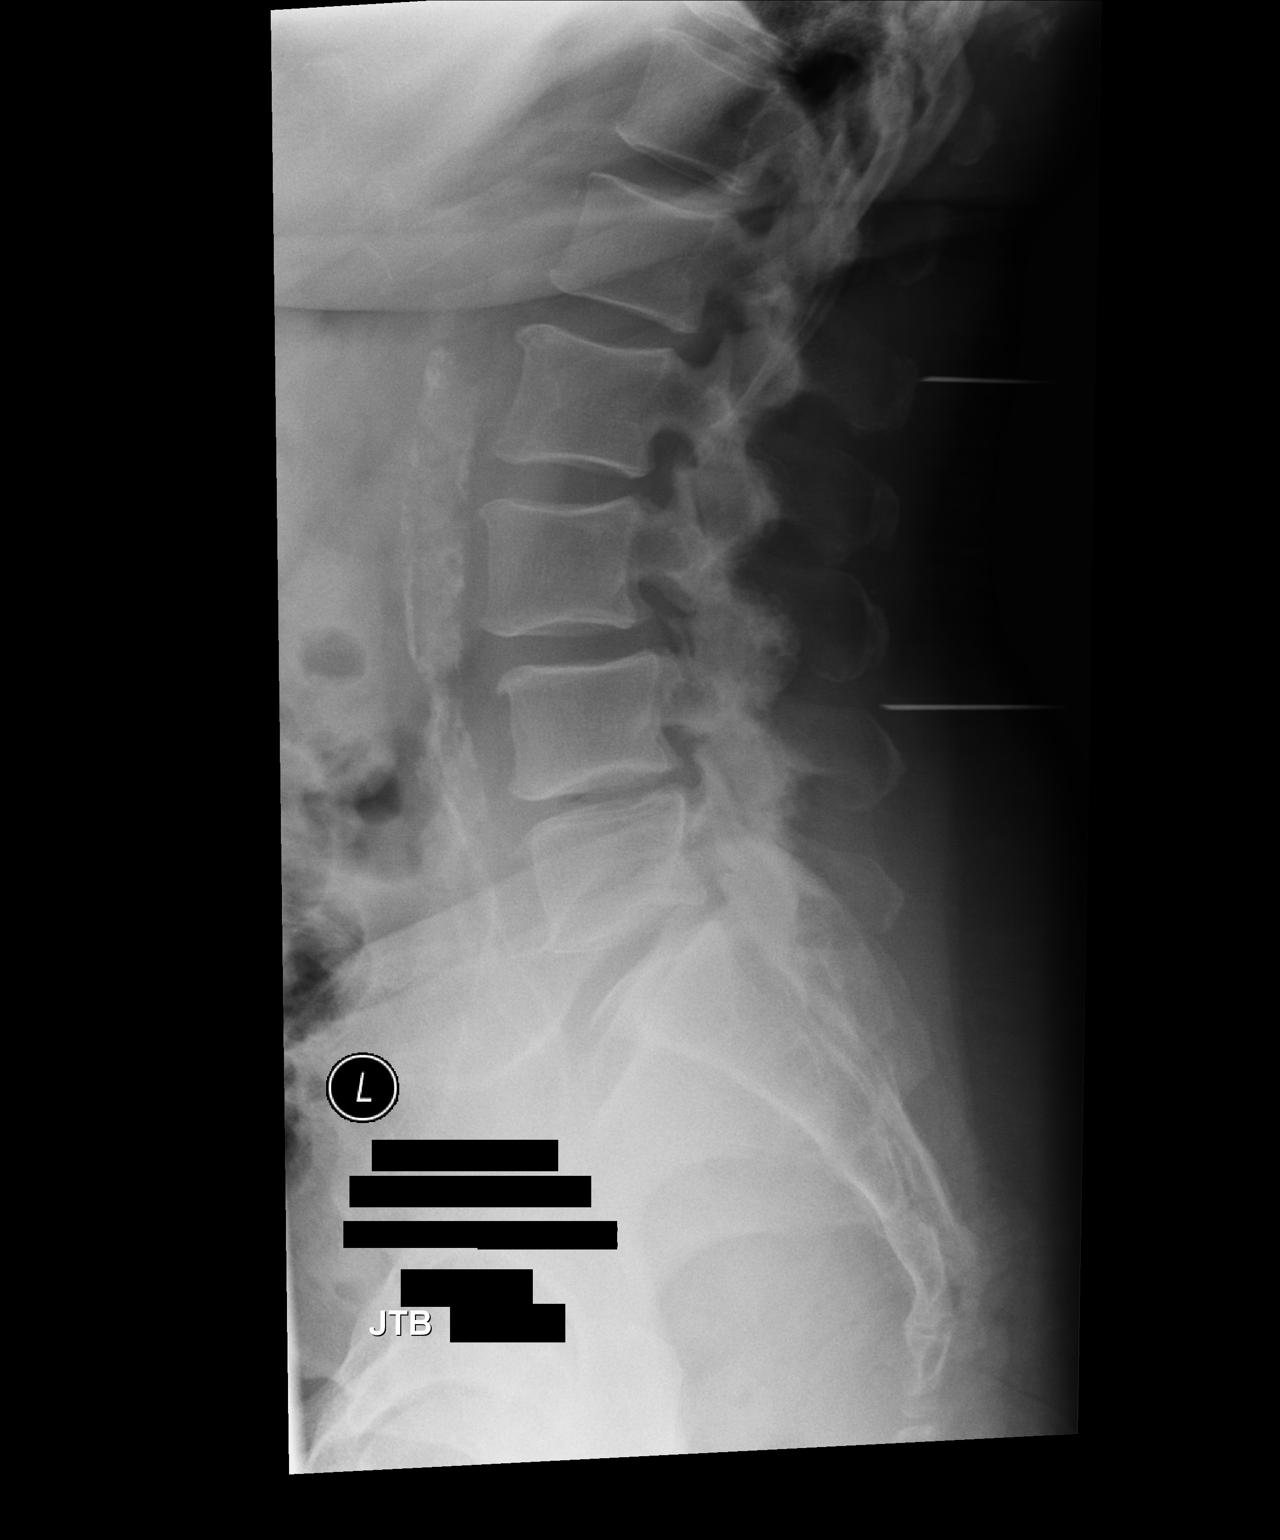

[1 of 1 positions shown; findings below may reference images not displayed]

FINDINGS: Lateral image of the lumbar spine obtained intraoperatively reveals
radiopaque needles in the posterior soft tissues at the L1 spinous
process as well as inferiorly just above the L4 spinous process.
Mild anterolisthesis of L3 on L4 is noted. Disc space narrowing at
L4-5 is seen as well. The numbering nomenclature is similar to that
used on prior MRI.
IMPRESSION: Intraoperative localization as described.

## 2017-06-29 SURGERY — POSTERIOR LUMBAR FUSION 2 LEVEL
Anesthesia: General | Site: Spine Lumbar | Laterality: Left

## 2017-06-29 MED ORDER — PANTOPRAZOLE SODIUM 40 MG PO TBEC: 40.0000 mg | DELAYED_RELEASE_TABLET | Freq: Every day | ORAL | Status: DC

## 2017-06-29 MED ORDER — ONDANSETRON HCL 4 MG/2ML IJ SOLN
INTRAMUSCULAR | Status: AC
  Filled 2017-06-29: qty 2

## 2017-06-29 MED ORDER — METHYLENE BLUE 0.5 % INJ SOLN
INTRAVENOUS | Status: AC
  Filled 2017-06-29: qty 10

## 2017-06-29 MED ORDER — PROPOFOL 500 MG/50ML IV EMUL
INTRAVENOUS | Status: DC | PRN
  Administered 2017-06-29: 100 ug/kg/min via INTRAVENOUS

## 2017-06-29 MED ORDER — FLEET ENEMA 7-19 GM/118ML RE ENEM: 1.0000 | ENEMA | Freq: Once | RECTAL | Status: DC | PRN

## 2017-06-29 MED ORDER — INSULIN ASPART 100 UNIT/ML ~~LOC~~ SOLN
0.0000 [IU] | Freq: Every day | SUBCUTANEOUS | Status: DC
  Administered 2017-06-29: 2 [IU] via SUBCUTANEOUS

## 2017-06-29 MED ORDER — FENTANYL CITRATE (PF) 250 MCG/5ML IJ SOLN
INTRAMUSCULAR | Status: AC
  Filled 2017-06-29: qty 5

## 2017-06-29 MED ORDER — ROCURONIUM BROMIDE 50 MG/5ML IV SOLN
INTRAVENOUS | Status: AC
  Filled 2017-06-29: qty 1

## 2017-06-29 MED ORDER — MIDAZOLAM HCL 2 MG/2ML IJ SOLN: 0.5000 mg | Freq: Once | INTRAMUSCULAR | Status: DC | PRN

## 2017-06-29 MED ORDER — METHYLENE BLUE 0.5 % INJ SOLN
INTRAVENOUS | Status: DC | PRN
  Administered 2017-06-29: .1 mL via INTRADERMAL

## 2017-06-29 MED ORDER — PHENYLEPHRINE 40 MCG/ML (10ML) SYRINGE FOR IV PUSH (FOR BLOOD PRESSURE SUPPORT)
PREFILLED_SYRINGE | INTRAVENOUS | Status: DC | PRN
  Administered 2017-06-29 (×3): 80 ug via INTRAVENOUS

## 2017-06-29 MED ORDER — DEXAMETHASONE SODIUM PHOSPHATE 10 MG/ML IJ SOLN
INTRAMUSCULAR | Status: AC
  Filled 2017-06-29: qty 1

## 2017-06-29 MED ORDER — SODIUM CHLORIDE 0.9% FLUSH
3.0000 mL | Freq: Two times a day (BID) | INTRAVENOUS | Status: DC
  Administered 2017-06-29: 3 mL via INTRAVENOUS

## 2017-06-29 MED ORDER — MORPHINE SULFATE (PF) 4 MG/ML IV SOLN: 1.0000 mg | INTRAVENOUS | Status: DC | PRN

## 2017-06-29 MED ORDER — ONDANSETRON HCL 4 MG/2ML IJ SOLN
INTRAMUSCULAR | Status: DC | PRN
  Administered 2017-06-29: 4 mg via INTRAVENOUS

## 2017-06-29 MED ORDER — BISACODYL 5 MG PO TBEC: 5.0000 mg | DELAYED_RELEASE_TABLET | Freq: Every day | ORAL | Status: DC | PRN

## 2017-06-29 MED ORDER — METFORMIN HCL ER 500 MG PO TB24
500.0000 mg | ORAL_TABLET | Freq: Two times a day (BID) | ORAL | Status: DC
  Administered 2017-06-29 – 2017-06-30 (×2): 500 mg via ORAL
  Filled 2017-06-29 (×2): qty 1

## 2017-06-29 MED ORDER — CEFAZOLIN SODIUM-DEXTROSE 2-4 GM/100ML-% IV SOLN
2.0000 g | INTRAVENOUS | Status: AC
  Administered 2017-06-29 (×2): 2 g via INTRAVENOUS
  Filled 2017-06-29: qty 100

## 2017-06-29 MED ORDER — MIDAZOLAM HCL 5 MG/5ML IJ SOLN
INTRAMUSCULAR | Status: DC | PRN
  Administered 2017-06-29: 2 mg via INTRAVENOUS

## 2017-06-29 MED ORDER — ACETAMINOPHEN 325 MG PO TABS: 650.0000 mg | ORAL_TABLET | ORAL | Status: DC | PRN

## 2017-06-29 MED ORDER — OXYCODONE-ACETAMINOPHEN 5-325 MG PO TABS
1.0000 | ORAL_TABLET | ORAL | Status: DC | PRN
  Administered 2017-06-29 – 2017-06-30 (×4): 2 via ORAL
  Filled 2017-06-29 (×4): qty 2

## 2017-06-29 MED ORDER — PROPOFOL 10 MG/ML IV BOLUS
INTRAVENOUS | Status: DC | PRN
  Administered 2017-06-29: 150 mg via INTRAVENOUS

## 2017-06-29 MED ORDER — ONDANSETRON HCL 4 MG/2ML IJ SOLN: 4.0000 mg | Freq: Four times a day (QID) | INTRAMUSCULAR | Status: DC | PRN

## 2017-06-29 MED ORDER — ZOLPIDEM TARTRATE 5 MG PO TABS: 5.0000 mg | ORAL_TABLET | Freq: Every evening | ORAL | Status: DC | PRN

## 2017-06-29 MED ORDER — LIDOCAINE 2% (20 MG/ML) 5 ML SYRINGE
INTRAMUSCULAR | Status: DC | PRN
  Administered 2017-06-29: 100 mg via INTRAVENOUS

## 2017-06-29 MED ORDER — LACTATED RINGERS IV SOLN
INTRAVENOUS | Status: DC | PRN
  Administered 2017-06-29: 15:00:00 via INTRAVENOUS

## 2017-06-29 MED ORDER — HYDROCHLOROTHIAZIDE 25 MG PO TABS
12.5000 mg | ORAL_TABLET | Freq: Every day | ORAL | Status: DC
  Filled 2017-06-29: qty 1

## 2017-06-29 MED ORDER — ATORVASTATIN CALCIUM 20 MG PO TABS: 40.0000 mg | ORAL_TABLET | Freq: Every day | ORAL | Status: DC

## 2017-06-29 MED ORDER — FENTANYL CITRATE (PF) 100 MCG/2ML IJ SOLN
INTRAMUSCULAR | Status: DC | PRN
  Administered 2017-06-29: 100 ug via INTRAVENOUS
  Administered 2017-06-29 (×4): 50 ug via INTRAVENOUS
  Administered 2017-06-29: 100 ug via INTRAVENOUS
  Administered 2017-06-29: 50 ug via INTRAVENOUS
  Administered 2017-06-29 (×2): 100 ug via INTRAVENOUS

## 2017-06-29 MED ORDER — CEFAZOLIN SODIUM-DEXTROSE 2-4 GM/100ML-% IV SOLN
2.0000 g | Freq: Three times a day (TID) | INTRAVENOUS | Status: AC
  Administered 2017-06-29 – 2017-06-30 (×2): 2 g via INTRAVENOUS
  Filled 2017-06-29 (×2): qty 100

## 2017-06-29 MED ORDER — 0.9 % SODIUM CHLORIDE (POUR BTL) OPTIME
TOPICAL | Status: DC | PRN
  Administered 2017-06-29 (×6): 1000 mL

## 2017-06-29 MED ORDER — DOCUSATE SODIUM 100 MG PO CAPS
100.0000 mg | ORAL_CAPSULE | Freq: Two times a day (BID) | ORAL | Status: DC
  Administered 2017-06-29: 100 mg via ORAL
  Filled 2017-06-29: qty 1

## 2017-06-29 MED ORDER — HEMOSTATIC AGENTS (NO CHARGE) OPTIME
TOPICAL | Status: DC | PRN
  Administered 2017-06-29: 1 via TOPICAL

## 2017-06-29 MED ORDER — SODIUM CHLORIDE 0.9 % IV SOLN: 250.0000 mL | INTRAVENOUS | Status: DC

## 2017-06-29 MED ORDER — SODIUM CHLORIDE 0.9 % IV SOLN
INTRAVENOUS | Status: DC | PRN
  Administered 2017-06-29: 17:00:00 via INTRAVENOUS

## 2017-06-29 MED ORDER — MIDAZOLAM HCL 2 MG/2ML IJ SOLN
INTRAMUSCULAR | Status: AC
  Filled 2017-06-29: qty 2

## 2017-06-29 MED ORDER — AMLODIPINE BESYLATE 5 MG PO TABS: 2.5000 mg | ORAL_TABLET | Freq: Every day | ORAL | Status: DC

## 2017-06-29 MED ORDER — ALBUMIN HUMAN 5 % IV SOLN
INTRAVENOUS | Status: DC | PRN
  Administered 2017-06-29: 15:00:00 via INTRAVENOUS

## 2017-06-29 MED ORDER — SODIUM CHLORIDE 0.9 % IV SOLN: INTRAVENOUS | Status: DC | PRN

## 2017-06-29 MED ORDER — PHENYLEPHRINE HCL 10 MG/ML IJ SOLN
INTRAVENOUS | Status: DC | PRN
  Administered 2017-06-29: 75 ug/min via INTRAVENOUS

## 2017-06-29 MED ORDER — ARTIFICIAL TEARS OPHTHALMIC OINT
TOPICAL_OINTMENT | OPHTHALMIC | Status: AC
  Filled 2017-06-29: qty 7

## 2017-06-29 MED ORDER — ROCURONIUM BROMIDE 100 MG/10ML IV SOLN
INTRAVENOUS | Status: DC | PRN
  Administered 2017-06-29: 30 mg via INTRAVENOUS
  Administered 2017-06-29: 50 mg via INTRAVENOUS

## 2017-06-29 MED ORDER — DEXAMETHASONE SODIUM PHOSPHATE 4 MG/ML IJ SOLN
INTRAMUSCULAR | Status: DC | PRN
  Administered 2017-06-29: 10 mg via INTRAVENOUS

## 2017-06-29 MED ORDER — VITAMIN D3 25 MCG (1000 UNIT) PO TABS
1000.0000 [IU] | ORAL_TABLET | Freq: Every evening | ORAL | Status: DC
  Filled 2017-06-29 (×2): qty 1

## 2017-06-29 MED ORDER — INSULIN ASPART 100 UNIT/ML ~~LOC~~ SOLN
0.0000 [IU] | Freq: Three times a day (TID) | SUBCUTANEOUS | Status: DC
  Administered 2017-06-30: 2 [IU] via SUBCUTANEOUS

## 2017-06-29 MED ORDER — BUPIVACAINE LIPOSOME 1.3 % IJ SUSP
20.0000 mL | INTRAMUSCULAR | Status: AC
  Administered 2017-06-29: 20 mL
  Filled 2017-06-29: qty 20

## 2017-06-29 MED ORDER — THROMBIN 20000 UNITS EX SOLR
CUTANEOUS | Status: AC
  Filled 2017-06-29: qty 20000

## 2017-06-29 MED ORDER — SUGAMMADEX SODIUM 200 MG/2ML IV SOLN
INTRAVENOUS | Status: AC
  Filled 2017-06-29: qty 2

## 2017-06-29 MED ORDER — HYDROMORPHONE HCL 2 MG/ML IJ SOLN
INTRAMUSCULAR | Status: AC
  Administered 2017-06-29: 0.5 mg via INTRAVENOUS
  Filled 2017-06-29: qty 1

## 2017-06-29 MED ORDER — MEPERIDINE HCL 50 MG/ML IJ SOLN: 6.2500 mg | INTRAMUSCULAR | Status: DC | PRN

## 2017-06-29 MED ORDER — POTASSIUM CHLORIDE IN NACL 20-0.9 MEQ/L-% IV SOLN: INTRAVENOUS | Status: DC

## 2017-06-29 MED ORDER — PHENOL 1.4 % MT LIQD: 1.0000 | OROMUCOSAL | Status: DC | PRN

## 2017-06-29 MED ORDER — MENTHOL 3 MG MT LOZG: 1.0000 | LOZENGE | OROMUCOSAL | Status: DC | PRN

## 2017-06-29 MED ORDER — BUPIVACAINE-EPINEPHRINE (PF) 0.25% -1:200000 IJ SOLN
INTRAMUSCULAR | Status: AC
  Filled 2017-06-29: qty 90

## 2017-06-29 MED ORDER — PROMETHAZINE HCL 25 MG/ML IJ SOLN: 6.2500 mg | INTRAMUSCULAR | Status: DC | PRN

## 2017-06-29 MED ORDER — VITAMIN C 500 MG PO TABS
1000.0000 mg | ORAL_TABLET | Freq: Every day | ORAL | Status: DC
  Filled 2017-06-29: qty 2

## 2017-06-29 MED ORDER — ONDANSETRON HCL 4 MG PO TABS: 4.0000 mg | ORAL_TABLET | Freq: Four times a day (QID) | ORAL | Status: DC | PRN

## 2017-06-29 MED ORDER — DIAZEPAM 5 MG PO TABS
5.0000 mg | ORAL_TABLET | Freq: Four times a day (QID) | ORAL | Status: DC | PRN
  Administered 2017-06-30: 5 mg via ORAL
  Filled 2017-06-29: qty 1

## 2017-06-29 MED ORDER — FENTANYL CITRATE (PF) 250 MCG/5ML IJ SOLN
INTRAMUSCULAR | Status: AC
Start: 2017-06-29 — End: ?
  Filled 2017-06-29: qty 5

## 2017-06-29 MED ORDER — POVIDONE-IODINE 7.5 % EX SOLN
Freq: Once | CUTANEOUS | Status: DC
  Filled 2017-06-29: qty 118

## 2017-06-29 MED ORDER — ACETAMINOPHEN 650 MG RE SUPP: 650.0000 mg | RECTAL | Status: DC | PRN

## 2017-06-29 MED ORDER — HYDROMORPHONE HCL 2 MG/ML IJ SOLN
0.2500 mg | INTRAMUSCULAR | Status: DC | PRN
Start: 2017-06-29 — End: 2017-06-29
  Administered 2017-06-29: 0.5 mg via INTRAVENOUS

## 2017-06-29 MED ORDER — ALUM & MAG HYDROXIDE-SIMETH 200-200-20 MG/5ML PO SUSP: 30.0000 mL | Freq: Four times a day (QID) | ORAL | Status: DC | PRN

## 2017-06-29 MED ORDER — LIDOCAINE 2% (20 MG/ML) 5 ML SYRINGE
INTRAMUSCULAR | Status: AC
  Filled 2017-06-29: qty 5

## 2017-06-29 MED ORDER — BUPIVACAINE-EPINEPHRINE 0.25% -1:200000 IJ SOLN
INTRAMUSCULAR | Status: DC | PRN
  Administered 2017-06-29: 20 mL
  Administered 2017-06-29: 7 mL

## 2017-06-29 MED ORDER — LACTATED RINGERS IV SOLN
INTRAVENOUS | Status: DC | PRN
  Administered 2017-06-29 (×3): via INTRAVENOUS

## 2017-06-29 MED ORDER — SODIUM CHLORIDE 0.9% FLUSH: 3.0000 mL | INTRAVENOUS | Status: DC | PRN

## 2017-06-29 MED ORDER — THROMBIN (RECOMBINANT) 20000 UNITS EX SOLR
CUTANEOUS | Status: DC | PRN
  Administered 2017-06-29: 20000 [IU] via TOPICAL

## 2017-06-29 MED ORDER — SENNOSIDES-DOCUSATE SODIUM 8.6-50 MG PO TABS: 1.0000 | ORAL_TABLET | Freq: Every evening | ORAL | Status: DC | PRN

## 2017-06-29 MED ORDER — BUPIVACAINE LIPOSOME 1.3 % IJ SUSP: INTRAMUSCULAR | Status: DC | PRN

## 2017-06-29 MED ORDER — OXYBUTYNIN CHLORIDE ER 10 MG PO TB24
10.0000 mg | ORAL_TABLET | Freq: Every evening | ORAL | Status: DC
  Administered 2017-06-29: 10 mg via ORAL
  Filled 2017-06-29 (×2): qty 1

## 2017-06-29 SURGICAL SUPPLY — 92 items
APL SKNCLS STERI-STRIP NONHPOA (GAUZE/BANDAGES/DRESSINGS) ×1
BENZOIN TINCTURE PRP APPL 2/3 (GAUZE/BANDAGES/DRESSINGS) ×1 IMPLANT
BLADE CLIPPER SURG (BLADE) IMPLANT
BONE VIVIGEN FORMABLE 10CC (Bone Implant) ×2 IMPLANT
BONE VIVIGEN FORMABLE 5.4CC (Bone Implant) ×2 IMPLANT
BUR PRESCISION 1.7 ELITE (BURR) ×1 IMPLANT
BUR ROUND FLUTED 5 RND (BURR) ×1 IMPLANT
BUR ROUND PRECISION 4.0 (BURR) IMPLANT
BUR SABER RD CUTTING 3.0 (BURR) IMPLANT
CAGE CONCORDE BULLET 9X11X27 (Cage) ×4 IMPLANT
CAGE SPNL 5D BLT NOSE 27X9X11 (Cage) IMPLANT
CARTRIDGE OIL MAESTRO DRILL (MISCELLANEOUS) ×2 IMPLANT
CONT SPEC 4OZ CLIKSEAL STRL BL (MISCELLANEOUS) ×1 IMPLANT
COVER MAYO STAND STRL (DRAPES) ×1 IMPLANT
COVER SURGICAL LIGHT HANDLE (MISCELLANEOUS) ×2 IMPLANT
DIFFUSER DRILL AIR PNEUMATIC (MISCELLANEOUS) ×2 IMPLANT
DRAIN CHANNEL 15F RND FF W/TCR (WOUND CARE) ×1 IMPLANT
DRAPE C-ARM 42X72 X-RAY (DRAPES) ×2 IMPLANT
DRAPE POUCH INSTRU U-SHP 10X18 (DRAPES) ×2 IMPLANT
DRAPE SURG 17X23 STRL (DRAPES) ×8 IMPLANT
DRSG MEPILEX BORDER 4X12 (GAUZE/BANDAGES/DRESSINGS) IMPLANT
DRSG MEPILEX BORDER 4X8 (GAUZE/BANDAGES/DRESSINGS) IMPLANT
DURAPREP 26ML APPLICATOR (WOUND CARE) ×2 IMPLANT
ELECT BLADE 4.0 EZ CLEAN MEGAD (MISCELLANEOUS) ×2
ELECT CAUTERY BLADE 6.4 (BLADE) ×4 IMPLANT
ELECT REM PT RETURN 9FT ADLT (ELECTROSURGICAL) ×2
ELECTRODE BLDE 4.0 EZ CLN MEGD (MISCELLANEOUS) ×1 IMPLANT
ELECTRODE REM PT RTRN 9FT ADLT (ELECTROSURGICAL) ×1 IMPLANT
EVACUATOR SILICONE 100CC (DRAIN) ×1 IMPLANT
FEE INTRAOP MONITOR IMPULS NCS (MISCELLANEOUS) IMPLANT
GAUZE SPONGE 4X4 12PLY STRL (GAUZE/BANDAGES/DRESSINGS) ×2 IMPLANT
GAUZE SPONGE 4X4 16PLY XRAY LF (GAUZE/BANDAGES/DRESSINGS) ×4 IMPLANT
GLOVE BIO SURGEON STRL SZ7 (GLOVE) ×1 IMPLANT
GLOVE BIO SURGEON STRL SZ8 (GLOVE) ×1 IMPLANT
GLOVE BIOGEL PI IND STRL 7.0 (GLOVE) ×1 IMPLANT
GLOVE BIOGEL PI IND STRL 8 (GLOVE) ×1 IMPLANT
GLOVE BIOGEL PI INDICATOR 7.0 (GLOVE) ×1
GLOVE BIOGEL PI INDICATOR 8 (GLOVE) ×1
GOWN STRL REUS W/ TWL LRG LVL3 (GOWN DISPOSABLE) ×2 IMPLANT
GOWN STRL REUS W/ TWL XL LVL3 (GOWN DISPOSABLE) ×1 IMPLANT
GOWN STRL REUS W/TWL LRG LVL3 (GOWN DISPOSABLE) ×6
GOWN STRL REUS W/TWL XL LVL3 (GOWN DISPOSABLE) ×2
GRAFT BNE MATRIX VG FRMBL L 10 (Bone Implant) IMPLANT
GRAFT BNE MATRIX VG FRMBL MD 5 (Bone Implant) IMPLANT
INTRAOP MONITOR FEE IMPULS NCS (MISCELLANEOUS) ×1
INTRAOP MONITOR FEE IMPULSE (MISCELLANEOUS) ×1
IV CATH 14GX2 1/4 (CATHETERS) ×2 IMPLANT
KIT BASIN OR (CUSTOM PROCEDURE TRAY) ×2 IMPLANT
KIT POSITION SURG JACKSON T1 (MISCELLANEOUS) ×2 IMPLANT
KIT TURNOVER KIT B (KITS) ×2 IMPLANT
MARKER SKIN DUAL TIP RULER LAB (MISCELLANEOUS) ×2 IMPLANT
NDL HYPO 25GX1X1/2 BEV (NEEDLE) ×1 IMPLANT
NDL SPNL 18GX3.5 QUINCKE PK (NEEDLE) ×2 IMPLANT
NEEDLE 22X1 1/2 (OR ONLY) (NEEDLE) ×2 IMPLANT
NEEDLE HYPO 25GX1X1/2 BEV (NEEDLE) ×2 IMPLANT
NEEDLE SPNL 18GX3.5 QUINCKE PK (NEEDLE) ×4 IMPLANT
NS IRRIG 1000ML POUR BTL (IV SOLUTION) ×2 IMPLANT
OIL CARTRIDGE MAESTRO DRILL (MISCELLANEOUS) ×2
PACK LAMINECTOMY ORTHO (CUSTOM PROCEDURE TRAY) ×2 IMPLANT
PACK UNIVERSAL I (CUSTOM PROCEDURE TRAY) ×2 IMPLANT
PAD ARMBOARD 7.5X6 YLW CONV (MISCELLANEOUS) ×4 IMPLANT
PATTIES SURGICAL .5 X1 (DISPOSABLE) ×2 IMPLANT
PATTIES SURGICAL .5 X3 (DISPOSABLE) IMPLANT
PATTIES SURGICAL .5X1.5 (GAUZE/BANDAGES/DRESSINGS) ×1 IMPLANT
PATTIES SURGICAL .75X.75 (GAUZE/BANDAGES/DRESSINGS) ×1 IMPLANT
PROBE PEDICLE (MISCELLANEOUS) ×1 IMPLANT
PROBE PEDICLE STIM 2.3 TIP F (INSTRUMENTS) ×1 IMPLANT
ROD EXPEDIUM PRE BENT 5.5X75 (Rod) ×2 IMPLANT
SCREW SET SINGLE INNER (Screw) ×6 IMPLANT
SCREW VIPER CORT FIX 6X35 (Screw) ×2 IMPLANT
SCREW VIPER CORTICAL FIX 6X40 (Screw) ×8 IMPLANT
SPONGE INTESTINAL PEANUT (DISPOSABLE) ×1 IMPLANT
SPONGE LAP 4X18 X RAY DECT (DISPOSABLE) ×1 IMPLANT
SPONGE SURGIFOAM ABS GEL 100 (HEMOSTASIS) ×2 IMPLANT
STRIP CLOSURE SKIN 1/2X4 (GAUZE/BANDAGES/DRESSINGS) ×1 IMPLANT
SURGIFLO W/THROMBIN 8M KIT (HEMOSTASIS) IMPLANT
SUT MNCRL AB 4-0 PS2 18 (SUTURE) ×2 IMPLANT
SUT VIC AB 0 CT1 18XCR BRD 8 (SUTURE) ×2 IMPLANT
SUT VIC AB 0 CT1 8-18 (SUTURE) ×2
SUT VIC AB 1 CT1 18XCR BRD 8 (SUTURE) ×2 IMPLANT
SUT VIC AB 1 CT1 8-18 (SUTURE) ×4
SUT VIC AB 2-0 CT2 18 VCP726D (SUTURE) ×4 IMPLANT
SYR 20CC LL (SYRINGE) ×3 IMPLANT
SYR BULB IRRIGATION 50ML (SYRINGE) ×2 IMPLANT
SYR CONTROL 10ML LL (SYRINGE) ×6 IMPLANT
SYR TB 1ML LUER SLIP (SYRINGE) ×2 IMPLANT
TAPE CLOTH SURG 4X10 WHT LF (GAUZE/BANDAGES/DRESSINGS) ×2 IMPLANT
TOWEL OR 17X24 6PK STRL BLUE (TOWEL DISPOSABLE) ×2 IMPLANT
TOWEL OR 17X26 10 PK STRL BLUE (TOWEL DISPOSABLE) ×2 IMPLANT
TRAY FOLEY MTR SLVR 16FR STAT (SET/KITS/TRAYS/PACK) ×2 IMPLANT
WATER STERILE IRR 1000ML POUR (IV SOLUTION) ×2 IMPLANT
YANKAUER SUCT BULB TIP NO VENT (SUCTIONS) ×2 IMPLANT

## 2017-06-29 NOTE — Plan of Care (Signed)
  Problem: Education: Goal: Knowledge of General Education information will improve Outcome: Progressing   Problem: Health Behavior/Discharge Planning: Goal: Ability to manage health-related needs will improve Outcome: Progressing   Problem: Clinical Measurements: Goal: Ability to maintain clinical measurements within normal limits will improve Outcome: Progressing Goal: Will remain free from infection Outcome: Progressing Goal: Diagnostic test results will improve Outcome: Progressing Goal: Respiratory complications will improve Outcome: Progressing Goal: Cardiovascular complication will be avoided Outcome: Progressing   Problem: Activity: Goal: Risk for activity intolerance will decrease Outcome: Progressing   Problem: Nutrition: Goal: Adequate nutrition will be maintained Outcome: Progressing   Problem: Coping: Goal: Level of anxiety will decrease Outcome: Progressing   Problem: Elimination: Goal: Will not experience complications related to bowel motility Outcome: Progressing Goal: Will not experience complications related to urinary retention Outcome: Progressing   Problem: Pain Managment: Goal: General experience of comfort will improve Outcome: Progressing   Problem: Safety: Goal: Ability to remain free from injury will improve Outcome: Progressing   Problem: Skin Integrity: Goal: Risk for impaired skin integrity will decrease Outcome: Progressing   Problem: Activity: Goal: Ability to avoid complications of mobility impairment will improve Outcome: Progressing Goal: Ability to tolerate increased activity will improve Outcome: Progressing Goal: Will remain free from falls Outcome: Progressing   Problem: Bowel/Gastric: Goal: Gastrointestinal status for postoperative course will improve Outcome: Progressing   Problem: Education: Goal: Ability to verbalize activity precautions or restrictions will improve Outcome: Progressing Goal: Knowledge of the  prescribed therapeutic regimen will improve Outcome: Progressing Goal: Understanding of discharge needs will improve Outcome: Progressing   Problem: Physical Regulation: Goal: Ability to maintain clinical measurements within normal limits will improve Outcome: Progressing Goal: Postoperative complications will be avoided or minimized Outcome: Progressing Goal: Diagnostic test results will improve Outcome: Progressing   Problem: Pain Management: Goal: Pain level will decrease Outcome: Progressing   Problem: Skin Integrity: Goal: Signs of wound healing will improve Outcome: Progressing   Problem: Health Behavior/Discharge Planning: Goal: Identification of resources available to assist in meeting health care needs will improve Outcome: Progressing   Problem: Bladder/Genitourinary: Goal: Urinary functional status for postoperative course will improve Outcome: Progressing   

## 2017-06-29 NOTE — Op Note (Signed)
MEDICAL RECORD NO.: [id number]   PHYSICIAN:  Phylliss Bob, MD      DATE OF BIRTH:   [birth year]   DATE OF PROCEDURE:   06/29/2017                               OPERATIVE REPORT     PREOPERATIVE DIAGNOSES: 1. Spinal stenosis L3-4, L4-5 2. Neurogenic claudication 3. L3-4, L4-5 spondylolisthesis   POSTOPERATIVE DIAGNOSES: 1. Spinal stenosis L3-4, L4-5 2. Neurogenic claudication 3. L3-4, L4-5 spondylolisthesis   PROCEDURES: 1. Lumbar decompression, L3-4, L4-5, including bilateral partial facetectomy, and bilateral lumbar decompression 2. Left-sided L3-4, L4-5 transforaminal lumbar interbody fusion. 3. Right-sided L3-4, L4-5 posterolateral fusion. 4. Insertion of interbody device x 2 (Concorde intervertebral spacers). 5. Placement of segmental posterior instrumentation L3, L4, L5, bilaterally. 6. Use of local autograft. 7. Use of morselized allograft - ViviGen. 8. Intraoperative use of fluoroscopy.   SURGEON:  Phylliss Bob, MD.   ASSISTANTPricilla Holm, PA-C.   ANESTHESIA:  General endotracheal anesthesia.   COMPLICATIONS:  None.   DISPOSITION:  Stable.   ESTIMATED BLOOD LOSS:  300cc   INDICATIONS FOR SURGERY:  Briefly, Karla Baker is a pleasant 57 year old Female who did present to me with severe and ongoing pain in the bilateral legs. I did feel that the symptoms were secondary to the findings noted above.   The patient failed conservative care and did wish to proceed with the procedure  noted above.    OPERATIVE DETAILS:  On 06/29/2017, the patient was brought to surgery and general endotracheal anesthesia was administered.  The patient was placed prone on a well-padded flat Jackson bed with a spinal frame.  Antibiotics were given and a time-out procedure was performed. The back was prepped and draped in the usual fashion.  A midline incision was made overlying the L3-4 and L4-5 intervertebral spaces.  The fascia was incised at the midline.  The paraspinal  musculature was bluntly swept laterally.  Anatomic landmarks for the pedicles were exposed. Using fluoroscopy, I did cannulate the L3, L4, and L5 pedicles bilaterally, using a medial to lateral cortical trajectory technique.  On the right side, the posterolateral gutter and facet joints at L3-4 and L4-5 were decorticated and 6 mm screws of the appropriate length were placed at L3, L4, and L5 pedicles and a 75-mm rod was placed  and distraction was applied across the rod at each intervertebral level.  On the left side, the cannulated pedicle holes were filled with bone wax.  I then proceeded with the decompressive aspect of the procedure.     Starting at L4-5, I did perform a laminotomy and a full facetectomy on the left.  I was able to thoroughly and entirely decompress the L4-5 intervertebral space bilaterally, removing facet hypertrophy and ligamentum flavum hypertrophy.  At this point, with an assistant holding medial retraction of the traversing left L5 nerve, I did perform a thorough and complete L4-5 intervertebral discectomy.  The intervertebral space was then liberally packed with autograft from the decompression, as well as allograft in the form of ViviGen, as was the appropriately sized intervertebral spacer (11 mm, lordotic).  Distraction was then released on the contralateral right side.  I then turned my attention to the L3-4 level.  Once again, it was clearly evident that there was stenosis at the L3-4 level, particularly on the left, but also on the right.  The stenosis was thoroughly and adequately decompressed  by performing a bilateral partial facetectomy.  I was able to thoroughly decompress both the left L3 and left L4 nerves. With the assistant holding medial retraction of the traversing left L4 nerve, I did perform an annulotomy at the posterolateral aspect of the L3-4 intervertebral space.  I then used a series of curettes and pituitary rongeurs to perform a thorough and complete  intervertebral diskectomy.  The intervertebral space was then liberally packed with autograft as well as allograft in the form of ViviGen, as was the appropriate-sized intervertebral spacer (11 mm, lordotic).  The spacer was then tamped into position in the usual fashion.  I was very pleased with the press-fit of the spacer.  I then placed 6 mm screws on the left at L3, L4, and L5.  A 75-mm rod was then placed and caps were placed. The distraction was then released on the contralateral right side.  All 6 caps were then locked.  The wound was copiously irrigated with a total of approximately 4 L prior to placing the bone graft.  Additional autograft and allograft were then packed into the posterolateral gutter on the right side to help aid in the success of the fusion.  The wound was  explored for any undue bleeding and there was no substantial bleeding encountered.  Gel-Foam was placed over the laminectomy site.  The wound was then closed in layers using #1 Vicryl followed by 2-0 Vicryl, followed by 4-0 Monocryl.  Benzoin and Steri-Strips were applied followed by sterile dressing.     Of note, I did use triggered EMG to test the screws on the left, and there is no screw the tested below 20 mA. There was no sustained abnormal EMG activity noted throughout the entire surgery.   Of note, Pricilla Holm was my assistant throughout surgery, and did aid in retraction, suctioning, and closure.       Phylliss Bob, MD

## 2017-06-29 NOTE — Anesthesia Preprocedure Evaluation (Addendum)
Anesthesia Evaluation  Patient identified by MRN, date of birth, ID band Patient awake    Reviewed: Allergy & Precautions, NPO status , Patient's Chart, lab work & pertinent test results  History of Anesthesia Complications Negative for: history of anesthetic complications  Airway Mallampati: II  TM Distance: >3 FB Neck ROM: Full    Dental  (+) Dental Advisory Given   Pulmonary sleep apnea and Continuous Positive Airway Pressure Ventilation , COPD, former smoker (quit 2013),    breath sounds clear to auscultation       Cardiovascular hypertension, Pt. on medications (-) angina Rhythm:Regular Rate:Normal  4/19 Stress: normal myocardial thickening and wall motion.  LVEF normal 57%.      Neuro/Psych  Headaches, Depression Chronic back pain: narcotics    GI/Hepatic Neg liver ROS, GERD  Controlled and Medicated,  Endo/Other  diabetes (glu 115), Oral Hypoglycemic AgentsHyperthyroidism   Renal/GU negative Renal ROS     Musculoskeletal   Abdominal (+) + obese,   Peds  Hematology negative hematology ROS (+)   Anesthesia Other Findings   Reproductive/Obstetrics                            Anesthesia Physical Anesthesia Plan  ASA: III  Anesthesia Plan: General   Post-op Pain Management:    Induction: Intravenous  PONV Risk Score and Plan: 4 or greater and Ondansetron, Dexamethasone and Scopolamine patch - Pre-op  Airway Management Planned: Oral ETT  Additional Equipment:   Intra-op Plan:   Post-operative Plan: Extubation in OR  Informed Consent: I have reviewed the patients History and Physical, chart, labs and discussed the procedure including the risks, benefits and alternatives for the proposed anesthesia with the patient or authorized representative who has indicated his/her understanding and acceptance.   Dental advisory given  Plan Discussed with: CRNA and Surgeon  Anesthesia  Plan Comments:         Anesthesia Quick Evaluation

## 2017-06-29 NOTE — Anesthesia Postprocedure Evaluation (Signed)
Anesthesia Post Note  Patient: Ibtisam J Lodico  Procedure(s) Performed: LEFT -SIDED LUMBAR 3-4, LUMBAR 4-5 TRANSFORAMINAL LUMBAR INTERBODY FUSION WITH INSTRUMENTATION AND ALLOGRAFT  TIME REQUESTED 6 HOURS (Left Spine Lumbar)     Patient location during evaluation: PACU Anesthesia Type: General Level of consciousness: awake and alert, oriented and patient cooperative Pain management: pain level controlled Vital Signs Assessment: post-procedure vital signs reviewed and stable Respiratory status: spontaneous breathing, nonlabored ventilation, respiratory function stable and patient connected to nasal cannula oxygen Cardiovascular status: blood pressure returned to baseline and stable Postop Assessment: no apparent nausea or vomiting Anesthetic complications: no    Last Vitals:  Vitals:   06/29/17 1800 06/29/17 1815  BP: 111/73 106/73  Pulse: 93 98  Resp: 19 15  Temp:    SpO2: 94% 94%    Last Pain:  Vitals:   06/29/17 1815  TempSrc:   PainSc: 3                  Markeya Mincy,E. Gredmarie Delange

## 2017-06-29 NOTE — Transfer of Care (Signed)
Immediate Anesthesia Transfer of Care Note  Patient: Karla Baker  Procedure(s) Performed: LEFT -SIDED LUMBAR 3-4, LUMBAR 4-5 TRANSFORAMINAL LUMBAR INTERBODY FUSION WITH INSTRUMENTATION AND ALLOGRAFT  TIME REQUESTED 6 HOURS (Left Spine Lumbar)  Patient Location: PACU  Anesthesia Type:General  Level of Consciousness: awake, oriented and patient cooperative  Airway & Oxygen Therapy: Patient Spontanous Breathing and Patient connected to face mask oxygen  Post-op Assessment: Report given to RN, Post -op Vital signs reviewed and stable and Patient moving all extremities X 4  Post vital signs: Reviewed and stable  Last Vitals:  Vitals Value Taken Time  BP 125/71 06/29/2017  5:28 PM  Temp    Pulse 112 06/29/2017  5:29 PM  Resp 23 06/29/2017  5:29 PM  SpO2 97 % 06/29/2017  5:29 PM  Vitals shown include unvalidated device data.  Last Pain:  Vitals:   06/29/17 0743  TempSrc: Oral  PainSc:       Patients Stated Pain Goal: 2 (85/46/27 0350)  Complications: No apparent anesthesia complications

## 2017-06-29 NOTE — Anesthesia Procedure Notes (Signed)
Procedure Name: Intubation Date/Time: 06/29/2017 10:55 AM Performed by: Lieutenant Diego, CRNA Pre-anesthesia Checklist: Patient identified, Emergency Drugs available, Suction available and Patient being monitored Patient Re-evaluated:Patient Re-evaluated prior to induction Oxygen Delivery Method: Circle system utilized Preoxygenation: Pre-oxygenation with 100% oxygen Induction Type: IV induction Ventilation: Mask ventilation without difficulty Laryngoscope Size: Miller and 2 Grade View: Grade I Tube type: Oral Tube size: 7.0 mm Number of attempts: 1 Airway Equipment and Method: Stylet Placement Confirmation: ETT inserted through vocal cords under direct vision,  positive ETCO2 and breath sounds checked- equal and bilateral Secured at: 22 cm Tube secured with: Tape Dental Injury: Teeth and Oropharynx as per pre-operative assessment

## 2017-06-29 NOTE — H&P (Signed)
PREOPERATIVE H&P  Chief Complaint: Bilateral leg pain  HPI: Karla Baker is a 57 y.o. female who presents with ongoing pain in the bilateral legs  MRI reveals stenosis L3-L5, and xrays reveal instability at same levels  Patient has failed multiple forms of conservative care and continues to have pain (see office notes for additional details regarding the patient's full course of treatment)  Past Medical History:  Diagnosis Date  . Arthritis    "knees and hands" (07/19/2012)  . Boil, back    "popped it 07/08/2012 & it got infected" (07/19/2012)  . Chronic lower back pain   . Depression   . GERD (gastroesophageal reflux disease)   . High cholesterol    "don't think I take the RX anymore" (07/19/2012)  . Hypertension   . Hyperthyroidism    "overactive; never put me on anything for it" (07/19/2012)  . Migraines    "only when I took BCP" (07/19/2012)  . Pneumonia    "a couple times" (07/19/2012)  . Sleep apnea    "suppose to wear mask; I don't" (07/19/2012)  now wears 06-05-17  . Type II diabetes mellitus ([place])    Past Surgical History:  Procedure Laterality Date  . BREAST BIOPSY Left    u/s guided core biopsy  . CESAREAN SECTION  09/22/1980  . COLONOSCOPY    . ESOPHAGOGASTRODUODENOSCOPY    . INCISION AND DRAINAGE ABSCESS N/A 07/20/2012   Procedure: INCISION AND DRAINAGE ABSCESS;  Surgeon: Ralene Ok, MD;  Location: [facility];  Service: General;  Laterality: N/A;  . KNEE ARTHROSCOPY W/ MENISCAL REPAIR Left 2001  . LAPAROSCOPY FOR ECTOPIC PREGNANCY  ~ 1987  . REPLACEMENT TOTAL KNEE Left 2009  . ruptured disc    . TUBAL LIGATION  ~ 1987   Social History   Socioeconomic History  . Marital status: Married    Spouse name: Not on file  . Number of children: Not on file  . Years of education: Not on file  . Highest education level: Not on file  Occupational History  . Not on file  Social Needs  . Financial resource strain: Not on file  . Food insecurity:    Worry:  Not on file    Inability: Not on file  . Transportation needs:    Medical: Not on file    Non-medical: Not on file  Tobacco Use  . Smoking status: Former Smoker    Packs/day: 1.00    Years: 35.00    Pack years: 35.00    Types: Cigarettes    Last attempt to quit: 2013    Years since quitting: 6.3  . Smokeless tobacco: Never Used  Substance and Sexual Activity  . Alcohol use: Yes    Comment: 07/19/2012 "have a drink once in a blue moon; last time was > 6 months ago"  . Drug use: Not Currently    Types: Marijuana    Comment: 07/19/2012 "last weed was a few days ago; usually smoke some once/wk"  . Sexual activity: Yes  Lifestyle  . Physical activity:    Days per week: Not on file    Minutes per session: Not on file  . Stress: Not on file  Relationships  . Social connections:    Talks on phone: Not on file    Gets together: Not on file    Attends religious service: Not on file    Active member of club or organization: Not on file    Attends meetings of  clubs or organizations: Not on file    Relationship status: Not on file  Other Topics Concern  . Not on file  Social History Narrative  . Not on file   Family History  Problem Relation Age of Onset  . Congestive Heart Failure Mother   . Cancer Father        unknown to pt - smoking related but not lung/throat   Allergies  Allergen Reactions  . Latex Rash   Prior to Admission medications   Medication Sig Start Date End Date Taking? Authorizing Provider  acetaminophen (TYLENOL 8 HOUR ARTHRITIS PAIN) 650 MG CR tablet Take 1,300 mg by mouth every 8 (eight) hours as needed for pain.   Yes [provider]  amLODipine (NORVASC) 5 MG tablet Take 2.5 mg by mouth daily.    Yes [provider]  Ascorbic Acid (VITAMIN C) 1000 MG tablet Take 1,000 mg by mouth daily.   Yes [provider]  Cholecalciferol (VITAMIN D3 PO) Take 1 capsule by mouth every evening.    Yes [provider]    hydrochlorothiazide (HYDRODIURIL) 25 MG tablet Take 12.5 mg by mouth daily.   Yes [provider]  metFORMIN (GLUCOPHAGE-XR) 500 MG 24 hr tablet Take 500 mg by mouth 2 (two) times daily.   Yes [provider]  methocarbamol (ROBAXIN) 750 MG tablet Take 750 mg by mouth 4 (four) times daily as needed for muscle spasms.   Yes [provider]  omeprazole (PRILOSEC) 20 MG capsule Take 20 mg by mouth daily before breakfast. 30 minutes prior to first meal of the day   Yes [provider]  oxybutynin (DITROPAN-XL) 10 MG 24 hr tablet Take 10 mg by mouth every evening.   Yes [provider]  simvastatin (ZOCOR) 80 MG tablet Take 40 mg by mouth every evening.   Yes [provider]  traMADol (ULTRAM) 50 MG tablet Take 50-100 mg by mouth every 4 (four) hours as needed for severe pain.   Yes [provider]  diclofenac (VOLTAREN) 75 MG EC tablet Take 75 mg by mouth 2 (two) times daily.    [provider]  oxyCODONE-acetaminophen (PERCOCET/ROXICET) 5-325 MG tablet Take 1 tablet by mouth every 4 (four) hours as needed for severe pain. Patient not taking: Reported on 06/26/2017 10/29/16   Jola Schmidt, MD     All other systems have been reviewed and were otherwise negative with the exception of those mentioned in the HPI and as above.  Physical Exam: Vitals:   06/29/17 0743  BP: (!) 94/57  Pulse: 89  Resp: 18  Temp: 97.8 F (36.6 C)  SpO2: 98%    Body mass index is 38.63 kg/m.  General: Alert, no acute distress Cardiovascular: No pedal edema Respiratory: No cyanosis, no use of accessory musculature Skin: No lesions in the area of chief complaint Neurologic: Sensation intact distally Psychiatric: Patient is competent for consent with normal mood and affect Lymphatic: No axillary or cervical lymphadenopathy   Assessment/Plan: BILATERAL LEG PAIN, SPINAL STENOSIS Plan for Procedure(s): LEFT -SIDED LUMBAR 3-4, LUMBAR 4-5  TRANSFORAMINAL LUMBAR INTERBODY FUSION WITH INSTRUMENTATION AND ALLOGRAFT   Sinclair Ship, MD 06/29/2017 7:50 AM

## 2017-06-30 LAB — GLUCOSE, CAPILLARY: Glucose-Capillary: 137 mg/dL — ABNORMAL HIGH (ref 65–99)

## 2017-06-30 MED FILL — Thrombin For Soln 20000 Unit: CUTANEOUS | Qty: 1 | Status: AC

## 2017-06-30 NOTE — Evaluation (Signed)
Physical Therapy Evaluation Patient Details Name: Karla Baker MRN: [id number] DOB: 13-Dec-1960 Today's Date: 06/30/2017   History of Present Illness  Pt is a 57 yo female s/p L3-5 decompression and fusion on 06/29/17.  Clinical Impression  Pt admitted with above diagnosis. Pt currently with functional limitations due to the deficits listed below (see PT Problem List). At the time of PT eval pt was able to perform transfers and ambulation with supervision for safety to modified independence. Pt reports burning down RLE during gait training however with faster pace, pt states pain is improved by end of ambulation. Pt will benefit from skilled PT to increase their independence and safety with mobility to allow discharge to the venue listed below.       Follow Up Recommendations No PT follow up;Supervision for mobility/OOB    Equipment Recommendations  None recommended by PT    Recommendations for Other Services       Precautions / Restrictions Precautions Precautions: Back Precaution Comments: Handout provided. Reviewed precautions during functional mobility.  Required Braces or Orthoses: Spinal Brace Spinal Brace: Thoracolumbosacral orthotic;Applied in standing position Restrictions Weight Bearing Restrictions: No      Mobility  Bed Mobility Overal bed mobility: Needs Assistance Bed Mobility: Rolling;Sidelying to Sit Rolling: Supervision Sidelying to sit: Supervision       General bed mobility comments: VC's for log roll technique. Pt was able to complete with HOB flat and rails lowered to simulate home environment.   Transfers Overall transfer level: Modified independent Equipment used: None             General transfer comment: VC's for hand placement on seated surface for safety.   Ambulation/Gait Ambulation/Gait assistance: Supervision Ambulation Distance (Feet): 175 Feet Assistive device: None Gait Pattern/deviations: Step-through pattern;Decreased  stride length;Trunk flexed Gait velocity: Decreased Gait velocity interpretation: 1.31 - 2.62 ft/sec, indicative of limited community ambulator General Gait Details: Occasional railing use in hall due to pain in RLE. Noted she needed to do this when moving slowly. When pt was able to increase gait speed, she reports decreased burning sensation in RLE.   Stairs Stairs: Yes Stairs assistance: Min guard Stair Management: One rail Right;Step to pattern;Forwards Number of Stairs: 10 General stair comments: VC's for sequencing and general safety.   Wheelchair Mobility    Modified Rankin (Stroke Patients Only)       Balance Overall balance assessment: Needs assistance Sitting-balance support: Feet supported;No upper extremity supported Sitting balance-Leahy Scale: Fair     Standing balance support: No upper extremity supported;During functional activity Standing balance-Leahy Scale: Fair                               Pertinent Vitals/Pain Pain Assessment: Faces Faces Pain Scale: Hurts even more Pain Location: back; R LE Pain Descriptors / Indicators: Operative site guarding;Burning Pain Intervention(s): Monitored during session    Home Living Family/patient expects to be discharged to:: Private residence Living Arrangements: Spouse/significant other Available Help at Discharge: Family Type of Home: House Home Access: Stairs to enter Entrance Stairs-Rails: Right Entrance Stairs-Number of Steps: 3 Home Layout: Two level Home Equipment: None Additional Comments: reports she might have reacher at home but not sure    Prior Function Level of Independence: Independent with assistive device(s)               Hand Dominance   Dominant Hand: Right    Extremity/Trunk Assessment   Upper Extremity Assessment  Upper Extremity Assessment: Overall WFL for tasks assessed    Lower Extremity Assessment Lower Extremity Assessment: RLE deficits/detail RLE Deficits  / Details: Pt reports weakness and burning down RLE. Consistent with pre-op diagnosis    Cervical / Trunk Assessment Cervical / Trunk Assessment: Other exceptions Cervical / Trunk Exceptions: s/p spinal surg  Communication   Communication: No difficulties  Cognition Arousal/Alertness: Awake/alert Behavior During Therapy: WFL for tasks assessed/performed Overall Cognitive Status: Within Functional Limits for tasks assessed                                        General Comments      Exercises     Assessment/Plan    PT Assessment Patient needs continued PT services  PT Problem List Decreased range of motion;Decreased strength;Decreased activity tolerance;Decreased balance;Decreased mobility;Decreased knowledge of use of DME;Decreased knowledge of precautions;Decreased safety awareness;Pain       PT Treatment Interventions DME instruction;Gait training;Stair training;Therapeutic exercise;Functional mobility training;Therapeutic activities;Neuromuscular re-education;Patient/family education    PT Goals (Current goals can be found in the Care Plan section)  Acute Rehab PT Goals Patient Stated Goal: to go home today by  10 am PT Goal Formulation: With patient Time For Goal Achievement: 07/07/17 Potential to Achieve Goals: Good    Frequency Min 5X/week   Barriers to discharge        Co-evaluation               AM-PAC PT "6 Clicks" Daily Activity  Outcome Measure Difficulty turning over in bed (including adjusting bedclothes, sheets and blankets)?: None Difficulty moving from lying on back to sitting on the side of the bed? : A Little Difficulty sitting down on and standing up from a chair with arms (e.g., wheelchair, bedside commode, etc,.)?: None Help needed moving to and from a bed to chair (including a wheelchair)?: A Little Help needed walking in hospital room?: A Little Help needed climbing 3-5 steps with a railing? : A Little 6 Click Score:  20    End of Session Equipment Utilized During Treatment: Gait belt;Back brace Activity Tolerance: Patient tolerated treatment well Patient left: in chair;with call bell/phone within reach;Other (comment)(MD in room) Nurse Communication: Mobility status PT Visit Diagnosis: Unsteadiness on feet (R26.81);Pain;Other symptoms and signs involving the nervous system (R29.898) Pain - part of body: (back)    Time: 2774-1287 PT Time Calculation (min) (ACUTE ONLY): 23 min   Charges:   PT Evaluation $PT Eval Moderate Complexity: 1 Mod PT Treatments $Gait Training: 8-22 mins   PT G Codes:        Rolinda Roan, PT, DPT Acute Rehabilitation Services Pager: [phone]   Thelma Comp 06/30/2017, 11:04 AM

## 2017-06-30 NOTE — Evaluation (Signed)
Occupational Therapy Evaluation Patient Details Name: EMALEA MIX MRN: [id number] DOB: [birth year] Today's Date: 06/30/2017    History of Present Illness 57 yo female s/p L3-5 decompression and fusion,   Clinical Impression   Patient evaluated by Occupational Therapy with no further acute OT needs identified. All education has been completed and the patient has no further questions. See below for any follow-up Occupational Therapy or equipment needs. OT to sign off. Thank you for referral.      Follow Up Recommendations  No OT follow up    Equipment Recommendations  3 in 1 bedside commode    Recommendations for Other Services       Precautions / Restrictions Precautions Precautions: Back Precaution Comments: reviewed back handout for precautions with adls Required Braces or Orthoses: Spinal Brace Spinal Brace: Thoracolumbosacral orthotic;Applied in standing position Restrictions Weight Bearing Restrictions: No      Mobility Bed Mobility               General bed mobility comments: up in chair on arrival  Transfers Overall transfer level: Modified independent                    Balance                                           ADL either performed or assessed with clinical judgement   ADL Overall ADL's : Modified independent                                       General ADL Comments: pt dressed for home using figure 4 crossing bil LE, demonstrate tub transfer and discussed bed mobility for home. pt repositioned in the chair with education on positioning.    Back handout provided and reviewed adls in detail. Pt educated on: clothing between brace, never sleep in brace, set an alarm at night for medication, avoid sitting for long periods of time, correct bed positioning for sleeping, correct sequence for bed mobility, avoiding lifting more than 5 pounds and never wash directly over incision. All education is  complete and patient indicates understanding.    Vision Baseline Vision/History: No visual deficits       Perception     Praxis      Pertinent Vitals/Pain Pain Assessment: Faces Faces Pain Scale: Hurts a little bit Pain Location: back Pain Descriptors / Indicators: Operative site guarding Pain Intervention(s): Monitored during session;Premedicated before session;Repositioned     Hand Dominance Right   Extremity/Trunk Assessment Upper Extremity Assessment Upper Extremity Assessment: Overall WFL for tasks assessed   Lower Extremity Assessment Lower Extremity Assessment: Defer to PT evaluation   Cervical / Trunk Assessment Cervical / Trunk Assessment: Other exceptions Cervical / Trunk Exceptions: s/p spinal surg   Communication Communication Communication: No difficulties   Cognition Arousal/Alertness: Awake/alert Behavior During Therapy: WFL for tasks assessed/performed Overall Cognitive Status: Within Functional Limits for tasks assessed                                     General Comments       Exercises     Shoulder Instructions      Home Living Family/patient expects to be discharged to::  Private residence Living Arrangements: Spouse/significant other Available Help at Discharge: Family               Bathroom Shower/Tub: Teacher, early years/pre: [place]: None   Additional Comments: reports she might have reacher at home but not sure      Prior Functioning/Environment Level of Independence: Independent with assistive device(s)                 OT Problem List:        OT Treatment/Interventions:      OT Goals(Current goals can be found in the care plan section) Acute Rehab OT Goals Patient Stated Goal: to go home today by  10 am  OT Frequency:     Barriers to D/C:            Co-evaluation              AM-PAC PT "6 Clicks" Daily Activity     Outcome Measure Help from  another person eating meals?: None Help from another person taking care of personal grooming?: None Help from another person toileting, which includes using toliet, bedpan, or urinal?: None Help from another person bathing (including washing, rinsing, drying)?: None Help from another person to put on and taking off regular upper body clothing?: None Help from another person to put on and taking off regular lower body clothing?: None 6 Click Score: 24   End of Session Equipment Utilized During Treatment: Back brace Nurse Communication: Mobility status  Activity Tolerance: Patient tolerated treatment well Patient left: in chair;with call bell/phone within reach  OT Visit Diagnosis: Unsteadiness on feet (R26.81)                Time: 3009-2330 OT Time Calculation (min): 14 min Charges:  OT General Charges $OT Visit: 1 Visit G-Codes:      Jeri Modena   OTR/L Pager: [phone] Office: [phone] .   Parke Poisson B 06/30/2017, 10:48 AM

## 2017-06-30 NOTE — Progress Notes (Signed)
Patient is discharged from room 3C03 at this time. Alert and in stable condition. IV site d/c'd and instructions read to patient and son with understanding verbalized. Left unit via wheelchair with all belongings at side 

## 2017-06-30 NOTE — Progress Notes (Signed)
    Patient doing well  Has been walking with PT Patient reports intermittent right thigh numbness   Physical Exam: Vitals:   06/29/17 2312 06/30/17 0445  BP: 113/74 123/87  Pulse: 92 83  Resp: 18 16  Temp: 97.8 F (36.6 C) 98.6 F (37 C)  SpO2: 98% 97%    Dressing in place NVI  POD #1 s/p L3-5 decompression and fusion, doing well with curious positional right leg numbness  - up with PT/OT, encourage ambulation - Percocet for pain, Valium for muscle spasms - likely d/c home today with f/u in 2 weeks - will rediscuss right thigh numbness at next visit

## 2017-07-05 MED FILL — Sodium Chloride IV Soln 0.9%: INTRAVENOUS | Qty: 1000 | Status: AC

## 2017-07-05 MED FILL — Sodium Chloride Irrigation Soln 0.9%: Qty: 3000 | Status: AC

## 2017-07-05 MED FILL — Heparin Sodium (Porcine) Inj 1000 Unit/ML: INTRAMUSCULAR | Qty: 30 | Status: AC

## 2017-07-06 NOTE — Discharge Summary (Signed)
Patient ID: Karla Baker MRN: [id number] DOB/AGE: [birth year] 59 y.o.  Admit date: 06/29/2017 Discharge date: 06/30/2017  Admission Diagnoses:  Active Problems:   Radiculopathy   Discharge Diagnoses:  Same  Past Medical History:  Diagnosis Date  . Arthritis    "knees and hands" (07/19/2012)  . Boil, back    "popped it 07/08/2012 & it got infected" (07/19/2012)  . Chronic lower back pain   . Depression   . GERD (gastroesophageal reflux disease)   . High cholesterol    "don't think I take the RX anymore" (07/19/2012)  . Hypertension   . Hyperthyroidism    "overactive; never put me on anything for it" (07/19/2012)  . Migraines    "only when I took BCP" (07/19/2012)  . Pneumonia    "a couple times" (07/19/2012)  . Sleep apnea    "suppose to wear mask; I don't" (07/19/2012)  now wears 06-05-17  . Type II diabetes mellitus (HCC)     Surgeries: Procedure(s): LEFT -SIDED LUMBAR 3-4, LUMBAR 4-5 TRANSFORAMINAL LUMBAR INTERBODY FUSION WITH INSTRUMENTATION AND ALLOGRAFT  TIME REQUESTED 6 HOURS on 06/29/2017   Consultants: None  Discharged Condition: Improved  Hospital Course: Karla Baker is an 57 y.o. female who was admitted 06/29/2017 for operative treatment of radiculopathy. Patient has severe unremitting pain that affects sleep, daily activities, and work/hobbies. After pre-op clearance the patient was taken to the operating room on 06/29/2017 and underwent  Procedure(s): LEFT -SIDED LUMBAR 3-4, LUMBAR 4-5 TRANSFORAMINAL LUMBAR INTERBODY FUSION WITH INSTRUMENTATION AND ALLOGRAFT  TIME REQUESTED 6 HOURS.    Patient was given perioperative antibiotics:  Anti-infectives (From admission, onward)   Start     Dose/Rate Route Frequency Ordered Stop   06/29/17 1900  ceFAZolin (ANCEF) IVPB 2g/100 mL premix     2 g 200 mL/hr over 30 Minutes Intravenous Every 8 hours 06/29/17 1854 06/30/17 0243   06/29/17 0700  ceFAZolin (ANCEF) IVPB 2g/100 mL premix     2 g 200 mL/hr over 30  Minutes Intravenous On call to O.R. 06/29/17 6295 06/29/17 1442       Patient was given sequential compression devices, early ambulation to prevent DVT.  Patient benefited maximally from hospital stay and there were no complications.    Recent vital signs: BP 124/79 (BP Location: Left Arm)   Pulse 93   Temp 98.3 F (36.8 C) (Oral)   Resp 16   Ht 5\' 2"  (1.575 m)   Wt 95.8 kg (211 lb 3.2 oz)   SpO2 96%   BMI 38.63 kg/m   Discharge Medications:   Allergies as of 06/30/2017      Reactions   Latex Rash      Medication List    STOP taking these medications   diclofenac 75 MG EC tablet Commonly known as:  VOLTAREN   methocarbamol 750 MG tablet Commonly known as:  ROBAXIN   traMADol 50 MG tablet Commonly known as:  ULTRAM     TAKE these medications   amLODipine 5 MG tablet Commonly known as:  NORVASC Take 2.5 mg by mouth daily.   hydrochlorothiazide 25 MG tablet Commonly known as:  HYDRODIURIL Take 12.5 mg by mouth daily.   metFORMIN 500 MG 24 hr tablet Commonly known as:  GLUCOPHAGE-XR Take 500 mg by mouth 2 (two) times daily.   omeprazole 20 MG capsule Commonly known as:  PRILOSEC Take 20 mg by mouth daily before breakfast. 30 minutes prior to first meal of the day   oxybutynin  10 MG 24 hr tablet Commonly known as:  DITROPAN-XL Take 10 mg by mouth every evening.   oxyCODONE-acetaminophen 5-325 MG tablet Commonly known as:  PERCOCET/ROXICET Take 1 tablet by mouth every 4 (four) hours as needed for severe pain.   simvastatin 80 MG tablet Commonly known as:  ZOCOR Take 40 mg by mouth every evening.   TYLENOL 8 HOUR ARTHRITIS PAIN 650 MG CR tablet Generic drug:  acetaminophen Take 1,300 mg by mouth every 8 (eight) hours as needed for pain.   vitamin C 1000 MG tablet Take 1,000 mg by mouth daily.   VITAMIN D3 PO Take 1 capsule by mouth every evening.       Diagnostic Studies: Dg Lumbar Spine 2-3 Views  Result Date: 06/29/2017 CLINICAL DATA:  L3-4,  4 5 fusion EXAM: LUMBAR SPINE - 2-3 VIEW; DG C-ARM 61-120 MIN COMPARISON:  Intraoperative film from earlier in the same day. FLUOROSCOPY TIME:  Radiation Exposure Index (as provided by the fluoroscopic device): 107.6 mGy If the device does not provide the exposure index: Fluoroscopy Time:  1 minutes 53 seconds Number of Acquired Images:  2 FINDINGS: Pedicle screws are noted at L3, L4 and L5 with interbody fusion at both levels. Posterior fixation is seen. The numbering nomenclature is similar to that utilized on the prior plain film examination. IMPRESSION: Interbody fusion at L3-4 and L4-5. Electronically Signed   By: Inez Catalina M.D.   On: 06/29/2017 17:41   Dg Lumbar Spine 1 View  Result Date: 06/29/2017 CLINICAL DATA:  Lumbar localization EXAM: LUMBAR SPINE - 1 VIEW COMPARISON:  06/27/2016 FINDINGS: Lateral image of the lumbar spine obtained intraoperatively reveals radiopaque needles in the posterior soft tissues at the L1 spinous process as well as inferiorly just above the L4 spinous process. Mild anterolisthesis of L3 on L4 is noted. Disc space narrowing at L4-5 is seen as well. The numbering nomenclature is similar to that used on prior MRI. IMPRESSION: Intraoperative localization as described. Electronically Signed   By: Inez Catalina M.D.   On: 06/29/2017 14:10   Dg C-arm 1-60 Min  Result Date: 06/29/2017 CLINICAL DATA:  L3-4, 4 5 fusion EXAM: LUMBAR SPINE - 2-3 VIEW; DG C-ARM 61-120 MIN COMPARISON:  Intraoperative film from earlier in the same day. FLUOROSCOPY TIME:  Radiation Exposure Index (as provided by the fluoroscopic device): 107.6 mGy If the device does not provide the exposure index: Fluoroscopy Time:  1 minutes 53 seconds Number of Acquired Images:  2 FINDINGS: Pedicle screws are noted at L3, L4 and L5 with interbody fusion at both levels. Posterior fixation is seen. The numbering nomenclature is similar to that utilized on the prior plain film examination. IMPRESSION: Interbody fusion at  L3-4 and L4-5. Electronically Signed   By: Inez Catalina M.D.   On: 06/29/2017 17:41   Dg C-arm 1-60 Min  Result Date: 06/29/2017 CLINICAL DATA:  L3-4, 4 5 fusion EXAM: LUMBAR SPINE - 2-3 VIEW; DG C-ARM 61-120 MIN COMPARISON:  Intraoperative film from earlier in the same day. FLUOROSCOPY TIME:  Radiation Exposure Index (as provided by the fluoroscopic device): 107.6 mGy If the device does not provide the exposure index: Fluoroscopy Time:  1 minutes 53 seconds Number of Acquired Images:  2 FINDINGS: Pedicle screws are noted at L3, L4 and L5 with interbody fusion at both levels. Posterior fixation is seen. The numbering nomenclature is similar to that utilized on the prior plain film examination. IMPRESSION: Interbody fusion at L3-4 and L4-5. Electronically Signed  By: Inez Catalina M.D.   On: 06/29/2017 17:41   Dg C-arm 1-60 Min  Result Date: 06/29/2017 CLINICAL DATA:  L3-4, 4 5 fusion EXAM: LUMBAR SPINE - 2-3 VIEW; DG C-ARM 61-120 MIN COMPARISON:  Intraoperative film from earlier in the same day. FLUOROSCOPY TIME:  Radiation Exposure Index (as provided by the fluoroscopic device): 107.6 mGy If the device does not provide the exposure index: Fluoroscopy Time:  1 minutes 53 seconds Number of Acquired Images:  2 FINDINGS: Pedicle screws are noted at L3, L4 and L5 with interbody fusion at both levels. Posterior fixation is seen. The numbering nomenclature is similar to that utilized on the prior plain film examination. IMPRESSION: Interbody fusion at L3-4 and L4-5. Electronically Signed   By: Inez Catalina M.D.   On: 06/29/2017 17:41    Disposition:    POD #1 s/p L3-5 decompression and fusion, doing well with curious positional right leg numbness  - up with PT/OT, encourage ambulation - Percocet for pain, Valium for muscle spasms -Written scripts for pain signed and in chart -D/C instructions sheet printed and in chart -D/C today  - will rediscuss right thigh numbness at next visit -F/U in office 2  weeks   Signed: Justice Britain 07/06/2017, 7:51 AM

## 2017-07-25 DIAGNOSIS — I739 Peripheral vascular disease, unspecified: Secondary | ICD-10-CM | POA: Diagnosis not present

## 2017-08-02 DIAGNOSIS — I739 Peripheral vascular disease, unspecified: Secondary | ICD-10-CM | POA: Diagnosis not present

## 2017-08-02 DIAGNOSIS — I10 Essential (primary) hypertension: Secondary | ICD-10-CM | POA: Diagnosis not present

## 2017-08-02 DIAGNOSIS — E119 Type 2 diabetes mellitus without complications: Secondary | ICD-10-CM | POA: Diagnosis not present

## 2017-08-02 DIAGNOSIS — R000 Tachycardia, unspecified: Secondary | ICD-10-CM | POA: Diagnosis not present

## 2017-08-04 DIAGNOSIS — R000 Tachycardia, unspecified: Secondary | ICD-10-CM | POA: Diagnosis not present

## 2017-08-21 DIAGNOSIS — Z6838 Body mass index (BMI) 38.0-38.9, adult: Secondary | ICD-10-CM | POA: Diagnosis not present

## 2017-08-21 DIAGNOSIS — E118 Type 2 diabetes mellitus with unspecified complications: Secondary | ICD-10-CM | POA: Diagnosis not present

## 2017-08-21 DIAGNOSIS — M545 Low back pain: Secondary | ICD-10-CM | POA: Diagnosis not present

## 2017-10-12 DIAGNOSIS — F4312 Post-traumatic stress disorder, chronic: Secondary | ICD-10-CM | POA: Diagnosis not present

## 2017-10-30 DIAGNOSIS — I10 Essential (primary) hypertension: Secondary | ICD-10-CM | POA: Diagnosis not present

## 2017-10-30 DIAGNOSIS — M545 Low back pain: Secondary | ICD-10-CM | POA: Diagnosis not present

## 2017-10-30 DIAGNOSIS — E118 Type 2 diabetes mellitus with unspecified complications: Secondary | ICD-10-CM | POA: Diagnosis not present

## 2017-11-01 DIAGNOSIS — I10 Essential (primary) hypertension: Secondary | ICD-10-CM | POA: Diagnosis not present

## 2017-11-01 DIAGNOSIS — I739 Peripheral vascular disease, unspecified: Secondary | ICD-10-CM | POA: Diagnosis not present

## 2017-11-01 DIAGNOSIS — R000 Tachycardia, unspecified: Secondary | ICD-10-CM | POA: Diagnosis not present

## 2017-11-01 DIAGNOSIS — E119 Type 2 diabetes mellitus without complications: Secondary | ICD-10-CM | POA: Diagnosis not present

## 2017-12-05 DIAGNOSIS — F4312 Post-traumatic stress disorder, chronic: Secondary | ICD-10-CM | POA: Diagnosis not present

## 2018-01-15 DIAGNOSIS — I10 Essential (primary) hypertension: Secondary | ICD-10-CM | POA: Diagnosis not present

## 2018-01-15 DIAGNOSIS — N3281 Overactive bladder: Secondary | ICD-10-CM | POA: Diagnosis not present

## 2018-01-15 DIAGNOSIS — Z6838 Body mass index (BMI) 38.0-38.9, adult: Secondary | ICD-10-CM | POA: Diagnosis not present

## 2018-01-15 DIAGNOSIS — E118 Type 2 diabetes mellitus with unspecified complications: Secondary | ICD-10-CM | POA: Diagnosis not present

## 2018-01-29 DIAGNOSIS — E118 Type 2 diabetes mellitus with unspecified complications: Secondary | ICD-10-CM | POA: Diagnosis not present

## 2018-01-29 DIAGNOSIS — I10 Essential (primary) hypertension: Secondary | ICD-10-CM | POA: Diagnosis not present

## 2018-01-29 DIAGNOSIS — Z6837 Body mass index (BMI) 37.0-37.9, adult: Secondary | ICD-10-CM | POA: Diagnosis not present

## 2018-01-31 DIAGNOSIS — F4312 Post-traumatic stress disorder, chronic: Secondary | ICD-10-CM | POA: Diagnosis not present

## 2018-02-06 DIAGNOSIS — I10 Essential (primary) hypertension: Secondary | ICD-10-CM | POA: Diagnosis not present

## 2018-02-06 DIAGNOSIS — E119 Type 2 diabetes mellitus without complications: Secondary | ICD-10-CM | POA: Diagnosis not present

## 2018-02-06 DIAGNOSIS — R000 Tachycardia, unspecified: Secondary | ICD-10-CM | POA: Diagnosis not present

## 2018-02-06 DIAGNOSIS — Z0189 Encounter for other specified special examinations: Secondary | ICD-10-CM | POA: Diagnosis not present

## 2018-03-13 DIAGNOSIS — F4312 Post-traumatic stress disorder, chronic: Secondary | ICD-10-CM | POA: Diagnosis not present

## 2018-03-20 DIAGNOSIS — R000 Tachycardia, unspecified: Secondary | ICD-10-CM | POA: Diagnosis not present

## 2018-03-20 DIAGNOSIS — Z87891 Personal history of nicotine dependence: Secondary | ICD-10-CM | POA: Diagnosis not present

## 2018-03-20 DIAGNOSIS — I10 Essential (primary) hypertension: Secondary | ICD-10-CM | POA: Diagnosis not present

## 2018-03-20 DIAGNOSIS — E119 Type 2 diabetes mellitus without complications: Secondary | ICD-10-CM | POA: Diagnosis not present

## 2018-04-24 ENCOUNTER — Other Ambulatory Visit: Payer: Self-pay

## 2018-04-24 MED ORDER — METOPROLOL SUCCINATE ER 25 MG PO TB24: 25.0000 mg | ORAL_TABLET | Freq: Every day | ORAL | 1 refills | Status: DC

## 2018-05-04 ENCOUNTER — Other Ambulatory Visit: Payer: Self-pay | Admitting: Cardiology

## 2018-05-08 DIAGNOSIS — F4312 Post-traumatic stress disorder, chronic: Secondary | ICD-10-CM | POA: Diagnosis not present

## 2018-05-15 NOTE — Progress Notes (Deleted)
Subjective:   Karla Baker, female    DOB: 04-04-1960, 58 y.o.   MRN: [id number]  Iona Beard, MD:  No chief complaint on file.   HPI: Karla Baker  is a 58 y.o. female  with controlled type 2 diabetes, hypertension, hyperlipidemia, sleep apnea on CPAP, former tobacco use with approximate 40 pack year history, and hyperthyroidism treated with iodine radiation therapy at [facility]. She has sinus tachycardia that was confirmed by 24 hour holter monitor. Had low risk nuclear stress test and normal echocardiogram.  Patient is here on 2 month follow up for hypertension. Metoprolol was increased at her last office visit both for hypertension and continued tachycardia.   Underwent spinal fusion in May 2019 without any CV complications. She now has only mild back pain. Has claudication symptoms, and underwent lower extremity duplex June 2019 with decreased ABI bilateral; however, had normal waveforms. Reports claudication symptoms have been stable.   Past Medical History:  Diagnosis Date  . Arthritis    "knees and hands" (07/19/2012)  . Boil, back    "popped it 07/08/2012 & it got infected" (07/19/2012)  . Chronic lower back pain   . Depression   . GERD (gastroesophageal reflux disease)   . High cholesterol    "don't think I take the RX anymore" (07/19/2012)  . Hypertension   . Hyperthyroidism    "overactive; never put me on anything for it" (07/19/2012)  . Migraines    "only when I took BCP" (07/19/2012)  . Pneumonia    "a couple times" (07/19/2012)  . Sleep apnea    "suppose to wear mask; I don't" (07/19/2012)  now wears 06-05-17  . Type II diabetes mellitus ([place])     Past Surgical History:  Procedure Laterality Date  . BREAST BIOPSY Left    u/s guided core biopsy  . CESAREAN SECTION  09/22/1980  . COLONOSCOPY    . ESOPHAGOGASTRODUODENOSCOPY    . INCISION AND DRAINAGE ABSCESS N/A 07/20/2012   Procedure: INCISION AND DRAINAGE ABSCESS;  Surgeon: Ralene Ok,  MD;  Location: [facility];  Service: General;  Laterality: N/A;  . KNEE ARTHROSCOPY W/ MENISCAL REPAIR Left 2001  . LAPAROSCOPY FOR ECTOPIC PREGNANCY  ~ 1987  . REPLACEMENT TOTAL KNEE Left 2009  . ruptured disc    . TUBAL LIGATION  ~ 1987    Family History  Problem Relation Age of Onset  . Congestive Heart Failure Mother   . Cancer Father        unknown to pt - smoking related but not lung/throat    Social History   Socioeconomic History  . Marital status: Married    Spouse name: Not on file  . Number of children: Not on file  . Years of education: Not on file  . Highest education level: Not on file  Occupational History  . Not on file  Social Needs  . Financial resource strain: Not on file  . Food insecurity:    Worry: Not on file    Inability: Not on file  . Transportation needs:    Medical: Not on file    Non-medical: Not on file  Tobacco Use  . Smoking status: Former Smoker    Packs/day: 1.00    Years: 35.00    Pack years: 35.00    Types: Cigarettes    Last attempt to quit: 2013    Years since quitting: 7.2  . Smokeless tobacco: Never Used  Substance and Sexual Activity  .  Alcohol use: Yes    Comment: 07/19/2012 "have a drink once in a blue moon; last time was > 6 months ago"  . Drug use: Not Currently    Types: Marijuana    Comment: 07/19/2012 "last weed was a few days ago; usually smoke some once/wk"  . Sexual activity: Yes  Lifestyle  . Physical activity:    Days per week: Not on file    Minutes per session: Not on file  . Stress: Not on file  Relationships  . Social connections:    Talks on phone: Not on file    Gets together: Not on file    Attends religious service: Not on file    Active member of club or organization: Not on file    Attends meetings of clubs or organizations: Not on file    Relationship status: Not on file  . Intimate partner violence:    Fear of current or ex partner: Not on file    Emotionally abused: Not on file    Physically  abused: Not on file    Forced sexual activity: Not on file  Other Topics Concern  . Not on file  Social History Narrative  . Not on file    No outpatient medications have been marked as taking for the 05/16/18 encounter (Appointment) with Miquel Dunn, NP.     Review of Systems  Constitution: Negative for decreased appetite, malaise/fatigue, weight gain and weight loss.  Eyes: Negative for visual disturbance.  Cardiovascular: Positive for dyspnea on exertion. Negative for chest pain, claudication, leg swelling, orthopnea, palpitations and syncope.  Respiratory: Positive for snoring (history of sleep apnea). Negative for hemoptysis and wheezing.   Endocrine: Negative for cold intolerance and heat intolerance.  Hematologic/Lymphatic: Does not bruise/bleed easily.  Skin: Negative for nail changes.  Musculoskeletal: Positive for back pain and joint pain. Negative for muscle weakness and myalgias.  Gastrointestinal: Negative for abdominal pain, change in bowel habit, nausea and vomiting.  Neurological: Negative for difficulty with concentration, dizziness, focal weakness and headaches.  Psychiatric/Behavioral: Negative for altered mental status and suicidal ideas.  All other systems reviewed and are negative.      Objective:     There were no vitals taken for this visit.  Cardiac studies:  EKG 02/06/2018: Sinus tachycardia at 110 bpm, normal axis, early R-wave progression, no evidence of ischemia.  Echo- 06/07/2017 1. Left ventricle cavity is normal in size. Mild concentric hypertrophy of the left ventricle. Normal global wall motion. Visual EF is 55-60%. Doppler evidence of grade I (impaired) diastolic dysfunction. 2. Trace mitral regurgitation. 3. Mild tricuspid regurgitation. No evidence of pulmonary hypertension  Lexiscan myoview stress test 06/12/2017:  1. Lexiscan stress test was performed. Exercise capacity not assessed. No stress symptoms reported. Normal  blood pressure. Stress EKG is non diagnostic for ischemia as it is a pharmacologic stress.  2. The overall quality of the study is excellent. There is no evidence of abnormal lung activity. Stress and rest SPECT images demonstrate homogeneous tracer distribution throughout the myocardium. Gated SPECT imaging reveals normal myocardial thickening and wall motion. The left ventricular ejection fraction was normal (57%).   3. Low risk study.  24 Hr Holter monitor 08/02/2017: Dominat rhythm sinus tachycardia. Avg HR 114 bpm. Range 81-154 bpm.  Occasional PAC/PVC. No other arrhtymia seen. No symptoms reported.  Lower extremity arterial duplex 07/25/2017: No hemodynamically significant stenoses are identified in the lower extremity arterial system.  This exam reveals mildly decreased perfusion of the  lower extremity, noted at the dorsalis pedis and post tibial artery level (bilateral ABI 0.80). Although ABI reduced, there is triphasic waveform to the flow (normal).  Recent Labs: Labs 08/04/2017: H/H 14/41.  MCV 93.  Platelets 401  Labs 12/21/2016: Glucose 183BUN/Cr14/0.9. eGFR >60. Na/K 142/? Chol 164, TG 299, HDL 44, cLDL 60  Physical Exam  Constitutional: She is oriented to person, place, and time. Vital signs are normal. She appears well-developed and well-nourished.  HENT:  Head: Normocephalic and atraumatic.  Neck: Normal range of motion.  Cardiovascular: Regular rhythm, normal heart sounds and intact distal pulses. Tachycardia present.  Pulmonary/Chest: Effort normal and breath sounds normal. No accessory muscle usage. No respiratory distress.  Abdominal: Soft. Bowel sounds are normal.  Musculoskeletal: Normal range of motion.  Neurological: She is alert and oriented to person, place, and time.  Skin: Skin is warm and dry.  Vitals reviewed.          Assessment & Recommendations:  There are no diagnoses linked to this encounter.   Plan:   Jeri Lager, MSN, APRN, FNP-C  [facility], [place] Office: [phone] Fax: [phone]

## 2018-05-16 ENCOUNTER — Other Ambulatory Visit: Payer: Self-pay

## 2018-05-16 ENCOUNTER — Encounter: Payer: Self-pay | Admitting: Cardiology

## 2018-05-16 ENCOUNTER — Ambulatory Visit: Payer: Self-pay | Admitting: Cardiology

## 2018-05-16 ENCOUNTER — Telehealth (INDEPENDENT_AMBULATORY_CARE_PROVIDER_SITE_OTHER): Payer: Medicare Other | Admitting: Cardiology

## 2018-05-16 VITALS — BP 123/83 | HR 89 | Ht 62.0 in | Wt 205.0 lb

## 2018-05-16 DIAGNOSIS — E782 Mixed hyperlipidemia: Secondary | ICD-10-CM

## 2018-05-16 DIAGNOSIS — I10 Essential (primary) hypertension: Secondary | ICD-10-CM

## 2018-05-16 DIAGNOSIS — R002 Palpitations: Secondary | ICD-10-CM | POA: Insufficient documentation

## 2018-05-16 HISTORY — DX: Palpitations: R00.2

## 2018-05-16 HISTORY — DX: Essential (primary) hypertension: I10

## 2018-06-07 NOTE — Progress Notes (Signed)
Subjective:  Primary Physician/Referring:  Iona Beard, MD  Patient ID: Karla Baker, female    DOB: Dec 19, 1960, 58 y.o.   MRN: [id number]  No chief complaint on file.   HPI: Karla Baker  is a 58 y.o. female  with controlled type 2 diabetes, moderate obesity,  hypertension, hyperlipidemia, sleep apnea on CPAP, former tobacco use with approximate 40 pack year history, and hyperthyroidism treated with iodine radiation therapy at [facility].  She has sinus tachycardia that was confirmed by 24 hour holter monitor. Had low risk nuclear stress test a in nd normal echocardiogram in Apr 2019.  Patient has continued to have tachycardia. She has completed her physical therapy and now has mild back pain and has gone through back surgery for sciatica in May 2019.  Lower extremity duplex June 2019 with very mild decreased ABI bilateral; however, had normal waveforms. Reports claudication symptoms have been stable.  She does not exercise, but is hoping to start soon. She is tolerating metoprolol and symptoms of palpitations have improved.      Past Medical History:  Diagnosis Date   Arthritis    "knees and hands" (07/19/2012)   Boil, back    "popped it 07/08/2012 & it got infected" (07/19/2012)   Chronic lower back pain    Depression    Essential hypertension 05/16/2018   GERD (gastroesophageal reflux disease)    High cholesterol    "don't think I take the RX anymore" (07/19/2012)   Hypertension    Hyperthyroidism    "overactive; never put me on anything for it" (07/19/2012)   Migraines    "only when I took BCP" (07/19/2012)   Palpitations 05/16/2018   Pneumonia    "a couple times" (07/19/2012)   Sleep apnea    "suppose to wear mask; I don't" (07/19/2012)  now wears 06-05-17   Type II diabetes mellitus ([place])     Past Surgical History:  Procedure Laterality Date   BREAST BIOPSY Left    u/s guided core biopsy   CESAREAN SECTION  09/22/1980   COLONOSCOPY      ESOPHAGOGASTRODUODENOSCOPY     INCISION AND DRAINAGE ABSCESS N/A 07/20/2012   Procedure: INCISION AND DRAINAGE ABSCESS;  Surgeon: Ralene Ok, MD;  Location: [place] [facility];  Service: General;  Laterality: N/A;   KNEE ARTHROSCOPY W/ MENISCAL REPAIR Left 2001   LAPAROSCOPY FOR ECTOPIC PREGNANCY  ~ [place] Left 2009   ruptured disc     TUBAL LIGATION  ~ 1987    Social History   Socioeconomic History   Marital status: Married    Spouse name: Not on file   Number of children: Not on file   Years of education: Not on file   Highest education level: Not on file  Occupational History   Not on file  Social Needs   Financial resource strain: Not on file   Food insecurity:    Worry: Not on file    Inability: Not on file   Transportation needs:    Medical: Not on file    Non-medical: Not on file  Tobacco Use   Smoking status: Former Smoker    Packs/day: 1.00    Years: 35.00    Pack years: 35.00    Types: Cigarettes    Last attempt to quit: 2013    Years since quitting: 7.3   Smokeless tobacco: Never Used  Substance and Sexual Activity   Alcohol use: Yes    Comment: 07/19/2012 "  have a drink once in a blue moon; last time was > 6 months ago"   Drug use: Not Currently    Types: Marijuana    Comment: 07/19/2012 "last weed was a few days ago; usually smoke some once/wk"   Sexual activity: Yes  Lifestyle   Physical activity:    Days per week: Not on file    Minutes per session: Not on file   Stress: Not on file  Relationships   Social connections:    Talks on phone: Not on file    Gets together: Not on file    Attends religious service: Not on file    Active member of club or organization: Not on file    Attends meetings of clubs or organizations: Not on file    Relationship status: Not on file   Intimate partner violence:    Fear of current or ex partner: Not on file    Emotionally abused: Not on file    Physically abused: Not on file     Forced sexual activity: Not on file  Other Topics Concern   Not on file  Social History Narrative   Not on file    Current Outpatient Medications on File Prior to Visit  Medication Sig Dispense Refill   acetaminophen (TYLENOL 8 HOUR ARTHRITIS PAIN) 650 MG CR tablet Take 1,300 mg by mouth every 8 (eight) hours as needed for pain.     Ascorbic Acid (VITAMIN C) 1000 MG tablet Take 1,000 mg by mouth daily.     Biotin 10000 MCG TABS Take by mouth daily.     buPROPion (WELLBUTRIN XL) 150 MG 24 hr tablet Take by mouth daily.     cetirizine (ZYRTEC) 10 MG tablet Take by mouth daily.     Cholecalciferol (VITAMIN D3 PO) Take 1 capsule by mouth every evening.      diclofenac (VOLTAREN) 75 MG EC tablet 2 (two) times daily.     fluocinonide ointment (LIDEX) 0.05 % as needed.     ketoconazole (NIZORAL) 2 % cream as needed.     metFORMIN (GLUCOPHAGE-XR) 500 MG 24 hr tablet Take 500 mg by mouth 2 (two) times daily.     methocarbamol (ROBAXIN) 750 MG tablet as needed.     omeprazole (PRILOSEC) 20 MG capsule Take 20 mg by mouth daily before breakfast. 30 minutes prior to first meal of the day     oxybutynin (DITROPAN-XL) 10 MG 24 hr tablet Take 10 mg by mouth every evening.     prazosin (MINIPRESS) 1 MG capsule Take by mouth at bedtime.     simvastatin (ZOCOR) 80 MG tablet Take 40 mg by mouth every evening.     traMADol (ULTRAM) 50 MG tablet Take 1 tablet by mouth as needed.     vitamin B-12 (CYANOCOBALAMIN) 500 MCG tablet Take 500 mcg by mouth daily.     hydrochlorothiazide (HYDRODIURIL) 25 MG tablet Take 12.5 mg by mouth daily.     No current facility-administered medications on file prior to visit.     Review of Systems  Constitution: Negative for chills, decreased appetite, malaise/fatigue and weight gain.  Cardiovascular: Positive for dyspnea on exertion and palpitations. Negative for leg swelling and syncope.  Endocrine: Negative for cold intolerance.   Hematologic/Lymphatic: Does not bruise/bleed easily.  Musculoskeletal: Positive for back pain and joint pain. Negative for joint swelling.  Gastrointestinal: Negative for abdominal pain, anorexia and change in bowel habit.  Neurological: Negative for headaches, light-headedness and paresthesias.  Psychiatric/Behavioral: Negative  for depression and substance abuse.  All other systems reviewed and are negative.     Objective:  Blood pressure 123/83, pulse 89, height 5' 2" (1.575 m), weight 205 lb (93 kg). Body mass index is 37.49 kg/m.  Physical exam not performed or limited due to virtual visit. Please see exam details from prior visit is as below.   Physical Exam  Constitutional: She appears well-developed. No distress.  Morbidly obese  HENT:  Head: Atraumatic.  Eyes: Conjunctivae are normal.  Neck: Neck supple. No thyromegaly present.  Short neck and difficult to evaluate JVP  Cardiovascular: Normal rate, regular rhythm and normal heart sounds. Exam reveals no gallop.  No murmur heard. Pulses:      Carotid pulses are 2+ on the right side and 2+ on the left side.      Dorsalis pedis pulses are 2+ on the right side and 2+ on the left side.       Posterior tibial pulses are 2+ on the right side and 2+ on the left side.  Femoral and popliteal pulse difficult to feel due to patient's body habitus.   Pulmonary/Chest: Effort normal and breath sounds normal.  Abdominal: Soft. Bowel sounds are normal.  Obese. Pannus present  Musculoskeletal: Normal range of motion.        General: No edema.  Neurological: She is alert.  Skin: Skin is warm and dry.  Psychiatric: She has a normal mood and affect.    Radiology: No results found.  Laboratory Examination: Labs 08/04/2017: H/H 14/41. MCV 93. Platelets 401  Labs 12/21/2016: Glucose 183BUN/Cr14/0.9. eGFR >60. Na 142.  Chol 164, TG 299, HDL 44, cLDL 60  CMP Latest Ref Rng & Units 06/29/2017 06/05/2017 07/26/2012  Glucose 65 - 99 mg/dL  115(H) 141(H) 149(H)  BUN 6 - 20 mg/dL 13 14 24(H)  Creatinine 0.44 - 1.00 mg/dL 0.81 0.93 2.04(H)  Sodium 135 - 145 mmol/L 143 139 135  Potassium 3.5 - 5.1 mmol/L 3.9 3.8 4.0  Chloride 101 - 111 mmol/L 106 104 97  CO2 22 - 32 mmol/L 26 20(L) 18(L)  Calcium 8.9 - 10.3 mg/dL 9.1 9.9 10.8(H)  Total Protein 6.5 - 8.1 g/dL 6.9 8.2(H) 8.2  Total Bilirubin 0.3 - 1.2 mg/dL 0.7 0.6 0.1(L)  Alkaline Phos 38 - 126 U/L 65 90 84  AST 15 - 41 U/L _0 ALT 14 - 54 U/L _1 CBC Latest Ref Rng & Units 06/29/2017 06/05/2017 07/26/2012  WBC 4.0 - 10.5 K/uL 11.3(H) 10.2 12.5(H)  Hemoglobin 12.0 - 15.0 g/dL 12.9 14.4 15.0  Hematocrit 36.0 - 46.0 % 39.1 43.0 41.9  Platelets 150 - 400 K/uL 462(H) 492(H) 596(H)  HEMOGLOBIN A1C Lab Results  Component Value Date   HGBA1C 7.1 (H) 06/05/2017   MPG 157.07 06/05/2017   TSH No results for input(s): TSH in the last 8760 hours.   Cardiac studies:    Echo 06/08/2017:  1. Left ventricle cavity is normal in size. Mild concentric hypertrophy of the left ventricle. Normal global wall motion. Visual EF is 55-60%. Doppler evidence of grade I (impaired) diastolic dysfunction. 2. Trace mitral regurgitation. 3. Mild tricuspid regurgitation. No evidence of pulmonary hypertension  24 Hr Holter monitor 08/02/2017: Dominat rhythm sinus tachycardia. Avg HR 114 bpm. Range 81-154 bpm. Occasional PAC/PVC. No other arrhtymia seen.  No symptoms reported.  Lexiscan myoview stress test 06/12/2017: 1. Lexiscan stress test was performed. Exercise capacity not assessed. No stress symptoms reported. Normal blood pressure.  Stress EKG is non diagnostic for ischemia as it is a pharmacologic stress. 2. The overall quality of the study is excellent. There is no evidence of abnormal lung activity. Stress and rest SPECT images demonstrate homogeneous tracer distribution throughout the myocardium. Gated SPECT imaging reveals normal myocardial thickening and wall motion. The left  ventricular ejection fraction was normal (57%).   3. Low risk study.  Lower extremity arterial duplex 07/25/2017: No hemodynamically significant stenoses are identified in the lower extremity arterial system.  This exam reveals mildly decreased perfusion of the  lower extremity, noted at the dorsalis pedis and post tibial artery level (bilateral ABI 0.80). Although ABI reduced, there is triphasic waveform to the flow (normal).   Assessment:    Palpitations - Plan: metoprolol succinate (TOPROL-XL) 25 MG 24 hr tablet  Essential hypertension  Mixed hyperlipidemia  EKG 02/06/2018: Sinus tachycardia at 110 bpm, normal axis, early R-wave progression, no evidence of ischemia.  Recommendations:    This is a two-month office visit and follow-up of hypertension, palpitations and hyperlipidemia.  Since increasing the beta blocker dose, symptoms have improved with regard to palpitations and blood pressure well controlled.  She plans to increase her physical activity.  She is now tolerating metoprolol succinate 25 mg daily without side effects.  Lipids are being managed by her PCP she is on maximum dose of simvastatin without any side effects, triglycerides should improve with weight loss and dietary modification.  In view of normal echocardiogram and a stress test in April 2019 and also underwent back surgery following which she did not have any cardiac complications, no arrhythmias noted, also Holter monitor in June 2019 revealing sinus rhythm, I will see her back on a p.r.n. basis.  She is certainly at risk for atrial fibrillation.    Adrian Prows, MD, [facility] 06/20/2018, 7:34 AM [facility] [place] Pager: [phone] Office: [phone] If no answer Cell [phone]

## 2018-06-20 MED ORDER — METOPROLOL SUCCINATE ER 25 MG PO TB24
25.0000 mg | ORAL_TABLET | Freq: Every day | ORAL | 1 refills | Status: DC
Start: 2018-06-20 — End: 2018-10-31

## 2018-07-09 DIAGNOSIS — F4312 Post-traumatic stress disorder, chronic: Secondary | ICD-10-CM | POA: Diagnosis not present

## 2018-09-10 DIAGNOSIS — F4312 Post-traumatic stress disorder, chronic: Secondary | ICD-10-CM | POA: Diagnosis not present

## 2018-10-31 ENCOUNTER — Other Ambulatory Visit: Payer: Self-pay

## 2018-10-31 DIAGNOSIS — R002 Palpitations: Secondary | ICD-10-CM

## 2018-10-31 MED ORDER — METOPROLOL SUCCINATE ER 25 MG PO TB24: 25.0000 mg | ORAL_TABLET | Freq: Every day | ORAL | 11 refills | Status: DC

## 2018-11-12 ENCOUNTER — Other Ambulatory Visit: Payer: Self-pay

## 2018-11-12 DIAGNOSIS — R002 Palpitations: Secondary | ICD-10-CM

## 2018-11-12 DIAGNOSIS — F4312 Post-traumatic stress disorder, chronic: Secondary | ICD-10-CM | POA: Diagnosis not present

## 2018-11-12 MED ORDER — METOPROLOL SUCCINATE ER 25 MG PO TB24: 25.0000 mg | ORAL_TABLET | Freq: Every day | ORAL | 11 refills | Status: DC

## 2019-01-07 DIAGNOSIS — F4312 Post-traumatic stress disorder, chronic: Secondary | ICD-10-CM | POA: Diagnosis not present

## 2019-03-25 DIAGNOSIS — F4312 Post-traumatic stress disorder, chronic: Secondary | ICD-10-CM | POA: Diagnosis not present

## 2019-05-11 ENCOUNTER — Other Ambulatory Visit: Payer: Self-pay | Admitting: Cardiology

## 2019-05-11 DIAGNOSIS — R002 Palpitations: Secondary | ICD-10-CM

## 2019-05-15 DIAGNOSIS — F4312 Post-traumatic stress disorder, chronic: Secondary | ICD-10-CM | POA: Diagnosis not present

## 2019-06-19 DIAGNOSIS — F4312 Post-traumatic stress disorder, chronic: Secondary | ICD-10-CM | POA: Diagnosis not present

## 2019-07-24 DIAGNOSIS — F4312 Post-traumatic stress disorder, chronic: Secondary | ICD-10-CM | POA: Diagnosis not present

## 2019-08-14 ENCOUNTER — Emergency Department (HOSPITAL_COMMUNITY)
Admission: EM | Admit: 2019-08-14 | Discharge: 2019-08-15 | Disposition: A | Discharge status: Home / Self Care | Payer: Medicare Other | Attending: Emergency Medicine | Admitting: Emergency Medicine

## 2019-08-14 ENCOUNTER — Other Ambulatory Visit: Payer: Self-pay

## 2019-08-14 ENCOUNTER — Encounter (HOSPITAL_COMMUNITY): Payer: Self-pay

## 2019-08-14 DIAGNOSIS — R42 Dizziness and giddiness: Secondary | ICD-10-CM

## 2019-08-14 DIAGNOSIS — I10 Essential (primary) hypertension: Secondary | ICD-10-CM | POA: Diagnosis not present

## 2019-08-14 DIAGNOSIS — N390 Urinary tract infection, site not specified: Secondary | ICD-10-CM | POA: Insufficient documentation

## 2019-08-14 DIAGNOSIS — F17210 Nicotine dependence, cigarettes, uncomplicated: Secondary | ICD-10-CM | POA: Diagnosis not present

## 2019-08-14 DIAGNOSIS — Z79899 Other long term (current) drug therapy: Secondary | ICD-10-CM | POA: Diagnosis not present

## 2019-08-14 LAB — CBG MONITORING, ED: Glucose-Capillary: 127 mg/dL — ABNORMAL HIGH (ref 70–99)

## 2019-08-14 MED ORDER — SODIUM CHLORIDE 0.9% FLUSH: 3.0000 mL | Freq: Once | INTRAVENOUS | Status: DC

## 2019-08-14 NOTE — ED Triage Notes (Signed)
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

## 2019-08-15 ENCOUNTER — Other Ambulatory Visit: Payer: Self-pay

## 2019-08-15 DIAGNOSIS — N390 Urinary tract infection, site not specified: Secondary | ICD-10-CM | POA: Diagnosis not present

## 2019-08-15 LAB — CBC
HCT: 40.2 % (ref 36.0–46.0)
Hemoglobin: 13.2 g/dL (ref 12.0–15.0)
MCH: 30.9 pg (ref 26.0–34.0)
MCHC: 32.8 g/dL (ref 30.0–36.0)
MCV: 94.1 fL (ref 80.0–100.0)
Platelets: 403 10*3/uL — ABNORMAL HIGH (ref 150–400)
RBC: 4.27 MIL/uL (ref 3.87–5.11)
RDW: 15 % (ref 11.5–15.5)
WBC: 9 10*3/uL (ref 4.0–10.5)
nRBC: 0 % (ref 0.0–0.2)

## 2019-08-15 LAB — URINALYSIS, ROUTINE W REFLEX MICROSCOPIC
Bilirubin Urine: NEGATIVE
Glucose, UA: NEGATIVE mg/dL
Hgb urine dipstick: NEGATIVE
Ketones, ur: NEGATIVE mg/dL
Nitrite: POSITIVE — AB
Protein, ur: NEGATIVE mg/dL
Specific Gravity, Urine: 1.019 (ref 1.005–1.030)
WBC, UA: >50 WBC/hpf — ABNORMAL HIGH (ref 0–5)
pH: 5 (ref 5.0–8.0)

## 2019-08-15 LAB — BASIC METABOLIC PANEL
Anion gap: 12 (ref 5–15)
BUN: 20 mg/dL (ref 8–23)
CO2: 25 mmol/L (ref 22–32)
Calcium: 9.3 mg/dL (ref 8.9–10.3)
Chloride: 105 mmol/L (ref 98–111)
Creatinine, Ser: 0.97 mg/dL (ref 0.44–1.00)
GFR calc Af Amer: >60 mL/min (ref >60)
GFR calc non Af Amer: >60 mL/min (ref >60)
Glucose, Bld: 131 mg/dL — ABNORMAL HIGH (ref 70–99)
Potassium: 4.3 mmol/L (ref 3.5–5.1)
Sodium: 142 mmol/L (ref 135–145)

## 2019-08-15 MED ORDER — CEPHALEXIN 500 MG PO CAPS
500.0000 mg | ORAL_CAPSULE | Freq: Four times a day (QID) | ORAL | 0 refills | Status: DC
Start: 2019-08-15 — End: 2021-03-16

## 2019-08-15 MED ORDER — MECLIZINE HCL 25 MG PO TABS
25.0000 mg | ORAL_TABLET | Freq: Once | ORAL | Status: AC
  Administered 2019-08-15: 25 mg via ORAL
  Filled 2019-08-15: qty 1

## 2019-08-15 MED ORDER — SODIUM CHLORIDE 0.9 % IV SOLN
1.0000 g | Freq: Once | INTRAVENOUS | Status: AC
  Administered 2019-08-15: 1 g via INTRAVENOUS
  Filled 2019-08-15: qty 10

## 2019-08-15 MED ORDER — LACTATED RINGERS IV BOLUS
1000.0000 mL | Freq: Once | INTRAVENOUS | Status: AC
  Administered 2019-08-15: 1000 mL via INTRAVENOUS

## 2019-08-15 NOTE — ED Provider Notes (Signed)
[place] [facility] DEPT Provider Note   CSN: [id number] Arrival date & time: 08/14/19  2113     History Chief Complaint  Patient presents with  . Dizziness    Karla Baker is a 59 y.o. female.   Dizziness Quality:  Lightheadedness Severity:  Moderate Onset quality:  Gradual Duration:  2 days Timing:  Intermittent Chronicity:  New Context: bending over, head movement and standing up   Relieved by:  Being still Worsened by:  Standing up Ineffective treatments:  None tried Associated symptoms: no blood in stool, no nausea, no tinnitus, no vision changes, no vomiting and no weakness        Past Medical History:  Diagnosis Date  . Arthritis    "knees and hands" (07/19/2012)  . Boil, back    "popped it 07/08/2012 & it got infected" (07/19/2012)  . Chronic lower back pain   . Depression   . Essential hypertension 05/16/2018  . GERD (gastroesophageal reflux disease)   . High cholesterol    "don't think I take the RX anymore" (07/19/2012)  . Hypertension   . Hyperthyroidism    "overactive; never put me on anything for it" (07/19/2012)  . Migraines    "only when I took BCP" (07/19/2012)  . Palpitations 05/16/2018  . Pneumonia    "a couple times" (07/19/2012)  . Sleep apnea    "suppose to wear mask; I don't" (07/19/2012)  now wears 06-05-17  . Type II diabetes mellitus [facility])     Patient Active Problem List   Diagnosis Date Noted  . Palpitations 05/16/2018  . Essential hypertension 05/16/2018  . Radiculopathy 06/29/2017  . MRSA (methicillin resistant Staphylococcus aureus) 07/23/2012  . Abscess of back 07/19/2012    Past Surgical History:  Procedure Laterality Date  . BREAST BIOPSY Left    u/s guided core biopsy  . CESAREAN SECTION  09/22/1980  . COLONOSCOPY    . ESOPHAGOGASTRODUODENOSCOPY    . INCISION AND DRAINAGE ABSCESS N/A 07/20/2012   Procedure: INCISION AND DRAINAGE ABSCESS;  Surgeon: Ralene Ok, MD;  Location: [facility];   Service: General;  Laterality: N/A;  . KNEE ARTHROSCOPY W/ MENISCAL REPAIR Left 2001  . LAPAROSCOPY FOR ECTOPIC PREGNANCY  ~ 1987  . REPLACEMENT TOTAL KNEE Left 2009  . ruptured disc    . TUBAL LIGATION  ~ 1987     OB History   No obstetric history on file.     Family History  Problem Relation Age of Onset  . Congestive Heart Failure Mother   . Cancer Father        unknown to pt - smoking related but not lung/throat    Social History   Tobacco Use  . Smoking status: Former Smoker    Packs/day: 1.00    Years: 35.00    Pack years: 35.00    Types: Cigarettes    Quit date: 2013    Years since quitting: 8.4  . Smokeless tobacco: Never Used  Vaping Use  . Vaping Use: Never used  Substance Use Topics  . Alcohol use: Yes    Comment: 07/19/2012 "have a drink once in a blue moon; last time was > 6 months ago"  . Drug use: Not Currently    Types: Marijuana    Comment: 07/19/2012 "last weed was a few days ago; usually smoke some once/wk"    Home Medications Prior to Admission medications   Medication Sig Start Date End Date Taking? Authorizing Provider  acetaminophen (TYLENOL 8 HOUR ARTHRITIS  PAIN) 650 MG CR tablet Take 1,300 mg by mouth every 8 (eight) hours as needed for pain.   Yes [provider]  Ascorbic Acid (VITAMIN C) 1000 MG tablet Take 1,000 mg by mouth daily.   Yes [provider]  Biotin 10000 MCG TABS Take 1 tablet by mouth daily.    Yes [provider]  buPROPion (WELLBUTRIN XL) 150 MG 24 hr tablet Take 150 mg by mouth daily.    Yes [provider]  Cholecalciferol (VITAMIN D3 PO) Take 1 capsule by mouth every evening.    Yes [provider]  diclofenac (VOLTAREN) 75 MG EC tablet Take 75 mg by mouth 2 (two) times daily.  04/03/18  Yes [provider]  fluocinonide ointment (LIDEX) 3.41 % Apply 1 application topically as needed (dermatosis).  05/02/18  Yes [provider]  ketoconazole (NIZORAL) 2 % cream  Apply 1 application topically as needed for irritation.  05/02/18  Yes [provider]  metFORMIN (GLUCOPHAGE-XR) 500 MG 24 hr tablet Take 500 mg by mouth 2 (two) times daily.   Yes [provider]  methocarbamol (ROBAXIN) 750 MG tablet Take 750 mg by mouth as needed for muscle spasms.  12/29/17  Yes [provider]  metoprolol succinate (TOPROL-XL) 50 MG 24 hr tablet Take 50 mg by mouth daily. 08/05/19  Yes [provider]  omega-3 acid ethyl esters (LOVAZA) 1 g capsule Take 1 g by mouth 2 (two) times daily.   Yes [provider]  omeprazole (PRILOSEC) 20 MG capsule Take 20 mg by mouth daily before breakfast. 30 minutes prior to first meal of the day   Yes [provider]  oxybutynin (DITROPAN-XL) 10 MG 24 hr tablet Take 10 mg by mouth every evening.   Yes [provider]  prazosin (MINIPRESS) 1 MG capsule Take 1 mg by mouth at bedtime.    Yes [provider]  simvastatin (ZOCOR) 80 MG tablet Take 40 mg by mouth every evening.   Yes [provider]  SYNTHROID 50 MCG tablet Take 50 mcg by mouth daily. 07/24/19  Yes [provider]  traMADol (ULTRAM) 50 MG tablet Take 1 tablet by mouth every 12 (twelve) hours as needed for moderate pain.    Yes [provider]  vitamin B-12 (CYANOCOBALAMIN) 500 MCG tablet Take 500 mcg by mouth daily.   Yes [provider]  cephALEXin (KEFLEX) 500 MG capsule Take 1 capsule (500 mg total) by mouth 4 (four) times daily. 08/15/19   Lavera Vandermeer, Corene Cornea, MD    Allergies    Latex  Review of Systems   Review of Systems  HENT: Negative for tinnitus.   Gastrointestinal: Negative for blood in stool, nausea and vomiting.  Neurological: Positive for dizziness. Negative for weakness.  All other systems reviewed and are negative.   Physical Exam Updated Vital Signs BP 112/63   Pulse 87   Temp 97.6 F (36.4 C) (Oral)   Resp 20   Ht 5\' 2"  (1.575 m)   Wt 97.1 kg   SpO2  100%   BMI 39.14 kg/m   Physical Exam Vitals and nursing note reviewed.  Constitutional:      Appearance: She is well-developed.  HENT:     Head: Normocephalic and atraumatic.  Eyes:     Pupils: Pupils are equal, round, and reactive to light.  Cardiovascular:     Rate and Rhythm: Normal rate and regular rhythm.  Pulmonary:     Effort: No respiratory distress.  Breath sounds: No stridor.  Abdominal:     General: There is no distension.  Musculoskeletal:        General: No swelling or tenderness. Normal range of motion.     Cervical back: Normal range of motion.  Skin:    General: Skin is warm and dry.  Neurological:     General: No focal deficit present.     Mental Status: She is alert.     Comments: No altered mental status, able to give full seemingly accurate history.  Face is symmetric, EOM's intact, pupils equal and reactive, vision intact, tongue and uvula midline without deviation. Upper and Lower extremity motor 5/5, intact pain perception in distal extremities, 2+ reflexes in biceps, patella and achilles tendons. Able to perform finger to nose normal with both hands.     ED Results / Procedures / Treatments   Labs (all labs ordered are listed, but only abnormal results are displayed) Labs Reviewed  BASIC METABOLIC PANEL - Abnormal; Notable for the following components:      Result Value   Glucose, Bld 131 (*)    All other components within normal limits  CBC - Abnormal; Notable for the following components:   Platelets 403 (*)    All other components within normal limits  URINALYSIS, ROUTINE W REFLEX MICROSCOPIC - Abnormal; Notable for the following components:   APPearance HAZY (*)    Nitrite POSITIVE (*)    Leukocytes,Ua MODERATE (*)    WBC, UA >50 (*)    Bacteria, UA MANY (*)    All other components within normal limits  CBG MONITORING, ED - Abnormal; Notable for the following components:   Glucose-Capillary 127 (*)    All other components within  normal limits  URINE CULTURE    EKG None  Radiology No results found.  Procedures Procedures (including critical care time)  Medications Ordered in ED Medications  sodium chloride flush (NS) 0.9 % injection 3 mL (has no administration in time range)  lactated ringers bolus 1,000 mL (0 mLs Intravenous Stopped 08/15/19 0225)  meclizine (ANTIVERT) tablet 25 mg (25 mg Oral Given 08/15/19 0042)  cefTRIAXone (ROCEPHIN) 1 g in sodium chloride 0.9 % 100 mL IVPB (0 g Intravenous Stopped 08/15/19 0201)    ED Course  I have reviewed the triage vital signs and the nursing notes.  Pertinent labs & imaging results that were available during my care of the patient were reviewed by me and considered in my medical decision making (see chart for details).    MDM Rules/Calculators/A&P                          I suspect her symptoms were likely from dehydration related to the urinary tract infection. She does have the schwannoma but she didn't really describe it as vertiginous even though it was related to movement is mostly just standing up and feeling really lightheaded and "woozy ". She felt much improved here with the meclizine, fluids and antibiotics. We'll treat her for urinary tract infection with PCP follow-up if not improving.  Final Clinical Impression(s) / ED Diagnoses Final diagnoses:  Dizziness  Urinary tract infection without hematuria, site unspecified    Rx / DC Orders ED Discharge Orders         Ordered    cephALEXin (KEFLEX) 500 MG capsule  4 times daily     Discontinue  Reprint     08/15/19 8119  Savvy Peeters, Corene Cornea, MD 08/15/19 [phone]

## 2019-08-17 LAB — URINE CULTURE: Culture: >=100000 — AB

## 2019-08-18 ENCOUNTER — Telehealth: Payer: Self-pay | Admitting: Emergency Medicine

## 2019-08-18 NOTE — Telephone Encounter (Signed)
Post ED Visit - Positive Culture Follow-up  Culture report reviewed by antimicrobial stewardship pharmacist: [place] Team []  Elenor Quinones, Pharm.D. []  Heide Guile, Pharm.D., BCPS AQ-ID []  Parks Neptune, Pharm.D., BCPS []  Alycia Rossetti, Pharm.D., BCPS []  Thayer, [place].D., BCPS, AAHIVP []  Legrand Como, Pharm.D., BCPS, AAHIVP []  Salome Arnt, PharmD, BCPS []  Johnnette Gourd, PharmD, BCPS []  Hughes Better, PharmD, BCPS []  Leeroy Cha, PharmD []  Laqueta Linden, PharmD, BCPS []  Albertina Parr, PharmD  [facility] [facility] Team []  Leodis Sias, PharmD []  Lindell Spar, PharmD []  Royetta Asal, PharmD []  Graylin Shiver, Rph [x]  Rema Fendt) Glennon Mac, PharmD []  Arlyn Dunning, PharmD []  Netta Cedars, PharmD []  Dia Sitter, PharmD []  Leone Haven, PharmD []  Gretta Arab, PharmD []  Theodis Shove, PharmD []  Peggyann Juba, PharmD []  Reuel Boom, PharmD   Positive urine culture Treated with Cephalexin, organism sensitive to the same and no further patient follow-up is required at this time.  Sandi Raveling Wilson Sample 08/18/2019, 1:10 PM

## 2019-09-23 DIAGNOSIS — F4312 Post-traumatic stress disorder, chronic: Secondary | ICD-10-CM | POA: Diagnosis not present

## 2019-10-10 DIAGNOSIS — H905 Unspecified sensorineural hearing loss: Secondary | ICD-10-CM | POA: Diagnosis not present

## 2019-10-10 DIAGNOSIS — R2689 Other abnormalities of gait and mobility: Secondary | ICD-10-CM | POA: Diagnosis not present

## 2019-10-10 DIAGNOSIS — D333 Benign neoplasm of cranial nerves: Secondary | ICD-10-CM | POA: Diagnosis not present

## 2019-10-29 DIAGNOSIS — F4312 Post-traumatic stress disorder, chronic: Secondary | ICD-10-CM | POA: Diagnosis not present

## 2019-12-30 DIAGNOSIS — F4312 Post-traumatic stress disorder, chronic: Secondary | ICD-10-CM | POA: Diagnosis not present

## 2020-03-02 DIAGNOSIS — F4312 Post-traumatic stress disorder, chronic: Secondary | ICD-10-CM | POA: Diagnosis not present

## 2020-05-04 DIAGNOSIS — F4312 Post-traumatic stress disorder, chronic: Secondary | ICD-10-CM | POA: Diagnosis not present

## 2020-06-24 DIAGNOSIS — F4312 Post-traumatic stress disorder, chronic: Secondary | ICD-10-CM | POA: Diagnosis not present

## 2020-07-14 ENCOUNTER — Emergency Department (HOSPITAL_COMMUNITY)
Admission: EM | Admit: 2020-07-14 | Discharge: 2020-07-14 | Disposition: A | Discharge status: Home / Self Care | Payer: Medicare Other | Attending: Emergency Medicine | Admitting: Emergency Medicine

## 2020-07-14 ENCOUNTER — Encounter (HOSPITAL_COMMUNITY): Payer: Self-pay | Admitting: *Deleted

## 2020-07-14 DIAGNOSIS — R0689 Other abnormalities of breathing: Secondary | ICD-10-CM | POA: Diagnosis not present

## 2020-07-14 DIAGNOSIS — I10 Essential (primary) hypertension: Secondary | ICD-10-CM | POA: Insufficient documentation

## 2020-07-14 DIAGNOSIS — I959 Hypotension, unspecified: Secondary | ICD-10-CM | POA: Diagnosis not present

## 2020-07-14 DIAGNOSIS — Z87891 Personal history of nicotine dependence: Secondary | ICD-10-CM | POA: Insufficient documentation

## 2020-07-14 DIAGNOSIS — Z7984 Long term (current) use of oral hypoglycemic drugs: Secondary | ICD-10-CM | POA: Insufficient documentation

## 2020-07-14 DIAGNOSIS — Z91040 Latex allergy status: Secondary | ICD-10-CM | POA: Diagnosis not present

## 2020-07-14 DIAGNOSIS — Z79899 Other long term (current) drug therapy: Secondary | ICD-10-CM | POA: Insufficient documentation

## 2020-07-14 DIAGNOSIS — Z96652 Presence of left artificial knee joint: Secondary | ICD-10-CM | POA: Diagnosis not present

## 2020-07-14 DIAGNOSIS — R112 Nausea with vomiting, unspecified: Secondary | ICD-10-CM | POA: Insufficient documentation

## 2020-07-14 DIAGNOSIS — E119 Type 2 diabetes mellitus without complications: Secondary | ICD-10-CM | POA: Insufficient documentation

## 2020-07-14 DIAGNOSIS — R000 Tachycardia, unspecified: Secondary | ICD-10-CM | POA: Diagnosis not present

## 2020-07-14 DIAGNOSIS — R110 Nausea: Secondary | ICD-10-CM | POA: Diagnosis not present

## 2020-07-14 LAB — COMPREHENSIVE METABOLIC PANEL
ALT: 20 U/L (ref 0–44)
AST: 17 U/L (ref 15–41)
Albumin: 4.2 g/dL (ref 3.5–5.0)
Alkaline Phosphatase: 82 U/L (ref 38–126)
Anion gap: 13 (ref 5–15)
BUN: 15 mg/dL (ref 8–23)
CO2: 18 mmol/L — ABNORMAL LOW (ref 22–32)
Calcium: 9.7 mg/dL (ref 8.9–10.3)
Chloride: 109 mmol/L (ref 98–111)
Creatinine, Ser: 0.75 mg/dL (ref 0.44–1.00)
GFR, Estimated: >60 mL/min (ref >60)
Glucose, Bld: 144 mg/dL — ABNORMAL HIGH (ref 70–99)
Potassium: 3.6 mmol/L (ref 3.5–5.1)
Sodium: 140 mmol/L (ref 135–145)
Total Bilirubin: 0.6 mg/dL (ref 0.3–1.2)
Total Protein: 7.6 g/dL (ref 6.5–8.1)

## 2020-07-14 LAB — CBC
HCT: 45.5 % (ref 36.0–46.0)
Hemoglobin: 15.4 g/dL — ABNORMAL HIGH (ref 12.0–15.0)
MCH: 32 pg (ref 26.0–34.0)
MCHC: 33.8 g/dL (ref 30.0–36.0)
MCV: 94.4 fL (ref 80.0–100.0)
Platelets: 400 10*3/uL (ref 150–400)
RBC: 4.82 MIL/uL (ref 3.87–5.11)
RDW: 15 % (ref 11.5–15.5)
WBC: 17.8 10*3/uL — ABNORMAL HIGH (ref 4.0–10.5)
nRBC: 0 % (ref 0.0–0.2)

## 2020-07-14 LAB — LIPASE, BLOOD: Lipase: 24 U/L (ref 11–51)

## 2020-07-14 MED ORDER — FAMOTIDINE IN NACL 20-0.9 MG/50ML-% IV SOLN
20.0000 mg | Freq: Once | INTRAVENOUS | Status: AC
  Administered 2020-07-14: 20 mg via INTRAVENOUS
  Filled 2020-07-14: qty 50

## 2020-07-14 MED ORDER — SODIUM CHLORIDE 0.9 % IV BOLUS
1000.0000 mL | Freq: Once | INTRAVENOUS | Status: AC
  Administered 2020-07-14: 1000 mL via INTRAVENOUS

## 2020-07-14 MED ORDER — ONDANSETRON 8 MG PO TBDP
8.0000 mg | ORAL_TABLET | Freq: Three times a day (TID) | ORAL | 0 refills | Status: DC | PRN
Start: 2020-07-14 — End: 2022-05-12

## 2020-07-14 MED ORDER — ONDANSETRON 4 MG PO TBDP: 4.0000 mg | ORAL_TABLET | Freq: Once | ORAL | Status: DC

## 2020-07-14 MED ORDER — ONDANSETRON HCL 4 MG/2ML IJ SOLN
4.0000 mg | Freq: Once | INTRAMUSCULAR | Status: AC
  Administered 2020-07-14: 4 mg via INTRAVENOUS
  Filled 2020-07-14: qty 2

## 2020-07-14 NOTE — Discharge Instructions (Signed)
We signed the ER for nausea and vomiting. Are glad that your symptoms are better.  Take Zofran as needed for symptom management of your nausea.  We do not think this is an allergic reaction, however if you start developing rash, have shortness of breath, abdominal pain, vomiting or diarrhea get worse -please return to the ER immediately.

## 2020-07-14 NOTE — ED Triage Notes (Addendum)
Per EMS, pt had onset of n/v while getting nails done today at 10AM. Reports having loose bowel movements when arriving home. No abdominal. Pt given 4 mg zofran IV en route.   BP 130/73 HR 90 CBG 201

## 2020-07-14 NOTE — ED Provider Notes (Signed)
[facility] DEPT Provider Note   CSN: [id number] Arrival date & time: 07/14/20  1248     History Chief Complaint  Patient presents with  . Emesis  . Nausea    Karla Baker is a 60 y.o. female.  HPI    60 year old female comes in w/ chief complaint of nausea and vomiting. Patient reports that she was getting her nails done, when she had noxious response to acetone in which she had to immerse her nails.  She had to leave immediately and threw up at the facility.  She left without getting her nails done, and and while at home she continued to have nausea and vomiting.  She threw up for about 20 minutes and decided to call EMS.  Patient denies any chest pain, shortness of breath, difficulty in breathing, rash, itching.  No known history of allergies.  Past Medical History:  Diagnosis Date  . Arthritis    "knees and hands" (07/19/2012)  . Boil, back    "popped it 07/08/2012 & it got infected" (07/19/2012)  . Chronic lower back pain   . Depression   . Essential hypertension 05/16/2018  . GERD (gastroesophageal reflux disease)   . High cholesterol    "don't think I take the RX anymore" (07/19/2012)  . Hypertension   . Hyperthyroidism    "overactive; never put me on anything for it" (07/19/2012)  . Migraines    "only when I took BCP" (07/19/2012)  . Palpitations 05/16/2018  . Pneumonia    "a couple times" (07/19/2012)  . Sleep apnea    "suppose to wear mask; I don't" (07/19/2012)  now wears 06-05-17  . Type II diabetes mellitus [facility])     Patient Active Problem List   Diagnosis Date Noted  . Palpitations 05/16/2018  . Essential hypertension 05/16/2018  . Radiculopathy 06/29/2017  . MRSA (methicillin resistant Staphylococcus aureus) 07/23/2012  . Abscess of back 07/19/2012    Past Surgical History:  Procedure Laterality Date  . BREAST BIOPSY Left    u/s guided core biopsy  . CESAREAN SECTION  09/22/1980  . COLONOSCOPY    .  ESOPHAGOGASTRODUODENOSCOPY    . INCISION AND DRAINAGE ABSCESS N/A 07/20/2012   Procedure: INCISION AND DRAINAGE ABSCESS;  Surgeon: Ralene Ok, MD;  Location: [place];  Service: General;  Laterality: N/A;  . KNEE ARTHROSCOPY W/ MENISCAL REPAIR Left 2001  . LAPAROSCOPY FOR ECTOPIC PREGNANCY  ~ 1987  . REPLACEMENT TOTAL KNEE Left 2009  . ruptured disc    . TUBAL LIGATION  ~ 1987     OB History   No obstetric history on file.     Family History  Problem Relation Age of Onset  . Congestive Heart Failure Mother   . Cancer Father        unknown to pt - smoking related but not lung/throat    Social History   Tobacco Use  . Smoking status: Former Smoker    Packs/day: 1.00    Years: 35.00    Pack years: 35.00    Types: Cigarettes    Quit date: 2013    Years since quitting: 9.3  . Smokeless tobacco: Never Used  Vaping Use  . Vaping Use: Never used  Substance Use Topics  . Alcohol use: Yes    Comment: 07/19/2012 "have a drink once in a blue moon; last time was > 6 months ago"  . Drug use: Not Currently    Types: Marijuana    Comment: 07/19/2012 "last  weed was a few days ago; usually smoke some once/wk"    Home Medications Prior to Admission medications   Medication Sig Start Date End Date Taking? Authorizing Provider  ondansetron (ZOFRAN ODT) 8 MG disintegrating tablet Take 1 tablet (8 mg total) by mouth every 8 (eight) hours as needed for nausea. 07/14/20  Yes Varney Biles, MD  acetaminophen (TYLENOL 8 HOUR ARTHRITIS PAIN) 650 MG CR tablet Take 1,300 mg by mouth every 8 (eight) hours as needed for pain.    [provider]  Ascorbic Acid (VITAMIN C) 1000 MG tablet Take 1,000 mg by mouth daily.    [provider]  Biotin 10000 MCG TABS Take 1 tablet by mouth daily.     [provider]  buPROPion (WELLBUTRIN XL) 150 MG 24 hr tablet Take 150 mg by mouth daily.     [provider]  cephALEXin (KEFLEX) 500 MG capsule Take 1 capsule (500 mg  total) by mouth 4 (four) times daily. 08/15/19   Mesner, Corene Cornea, MD  Cholecalciferol (VITAMIN D3 PO) Take 1 capsule by mouth every evening.     [provider]  diclofenac (VOLTAREN) 75 MG EC tablet Take 75 mg by mouth 2 (two) times daily.  04/03/18   [provider]  fluocinonide ointment (LIDEX) 5.68 % Apply 1 application topically as needed (dermatosis).  05/02/18   [provider]  ketoconazole (NIZORAL) 2 % cream Apply 1 application topically as needed for irritation.  05/02/18   [provider]  metFORMIN (GLUCOPHAGE-XR) 500 MG 24 hr tablet Take 500 mg by mouth 2 (two) times daily.    [provider]  methocarbamol (ROBAXIN) 750 MG tablet Take 750 mg by mouth as needed for muscle spasms.  12/29/17   [provider]  metoprolol succinate (TOPROL-XL) 50 MG 24 hr tablet Take 50 mg by mouth daily. 08/05/19   [provider]  omega-3 acid ethyl esters (LOVAZA) 1 g capsule Take 1 g by mouth 2 (two) times daily.    [provider]  omeprazole (PRILOSEC) 20 MG capsule Take 20 mg by mouth daily before breakfast. 30 minutes prior to first meal of the day    [provider]  oxybutynin (DITROPAN-XL) 10 MG 24 hr tablet Take 10 mg by mouth every evening.    [provider]  prazosin (MINIPRESS) 1 MG capsule Take 1 mg by mouth at bedtime.     [provider]  simvastatin (ZOCOR) 80 MG tablet Take 40 mg by mouth every evening.    [provider]  SYNTHROID 50 MCG tablet Take 50 mcg by mouth daily. 07/24/19   [provider]  traMADol (ULTRAM) 50 MG tablet Take 1 tablet by mouth every 12 (twelve) hours as needed for moderate pain.     [provider]  vitamin B-12 (CYANOCOBALAMIN) 500 MCG tablet Take 500 mcg by mouth daily.    [provider]    Allergies    Latex  Review of Systems   Review of Systems  Respiratory: Negative for shortness of breath.   Cardiovascular: Negative  for chest pain.  Gastrointestinal: Positive for nausea and vomiting.  Skin: Negative for rash.  All other systems reviewed and are negative.   Physical Exam Updated Vital Signs BP (!) 144/85   Pulse 86   Temp (!) 97.5 F (36.4 C) (Oral)   Resp 18   SpO2 95%   Physical Exam Vitals and nursing note reviewed.  Constitutional:  Appearance: She is well-developed.  HENT:     Head: Normocephalic and atraumatic.  Cardiovascular:     Rate and Rhythm: Normal rate.  Pulmonary:     Effort: Pulmonary effort is normal.  Abdominal:     General: Bowel sounds are normal.  Musculoskeletal:     Cervical back: Normal range of motion and neck supple.  Skin:    General: Skin is warm and dry.  Neurological:     Mental Status: She is alert and oriented to person, place, and time.     ED Results / Procedures / Treatments   Labs (all labs ordered are listed, but only abnormal results are displayed) Labs Reviewed  COMPREHENSIVE METABOLIC PANEL - Abnormal; Notable for the following components:      Result Value   CO2 18 (*)    Glucose, Bld 144 (*)    All other components within normal limits  CBC - Abnormal; Notable for the following components:   WBC 17.8 (*)    Hemoglobin 15.4 (*)    All other components within normal limits  LIPASE, BLOOD  URINALYSIS, ROUTINE W REFLEX MICROSCOPIC    EKG EKG Interpretation  Date/Time:  Tuesday Jul 14 2020 14:57:13 EDT Ventricular Rate:  84 PR Interval:  154 QRS Duration: 84 QT Interval:  360 QTC Calculation: 426 R Axis:   63 Text Interpretation: Sinus rhythm No acute changes No significant change since last tracing Confirmed by Varney Biles [phone]) on 07/14/2020 3:27:39 PM   Radiology No results found.  Procedures Procedures   Medications Ordered in ED Medications  ondansetron (ZOFRAN-ODT) disintegrating tablet 4 mg (4 mg Oral Not Given 07/14/20 1415)  ondansetron (ZOFRAN) injection 4 mg (4 mg Intravenous Given 07/14/20 1414)   famotidine (PEPCID) IVPB 20 mg premix (0 mg Intravenous Stopped 07/14/20 1459)  sodium chloride 0.9 % bolus 1,000 mL (1,000 mLs Intravenous New Bag/Given 07/14/20 1415)    ED Course  I have reviewed the triage vital signs and the nursing notes.  Pertinent labs & imaging results that were available during my care of the patient were reviewed by me and considered in my medical decision making (see chart for details).    MDM Rules/Calculators/A&P                          60 year old female comes in a chief complaint of sudden onset nausea and vomiting.  Symptoms started after she was exposed to noxious smell from reagent in which she had to immerse her nails.  She has not had this type of response to it before. She denies any URI-like symptoms, wheezing, rash.  No difficulty in swallowing or breathing.  We will give her some IV fluid, Zofran and reassess.  IV Pepcid ordered.  Reassessment: Patient feels a lot better.  She wants to go home now.  Her nausea and vomiting has stopped after IV Zofran and Pepcid. Strict ER return precautions have been discussed, and patient is agreeing with the plan and is comfortable with the workup done and the recommendations from the ER.   Final Clinical Impression(s) / ED Diagnoses Final diagnoses:  Non-intractable vomiting with nausea, unspecified vomiting type    Rx / DC Orders ED Discharge Orders         Ordered    ondansetron (ZOFRAN ODT) 8 MG disintegrating tablet  Every 8 hours PRN        07/14/20 1541  Varney Biles, MD 07/14/20 [phone]

## 2020-07-14 NOTE — ED Provider Notes (Signed)
Emergency Medicine Provider Triage Evaluation Note  Karla Baker , a 60 y.o. female  was evaluated in triage.  Pt complains of acute onset of nausea, vomiting while getting her nails done just prior to arrival.  States that she felt like the fumes have gotten into her throat and caused her to feel nauseous and threw up.  Denies any trouble breathing, chest pain, abdominal pain or changes to bowel movements.  No symptoms prior to getting her nails done at the salon.  Review of Systems  Positive: Nausea, vomiting Negative: Diarrhea, abdominal pain or chest pain  Physical Exam  BP (!) 155/94 (BP Location: Left Arm)   Pulse (!) 104   Temp (!) 97.5 F (36.4 C) (Oral)   Resp 18   SpO2 95%  Gen:   Awake, no distress   Resp:  Normal effort  MSK:   Moves extremities without difficulty  Other:  Tachycardic  Medical Decision Making  Medically screening exam initiated at 1:08 PM.  Appropriate orders placed.  Karla Baker was informed that the remainder of the evaluation will be completed by another provider, this initial triage assessment does not replace that evaluation, and the importance of remaining in the ED until their evaluation is complete.  Will order antiemetic while awaiting lab work   Delia Heady, Hershal Coria 07/14/20 [place], Nathan, MD 07/14/20 [phone]

## 2020-08-26 DIAGNOSIS — F4312 Post-traumatic stress disorder, chronic: Secondary | ICD-10-CM | POA: Diagnosis not present

## 2020-09-28 DIAGNOSIS — D333 Benign neoplasm of cranial nerves: Secondary | ICD-10-CM | POA: Diagnosis not present

## 2020-10-06 DIAGNOSIS — E1169 Type 2 diabetes mellitus with other specified complication: Secondary | ICD-10-CM | POA: Diagnosis not present

## 2020-10-06 DIAGNOSIS — E039 Hypothyroidism, unspecified: Secondary | ICD-10-CM | POA: Diagnosis not present

## 2020-10-06 DIAGNOSIS — R634 Abnormal weight loss: Secondary | ICD-10-CM | POA: Diagnosis not present

## 2020-10-06 DIAGNOSIS — I10 Essential (primary) hypertension: Secondary | ICD-10-CM | POA: Diagnosis not present

## 2020-10-19 DIAGNOSIS — F4312 Post-traumatic stress disorder, chronic: Secondary | ICD-10-CM | POA: Diagnosis not present

## 2020-10-30 ENCOUNTER — Other Ambulatory Visit: Payer: Self-pay | Admitting: Family Medicine

## 2020-10-30 DIAGNOSIS — F17200 Nicotine dependence, unspecified, uncomplicated: Secondary | ICD-10-CM

## 2020-11-17 ENCOUNTER — Other Ambulatory Visit: Payer: Self-pay

## 2020-11-17 ENCOUNTER — Ambulatory Visit
Admission: RE | Admit: 2020-11-17 | Discharge: 2020-11-17 | Disposition: A | Discharge status: Home / Self Care | Payer: Medicare Other | Source: Ambulatory Visit | Attending: Family Medicine | Admitting: Family Medicine

## 2020-11-17 DIAGNOSIS — Z87891 Personal history of nicotine dependence: Secondary | ICD-10-CM | POA: Diagnosis not present

## 2020-11-17 DIAGNOSIS — F17200 Nicotine dependence, unspecified, uncomplicated: Secondary | ICD-10-CM

## 2020-11-17 IMAGING — CT CT CHEST LUNG CANCER SCREENING LOW DOSE W/O CM
1 of 3 series · 9 of 40 positions shown, 12 images · non-contrast
Comparison: No priors.

CLINICAL DATA: 60-year-old female former smoker (quit 9 years ago)
with 35 pack-year history of smoking. Lung cancer screening
examination.

EXAM:
CT CHEST WITHOUT CONTRAST LOW-DOSE FOR LUNG CANCER SCREENING
TECHNIQUE: Multidetector CT imaging of the chest was performed following the
standard protocol without IV contrast.

[ct lung segmentation data · axial · 0.75mm/px · z∈[-344,-344]mm · 9 of 320 frames shown]
[frame 1/320  mediastinal]
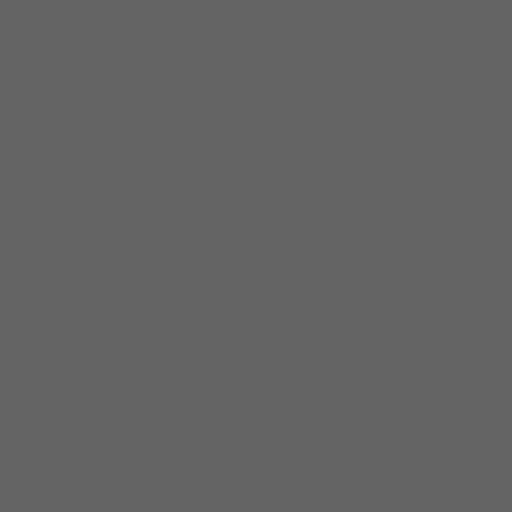
[frame 1/320  lung]
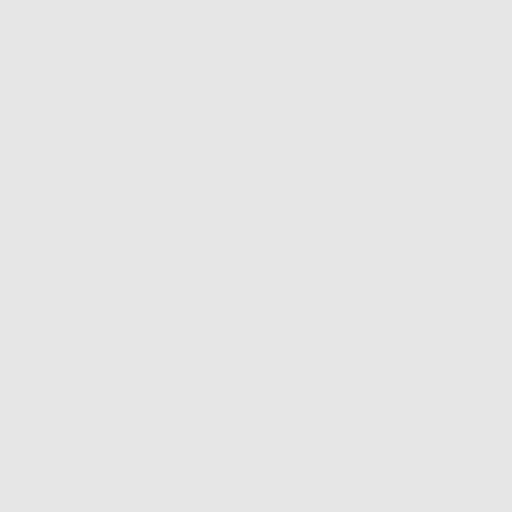
[frame 36/320  lung]
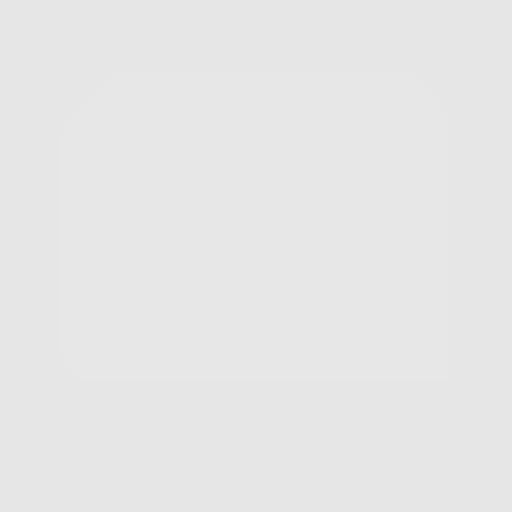
[frame 71/320  lung]
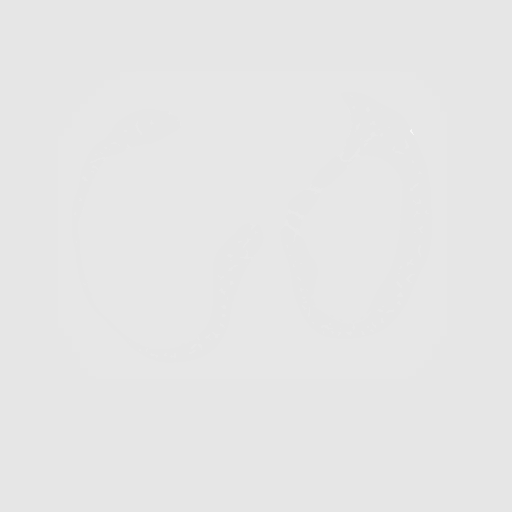
[frame 107/320  lung]
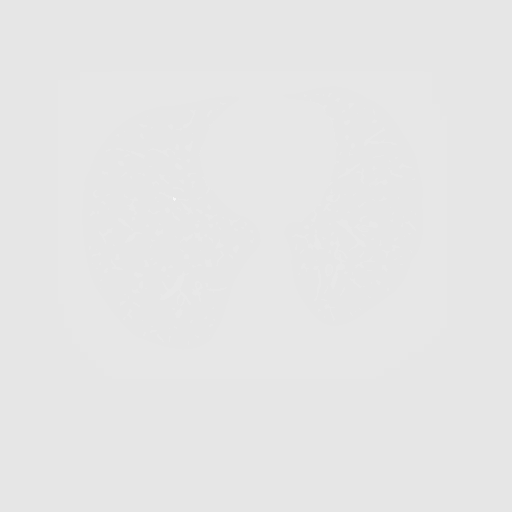
[frame 142/320  mediastinal]
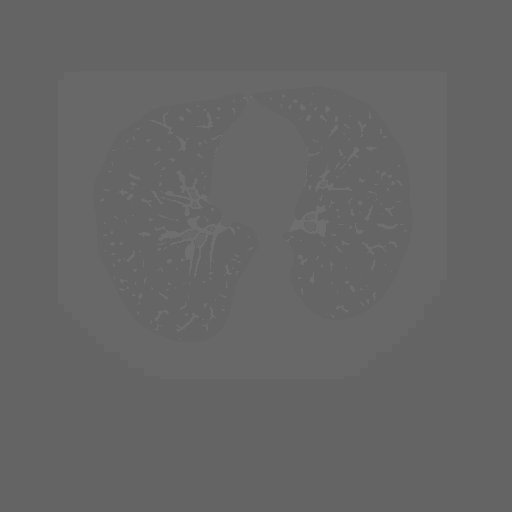
[frame 142/320  lung]
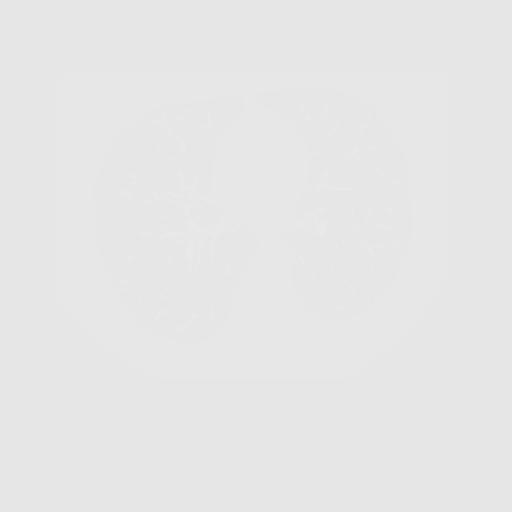
[frame 178/320  lung]
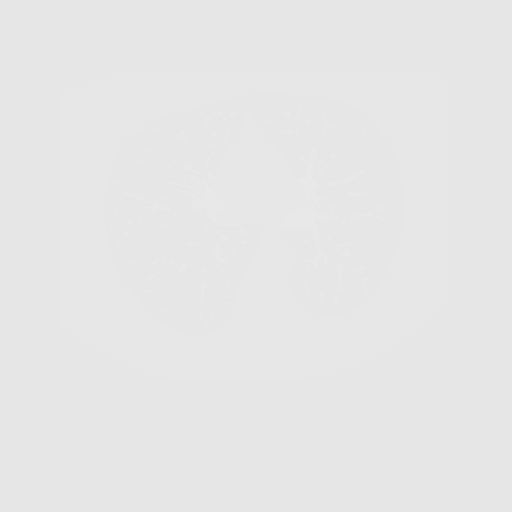
[frame 213/320  lung]
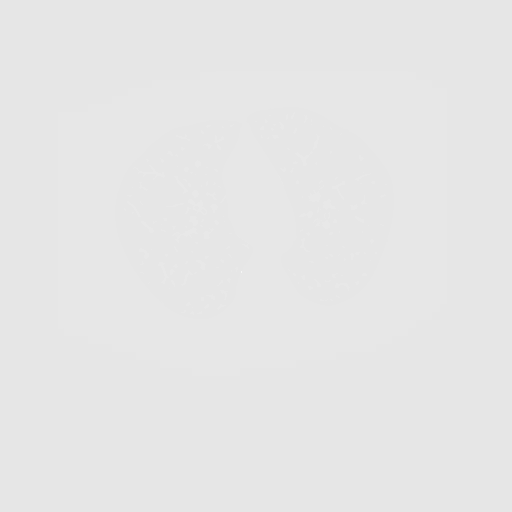
[frame 249/320  lung]
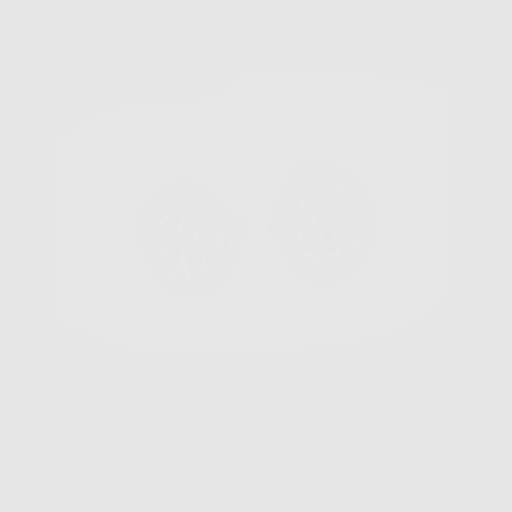
[frame 284/320  mediastinal]
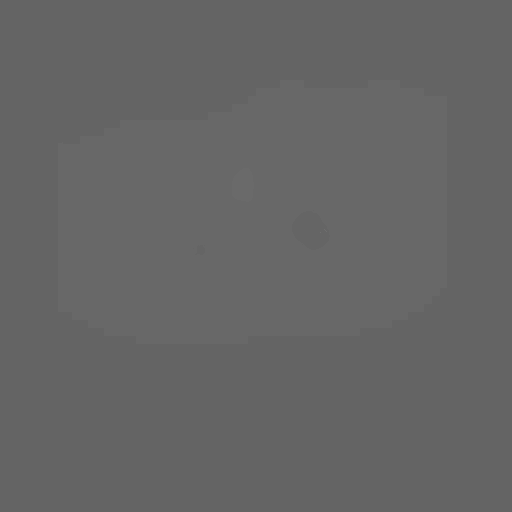
[frame 284/320  lung]
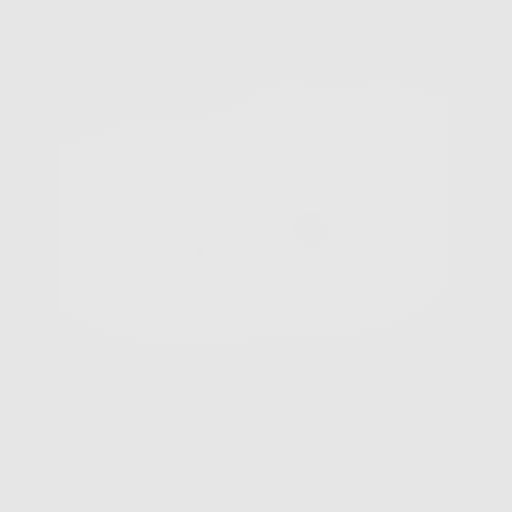

[9 of 40 positions shown; findings below may reference images not displayed]

FINDINGS: Cardiovascular: Heart size is normal. There is no significant
pericardial fluid, thickening or pericardial calcification. There is
aortic atherosclerosis, as well as atherosclerosis of the great
vessels of the mediastinum and the coronary arteries, including
calcified atherosclerotic plaque in the left main, left anterior
descending, left circumflex and right coronary arteries.

Mediastinum/Nodes: No pathologically enlarged mediastinal or hilar
lymph nodes. Please note that accurate exclusion of hilar adenopathy
is limited on noncontrast CT scans. Partially calcified nodule in
the left lobe of the thyroid gland measuring 1.6 x 1.2 cm (axial
image 10 of series 2). Esophagus is unremarkable in appearance. No
axillary lymphadenopathy.

Lungs/Pleura: Tiny pulmonary nodules are noted, largest of which is
in the medial aspect of the right upper lobe (axial image 99 of
series 3), with a volume derived mean diameter of 4.5 mm. No larger
more suspicious appearing pulmonary nodules or masses are noted. No
acute consolidative airspace disease. No pleural effusions. Mild
diffuse bronchial wall thickening with mild centrilobular and
paraseptal emphysema.

Upper Abdomen: Aortic atherosclerosis.

Musculoskeletal: There are no aggressive appearing lytic or blastic
lesions noted in the visualized portions of the skeleton.
IMPRESSION: 1. Lung-RADS 2S, benign appearance or behavior. Continue annual
screening with low-dose chest CT without contrast in 12 months.
2. The "S" modifier above refers to potentially clinically
significant non lung cancer related findings. Specifically, there is
aortic atherosclerosis, in addition to left main and 3 vessel
coronary artery disease. Please note that although the presence of
coronary artery calcium documents the presence of coronary artery
disease, the severity of this disease and any potential stenosis
cannot be assessed on this non-gated CT examination. Assessment for
potential risk factor modification, dietary therapy or pharmacologic
therapy may be warranted, if clinically indicated.
3. Mild diffuse bronchial wall thickening with mild centrilobular
and paraseptal emphysema; imaging findings suggestive of underlying
COPD.
4. Partially calcified nodule in the left lobe of the thyroid gland
measuring 1.6 x 1.2 cm. Recommend thyroid US (ref: [HOSPITAL].
[DATE]): 143-50).

Aortic Atherosclerosis ([74]-[74]) and Emphysema ([74]-[74]).

## 2020-12-02 ENCOUNTER — Other Ambulatory Visit: Payer: Self-pay | Admitting: Orthopaedic Surgery

## 2020-12-02 DIAGNOSIS — M542 Cervicalgia: Secondary | ICD-10-CM

## 2020-12-10 ENCOUNTER — Ambulatory Visit
Admission: RE | Admit: 2020-12-10 | Discharge: 2020-12-10 | Disposition: A | Discharge status: Home / Self Care | Payer: Medicare Other | Source: Ambulatory Visit | Attending: Orthopaedic Surgery | Admitting: Orthopaedic Surgery

## 2020-12-10 ENCOUNTER — Other Ambulatory Visit: Payer: Self-pay

## 2020-12-10 DIAGNOSIS — M542 Cervicalgia: Secondary | ICD-10-CM

## 2020-12-10 DIAGNOSIS — M4802 Spinal stenosis, cervical region: Secondary | ICD-10-CM | POA: Diagnosis not present

## 2020-12-10 IMAGING — MR MR CERVICAL SPINE W/O CM
4 of 5 series · 29 of 48 positions shown · non-contrast
Comparison: None.

CLINICAL DATA: Neck pain.

EXAM:
MRI CERVICAL SPINE WITHOUT CONTRAST
TECHNIQUE: Multiplanar, multisequence MR imaging of the cervical spine was
performed. No intravenous contrast was administered.

[Series 2: T2 · sagittal · 3.0mm · 0.66mm/px · 8 of 15 slices shown (1 of 2)]
[im 1/15]
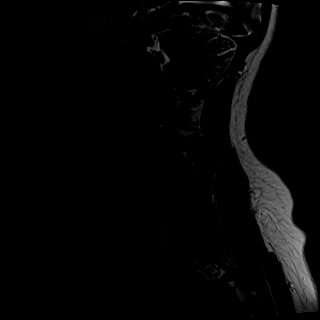
[im 3/15]
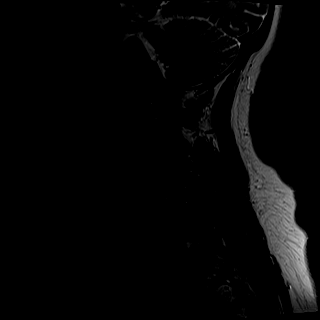
[im 5/15]
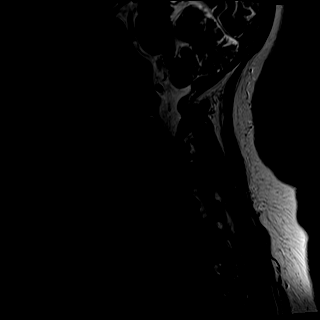
[im 7/15]
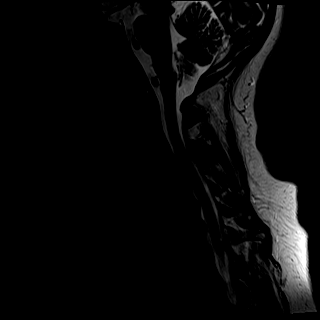
[im 9/15]
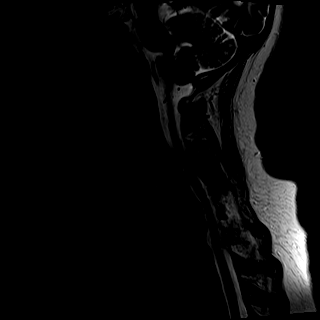
[im 11/15]
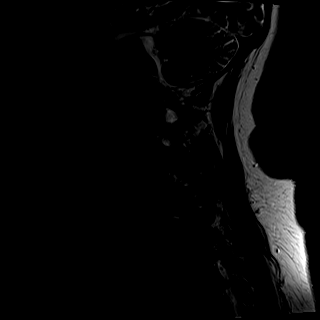
[im 13/15]
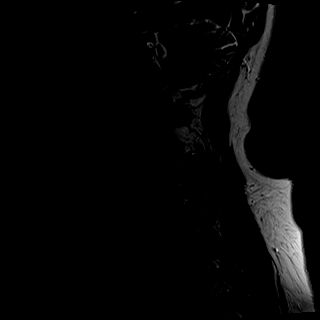
[im 15/15]
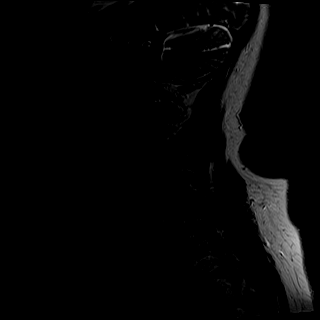

[Series 5: GRE · axial · 3.0mm · 0.35mm/px · z∈[-39,+34]mm · 5 of 27 slices shown]
[im 1/27]
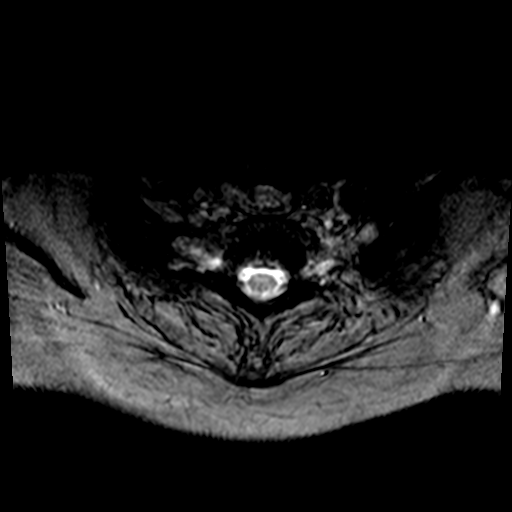
[im 5/27]
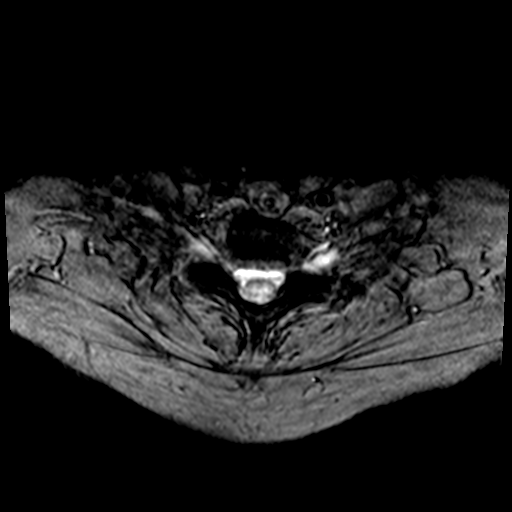
[im 9/27]
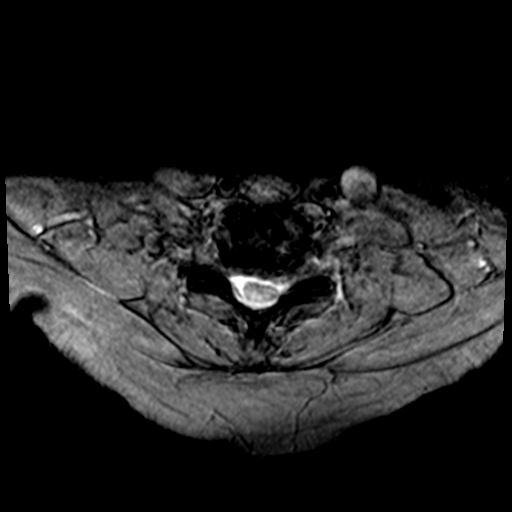
[im 14/27]
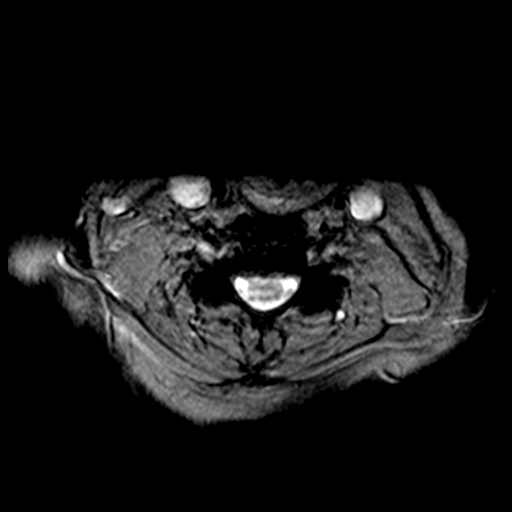
[im 22/27]
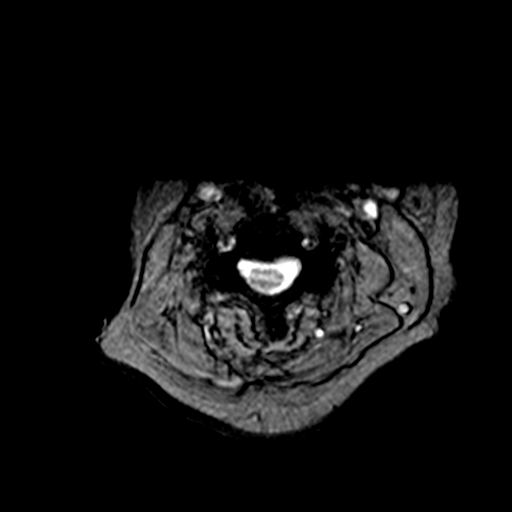

[Series 8: T1 · sagittal · 3.0mm · 0.41mm/px · 7 of 15 slices shown]
[im 1/15]
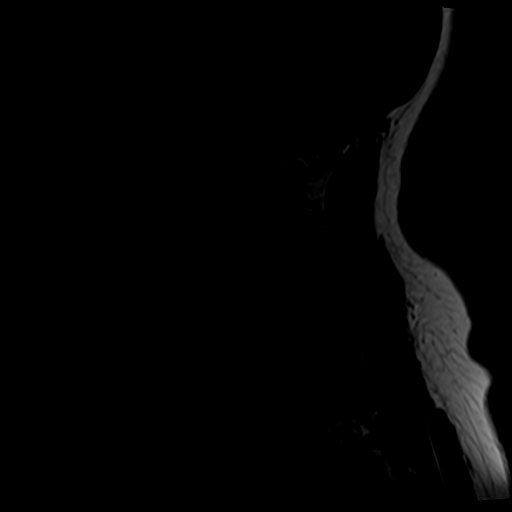
[im 3/15]
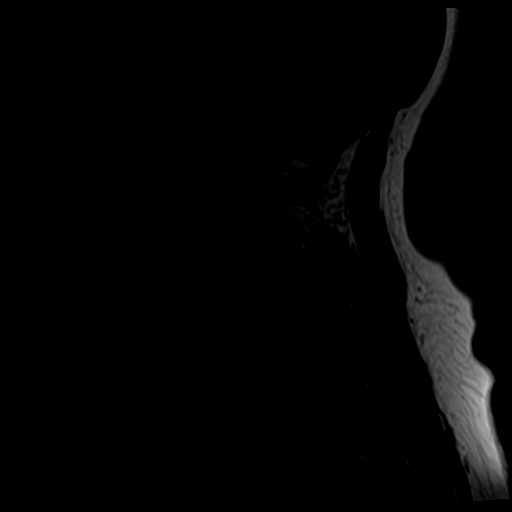
[im 5/15]
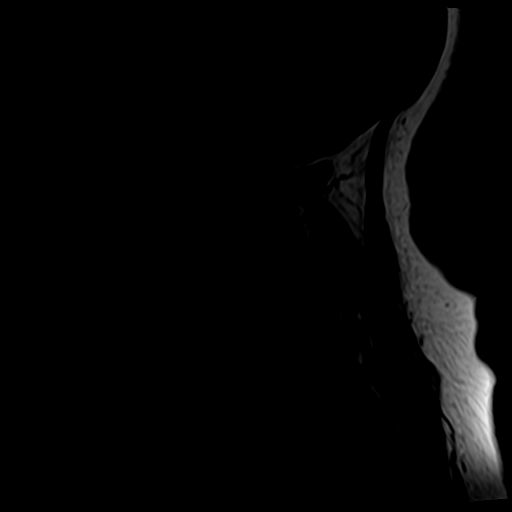
[im 8/15]
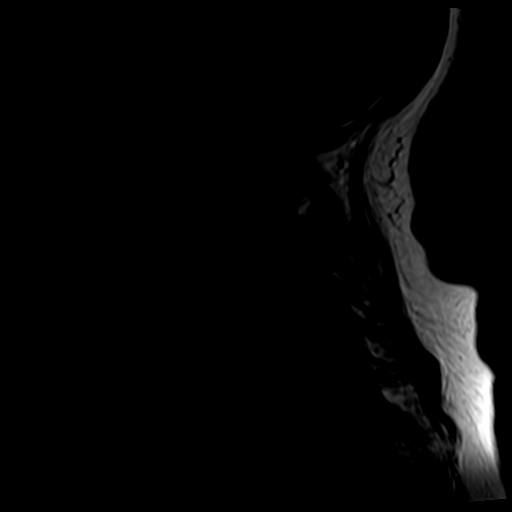
[im 10/15]
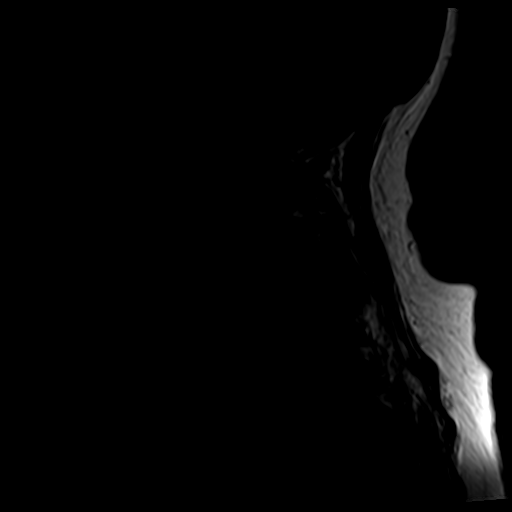
[im 12/15]
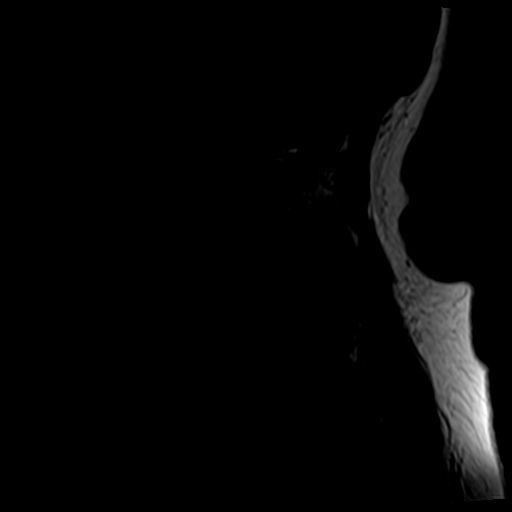
[im 15/15]
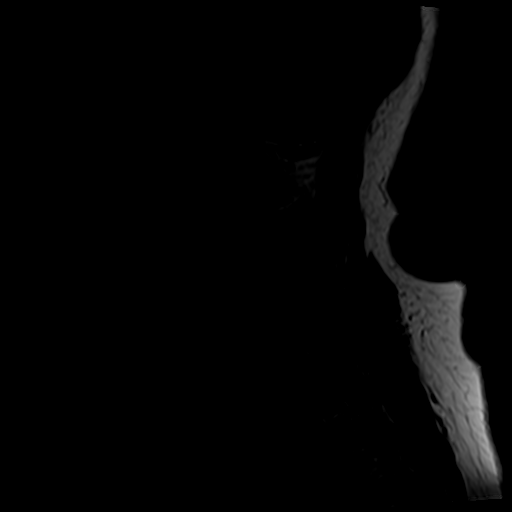

[Series 9: T2 · axial · 3.0mm · 0.70mm/px · z∈[-39,+51]mm · 9 of 27 slices shown (2 of 2)]
[im 1/27]
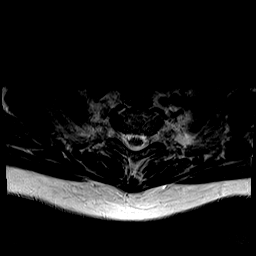
[im 5/27]
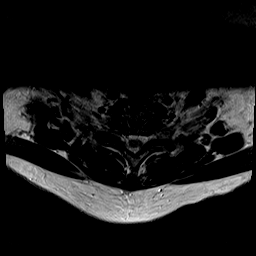
[im 9/27]
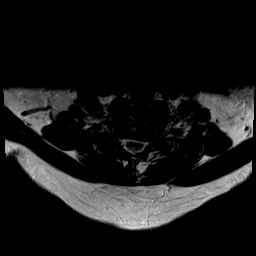
[im 11/27]
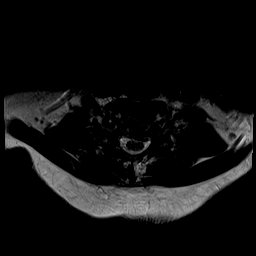
[im 14/27]
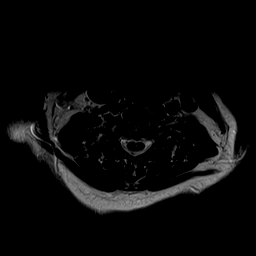
[im 16/27]
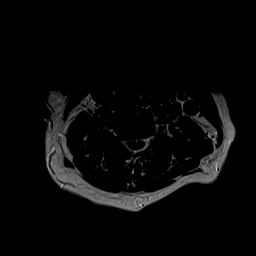
[im 18/27]
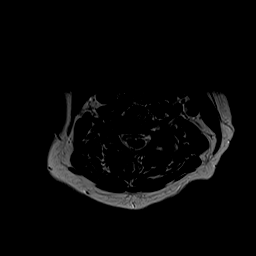
[im 22/27]
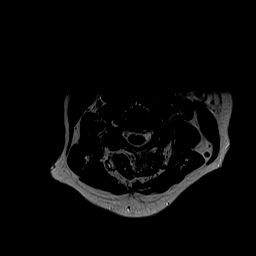
[im 27/27]
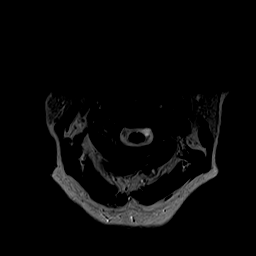

[29 of 48 positions shown; findings below may reference images not displayed]

FINDINGS: Motion limited study despite multiple repeat attempts.

Alignment: Reversal of the normal cervical lordosis. No substantial
sagittal subluxation.

Vertebrae: Vertebral body heights are maintained. No focal marrow
edema to suggest acute fracture or discitis/osteomyelitis.

Cord: Normal cord signal.

Posterior Fossa, vertebral arteries, paraspinal tissues: Visualized
vertebral artery flow voids maintained. No visible paraspinal edema.
Unremarkable appearance of the visualized posterior fossa.

Disc levels:

C2-C3: No significant disc protrusion, foraminal stenosis, or canal
stenosis.

C3-C4: Left eccentric posterior disc osteophyte complex with left
greater than right facet and uncovertebral hypertrophy. Resulting
severe left and moderate right foraminal stenosis with moderate
canal stenosis.

C4-C5: Right eccentric posterior disc osteophyte complex with right
facet/uncovertebral hypertrophy. Resulting moderate to severe right
foraminal stenosis. No significant canal or left foraminal stenosis.

C5-C6: Posterior disc osteophyte complex with bilateral
uncovertebral hypertrophy. Resulting mild left greater than right
foraminal stenosis. Disc contacts and flattens the left ventral cord
with mild canal stenosis.

C6-C7: Posterior disc osteophyte complex with bilateral
uncovertebral hypertrophy resulting moderate bilateral foraminal
stenosis and mild canal stenosis.

C7-T1: Right eccentric uncovertebral hypertrophy with mild right
foraminal stenosis. No significant canal or left foraminal stenosis.
IMPRESSION: 1. Severe foraminal stenosis on the left at C3-C4. Moderate to
severe foraminal stenosis on the right at C4-C5 and moderate
bilateral foraminal stenosis at C6-C7.
2. Moderate canal stenosis at C3-C4. Mild canal stenosis at C4-C5,
C5-C6, and C6-C7 with disc flattening the left ventral cord at
C5-C6.

## 2020-12-15 ENCOUNTER — Other Ambulatory Visit: Payer: POS

## 2020-12-22 DIAGNOSIS — F4312 Post-traumatic stress disorder, chronic: Secondary | ICD-10-CM | POA: Diagnosis not present

## 2021-02-08 DIAGNOSIS — E1169 Type 2 diabetes mellitus with other specified complication: Secondary | ICD-10-CM | POA: Diagnosis not present

## 2021-02-08 DIAGNOSIS — R202 Paresthesia of skin: Secondary | ICD-10-CM | POA: Diagnosis not present

## 2021-02-08 DIAGNOSIS — I2510 Atherosclerotic heart disease of native coronary artery without angina pectoris: Secondary | ICD-10-CM | POA: Diagnosis not present

## 2021-02-08 DIAGNOSIS — M545 Low back pain, unspecified: Secondary | ICD-10-CM | POA: Diagnosis not present

## 2021-02-08 DIAGNOSIS — N3281 Overactive bladder: Secondary | ICD-10-CM | POA: Diagnosis not present

## 2021-02-08 DIAGNOSIS — Z9989 Dependence on other enabling machines and devices: Secondary | ICD-10-CM | POA: Diagnosis not present

## 2021-02-08 DIAGNOSIS — J439 Emphysema, unspecified: Secondary | ICD-10-CM | POA: Diagnosis not present

## 2021-02-08 DIAGNOSIS — I10 Essential (primary) hypertension: Secondary | ICD-10-CM | POA: Diagnosis not present

## 2021-02-10 DIAGNOSIS — F4312 Post-traumatic stress disorder, chronic: Secondary | ICD-10-CM | POA: Diagnosis not present

## 2021-02-23 DIAGNOSIS — D333 Benign neoplasm of cranial nerves: Secondary | ICD-10-CM | POA: Diagnosis not present

## 2021-02-25 ENCOUNTER — Other Ambulatory Visit: Payer: Self-pay

## 2021-02-25 ENCOUNTER — Encounter (HOSPITAL_COMMUNITY): Payer: Self-pay

## 2021-02-25 ENCOUNTER — Emergency Department (HOSPITAL_COMMUNITY)
Admission: EM | Admit: 2021-02-25 | Discharge: 2021-02-25 | Disposition: A | Discharge status: Home / Self Care | Payer: Medicare Other | Attending: Emergency Medicine | Admitting: Emergency Medicine

## 2021-02-25 DIAGNOSIS — Z5321 Procedure and treatment not carried out due to patient leaving prior to being seen by health care provider: Secondary | ICD-10-CM | POA: Insufficient documentation

## 2021-02-25 DIAGNOSIS — R202 Paresthesia of skin: Secondary | ICD-10-CM | POA: Insufficient documentation

## 2021-02-25 DIAGNOSIS — R200 Anesthesia of skin: Secondary | ICD-10-CM | POA: Insufficient documentation

## 2021-02-25 DIAGNOSIS — R531 Weakness: Secondary | ICD-10-CM | POA: Diagnosis not present

## 2021-02-25 LAB — CBC
HCT: 43 % (ref 36.0–46.0)
Hemoglobin: 14 g/dL (ref 12.0–15.0)
MCH: 32.1 pg (ref 26.0–34.0)
MCHC: 32.6 g/dL (ref 30.0–36.0)
MCV: 98.6 fL (ref 80.0–100.0)
Platelets: 347 10*3/uL (ref 150–400)
RBC: 4.36 MIL/uL (ref 3.87–5.11)
RDW: 15.7 % — ABNORMAL HIGH (ref 11.5–15.5)
WBC: 7.9 10*3/uL (ref 4.0–10.5)
nRBC: 0 % (ref 0.0–0.2)

## 2021-02-25 LAB — BASIC METABOLIC PANEL
Anion gap: 3 — ABNORMAL LOW (ref 5–15)
BUN: 13 mg/dL (ref 8–23)
CO2: 26 mmol/L (ref 22–32)
Calcium: 8.6 mg/dL — ABNORMAL LOW (ref 8.9–10.3)
Chloride: 111 mmol/L (ref 98–111)
Creatinine, Ser: 0.66 mg/dL (ref 0.44–1.00)
GFR, Estimated: >60 mL/min (ref >60)
Glucose, Bld: 109 mg/dL — ABNORMAL HIGH (ref 70–99)
Potassium: 3.6 mmol/L (ref 3.5–5.1)
Sodium: 140 mmol/L (ref 135–145)

## 2021-02-25 NOTE — ED Provider Triage Note (Signed)
Emergency Medicine Provider Triage Evaluation Note  Karla Baker , a 61 y.o. female  was evaluated in triage.  Pt complains of generalized weakness numbness/tingling in extremities.  Patient with known history of severe foraminal stenosis of her cervical spine for which she is seen neurosurgery in the past and received steroid injections with the last one being 2-1/2 weeks ago.  Last MRI of her C-spine was 10/22. She states that in the past oral steroids and injections have given her significant relief, however this time there was no relief.  She now states that she is too weak to walk even with the assistance of a walker due to numbness and weakness in her arms.  Review of Systems  Positive: Bilateral upper and lower extremity weakness and numbness Negative: Fever, chills  Physical Exam  BP (!) 87/71 (BP Location: Left Arm)    Pulse 83    Temp 98 F (36.7 C) (Oral)    Resp 18    Ht 5\' 2"  (1.575 m)    Wt 64.4 kg    SpO2 94%    BMI 25.97 kg/m  Gen:   Awake, no distress   Resp:  Normal effort  MSK:   Moves extremities without difficulty  Other:    Medical Decision Making  Medically screening exam initiated at 10:36 AM.  Appropriate orders placed.  Karla Baker was informed that the remainder of the evaluation will be completed by another provider, this initial triage assessment does not replace that evaluation, and the importance of remaining in the ED until their evaluation is complete.     Bud Face, PA-C 02/25/21 1041

## 2021-02-25 NOTE — ED Triage Notes (Signed)
Patient reports that she has numbness of her extremities. Patient states she was unable to walk this AM. Patient states she "had a shot to her neck 2 1/2 weeks ago and since receiving the shot to her neck, the numbness has gotten worse." Patient states she has workman's comp for the same.

## 2021-02-27 DIAGNOSIS — M542 Cervicalgia: Secondary | ICD-10-CM | POA: Diagnosis not present

## 2021-03-02 DIAGNOSIS — M5412 Radiculopathy, cervical region: Secondary | ICD-10-CM | POA: Diagnosis not present

## 2021-03-03 ENCOUNTER — Other Ambulatory Visit (HOSPITAL_COMMUNITY): Payer: Self-pay | Admitting: Physical Medicine and Rehabilitation

## 2021-03-03 ENCOUNTER — Other Ambulatory Visit: Payer: Self-pay | Admitting: Physical Medicine and Rehabilitation

## 2021-03-03 DIAGNOSIS — M5416 Radiculopathy, lumbar region: Secondary | ICD-10-CM

## 2021-03-03 DIAGNOSIS — Z9889 Other specified postprocedural states: Secondary | ICD-10-CM

## 2021-03-03 DIAGNOSIS — M5412 Radiculopathy, cervical region: Secondary | ICD-10-CM

## 2021-03-05 ENCOUNTER — Ambulatory Visit (HOSPITAL_COMMUNITY)
Admission: RE | Admit: 2021-03-05 | Discharge: 2021-03-05 | Disposition: A | Discharge status: Home / Self Care | Payer: Medicare Other | Source: Ambulatory Visit | Attending: Physical Medicine and Rehabilitation | Admitting: Physical Medicine and Rehabilitation

## 2021-03-05 ENCOUNTER — Other Ambulatory Visit: Payer: Self-pay

## 2021-03-05 DIAGNOSIS — M5416 Radiculopathy, lumbar region: Secondary | ICD-10-CM | POA: Insufficient documentation

## 2021-03-05 DIAGNOSIS — M5126 Other intervertebral disc displacement, lumbar region: Secondary | ICD-10-CM | POA: Diagnosis not present

## 2021-03-05 DIAGNOSIS — Z9889 Other specified postprocedural states: Secondary | ICD-10-CM | POA: Insufficient documentation

## 2021-03-05 DIAGNOSIS — M47816 Spondylosis without myelopathy or radiculopathy, lumbar region: Secondary | ICD-10-CM | POA: Diagnosis not present

## 2021-03-05 DIAGNOSIS — M4802 Spinal stenosis, cervical region: Secondary | ICD-10-CM | POA: Diagnosis not present

## 2021-03-05 DIAGNOSIS — M5412 Radiculopathy, cervical region: Secondary | ICD-10-CM | POA: Diagnosis not present

## 2021-03-05 DIAGNOSIS — M2578 Osteophyte, vertebrae: Secondary | ICD-10-CM | POA: Diagnosis not present

## 2021-03-05 IMAGING — MR MR CERVICAL SPINE W/O CM
5 series · 34 of 48 positions shown · non-contrast
Comparison: Cervical spine MRI [DATE]

CLINICAL DATA: Radiculopathy, pain in fingers

EXAM:
MRI CERVICAL SPINE WITHOUT CONTRAST
TECHNIQUE: Multiplanar, multisequence MR imaging of the cervical spine was
performed. No intravenous contrast was administered.

[Series 5: T2 · sagittal · 3.0mm · 0.69mm/px · 6 of 15 slices shown (1 of 2)]
[im 1/15]
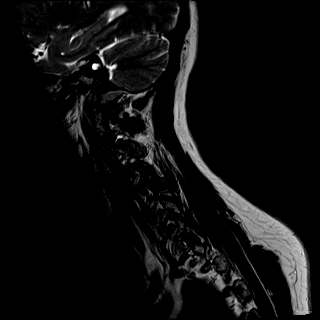
[im 3/15]
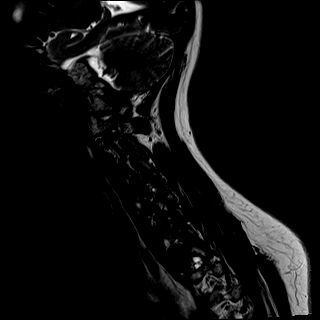
[im 6/15]
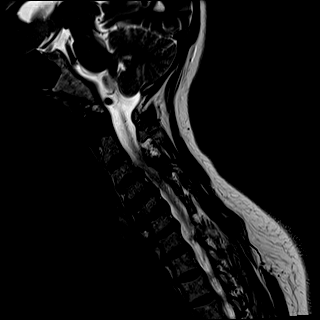
[im 9/15]
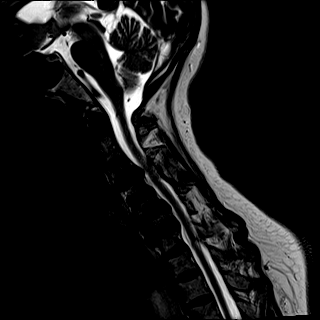
[im 12/15]
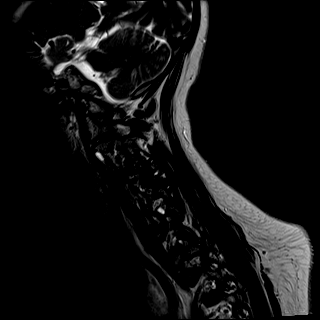
[im 15/15]
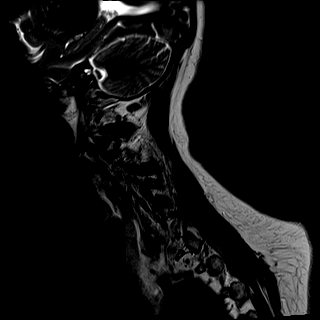

[Series 6: T1 · sagittal · 3.0mm · 0.69mm/px · 6 of 15 slices shown]
[im 1/15]
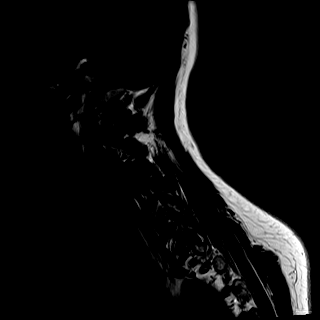
[im 3/15]
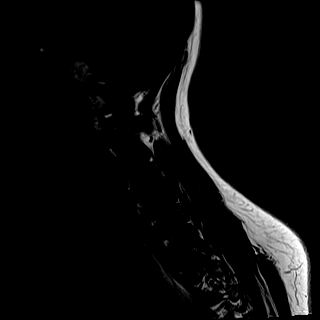
[im 6/15]
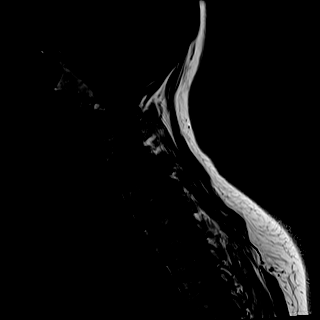
[im 9/15]
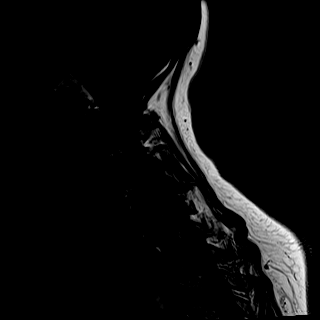
[im 12/15]
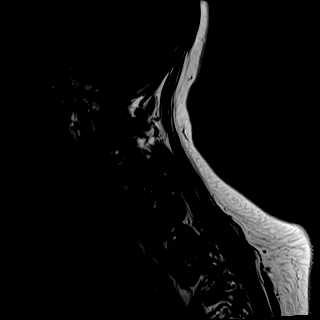
[im 15/15]
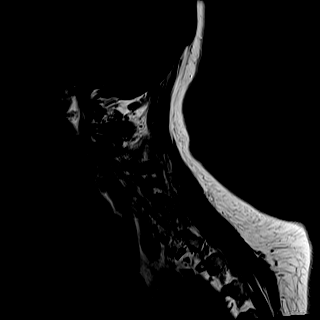

[Series 7: STIR · sagittal · 3.0mm · 0.86mm/px · 6 of 15 slices shown]
[im 1/15]
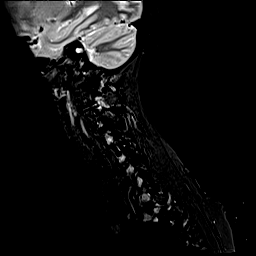
[im 3/15]
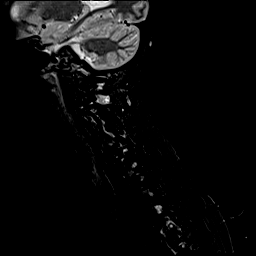
[im 6/15]
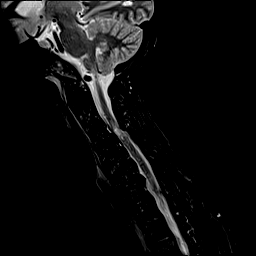
[im 9/15]
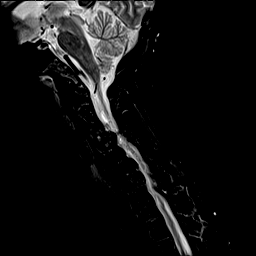
[im 12/15]
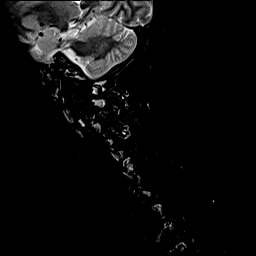
[im 15/15]
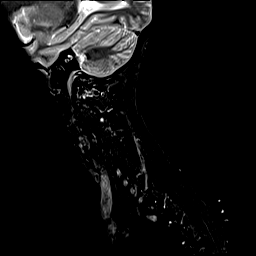

[Series 8: T2 · axial · 3.0mm · 0.66mm/px · z∈[-132,-19]mm · 9 of 39 slices shown (2 of 2)]
[im 1/39]
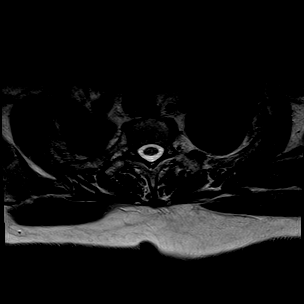
[im 6/39]
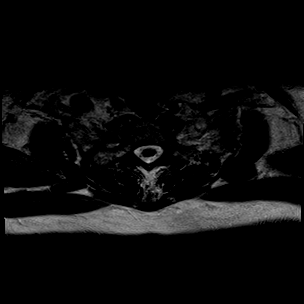
[im 11/39]
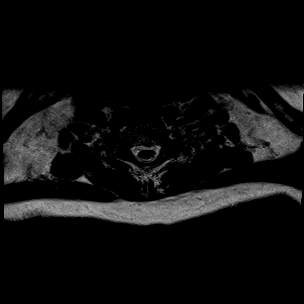
[im 17/39]
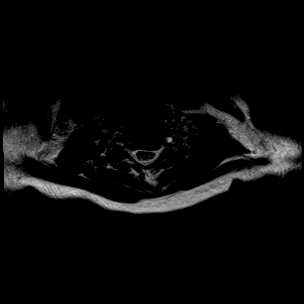
[im 20/39]
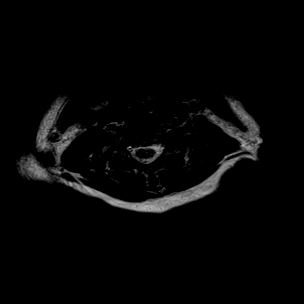
[im 22/39]
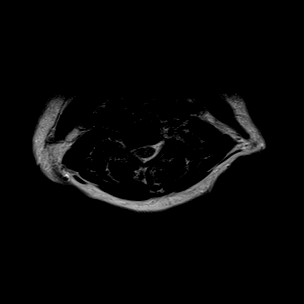
[im 28/39]
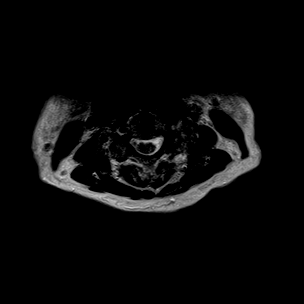
[im 33/39]
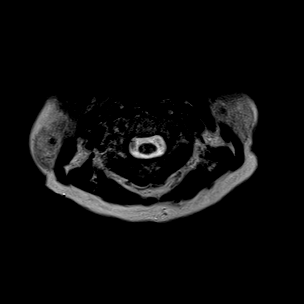
[im 39/39]
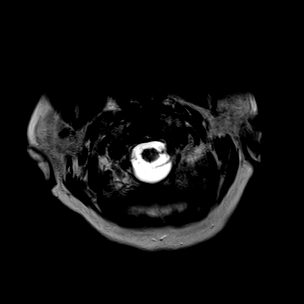

[Series 9: GRE · axial · 3.0mm · 0.39mm/px · z∈[-132,-36]mm · 7 of 40 slices shown]
[im 1/40]
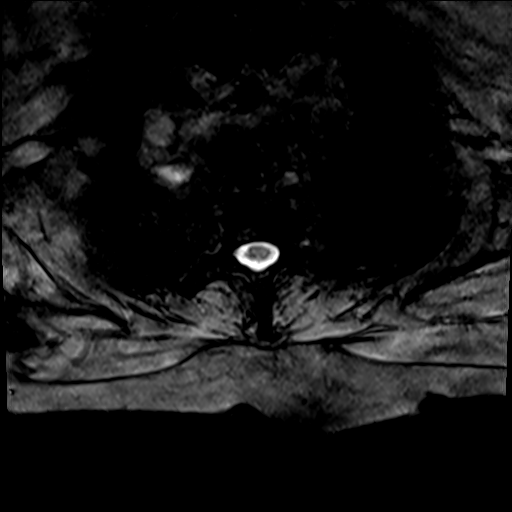
[im 6/40]
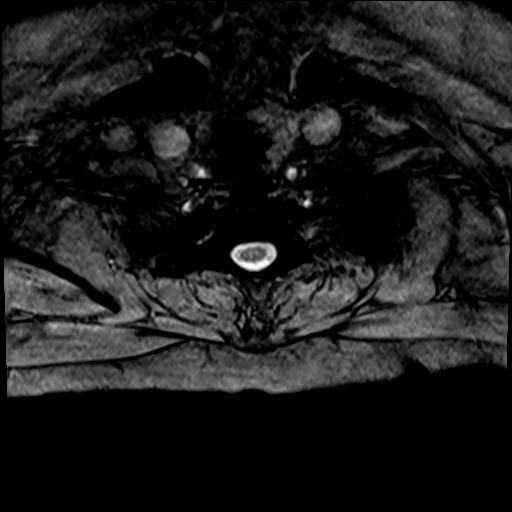
[im 12/40]
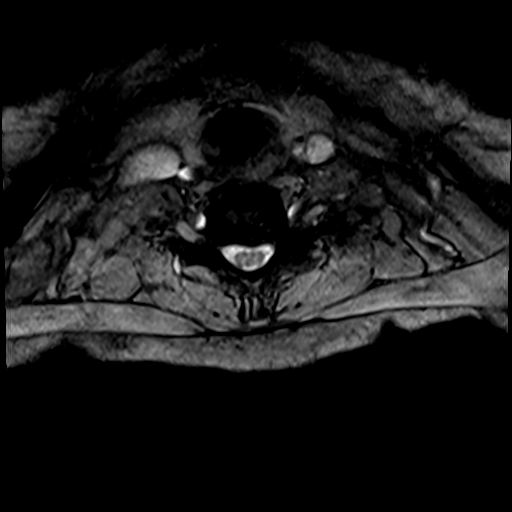
[im 17/40]
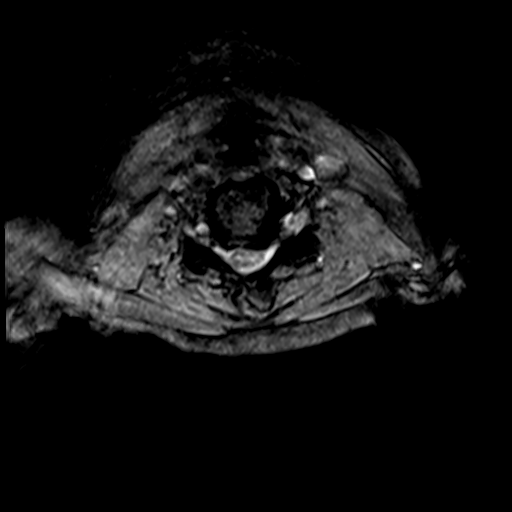
[im 23/40]
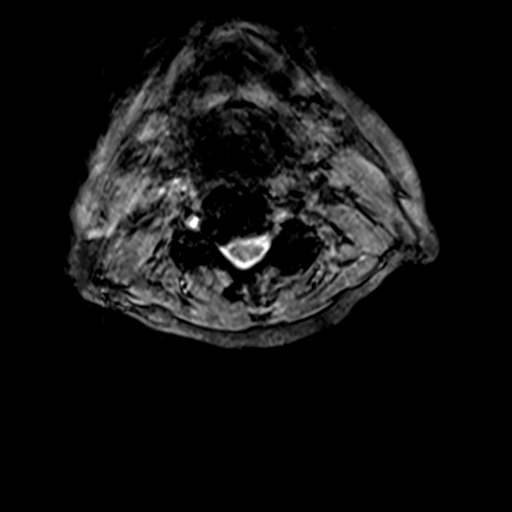
[im 28/40]
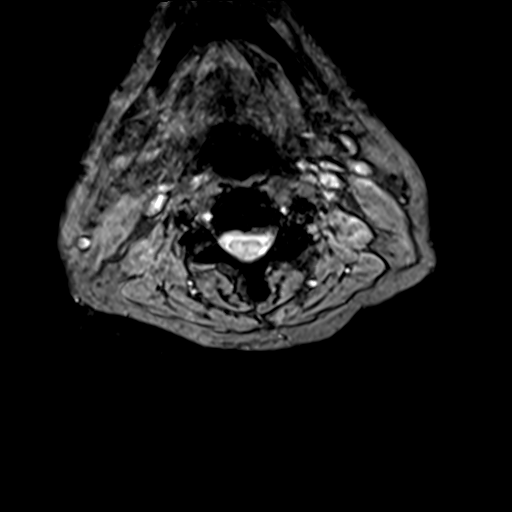
[im 34/40]
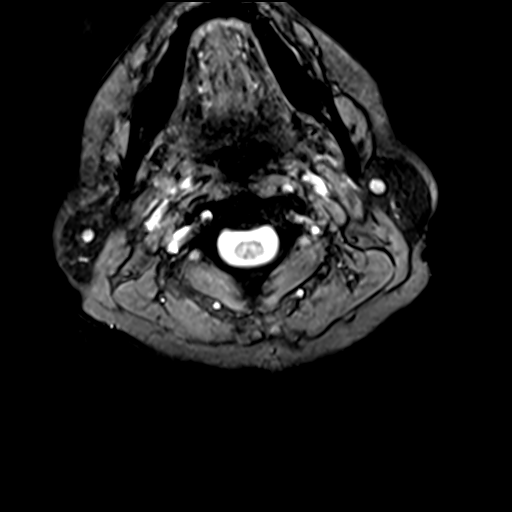

[34 of 48 positions shown; findings below may reference images not displayed]

FINDINGS: Alignment: There is slight reversal of the normal cervical spine
lordosis, similar to the prior study. There is grade 1
anterolisthesis of C3 on C4 and C4 on C5 which appears increased
since the prior study.

Vertebrae: Vertebral body heights are preserved. There is no
suspicious marrow signal abnormality. There is no marrow edema.

Cord: There is mild cord compression at C3-C4 with suspected faint
cord signal abnormality which may reflect edema or myelomalacia not
definitely seen on the prior study. The cord is otherwise normal in
signal and morphology.

Posterior Fossa, vertebral arteries, paraspinal tissues: The
posterior fossa is unremarkable. Paraspinal soft tissues are
unremarkable. The vertebral artery flow voids are present.

Disc levels:

C2-C3: No significant spinal canal or neural foraminal stenosis

C3-C4: There is a prominent posterior disc osteophyte complex
eccentric to the left with a probable left foraminal protrusion, and
uncovertebral and facet arthropathy resulting in severe left and
moderate right neural foraminal stenosis and moderate spinal canal
stenosis with slight cord compression and suspected cord signal
abnormality as above. The canal stenosis appears worsened compared
to the prior study. The foraminal stenoses are not significantly
changed.

C4-C5: There is right-sided uncovertebral ridging and right worse
than left facet arthropathy resulting in severe right and no
significant left neural foraminal stenosis and no significant spinal
canal stenosis, not significantly changed.

C5-C6: There is a posterior disc osteophyte complex and
uncovertebral and mild bilateral facet arthropathy resulting in mild
spinal canal stenosis and mild-to-moderate bilateral neural
foraminal stenosis, not significantly changed.

C6-C7: There is a mild posterior disc osteophyte complex and
uncovertebral and bilateral facet arthropathy resulting in mild
spinal canal stenosis and moderate to severe bilateral neural
foraminal stenosis, not significantly changed.

C7-T1: No high-grade spinal canal or neural foraminal stenosis.
IMPRESSION: 1. Grade 1 anterolisthesis of C3 on C4 and C4 on C5 appears slightly
increased since the study from [DATE]. Moderate spinal canal
stenosis at C3-C4 also appears increased, now with suspected cord
signal abnormality which may reflect edema or myelomalacia.
2. No other high-grade spinal canal stenosis, and no other evidence
of cord compression.
3. Severe right neural foraminal stenosis at C3-C4 and C4-C5, and
moderate to severe bilateral neural foraminal stenosis at C6-C7, not
significantly changed.

These results were called by telephone at the time of interpretation
on [DATE] at [DATE] to provider KRESCHT, who verbally
acknowledged these results.

## 2021-03-05 IMAGING — MR MR LUMBAR SPINE WO/W CM
4 of 7 series · 23 of 48 positions shown · IV contrast (gadavist)
Comparison: [DATE]

CLINICAL DATA: Numbness of extremities, lumbar radiculopathy,
history of lumbar surgery

EXAM:
MRI LUMBAR SPINE WITHOUT AND WITH CONTRAST
TECHNIQUE: Multiplanar and multiecho pulse sequences of the lumbar spine were
obtained without and with intravenous contrast.
CONTRAST:  6.5mL GADAVIST GADOBUTROL 1 MMOL/ML IV SOLN

[Series 10: T2 · sagittal · 4.0mm · 0.73mm/px · 4 of 16 slices shown (1 of 2)]
[im 1/16]
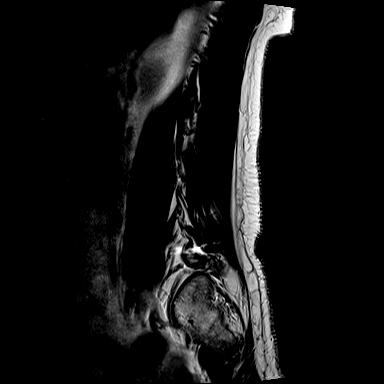
[im 6/16]
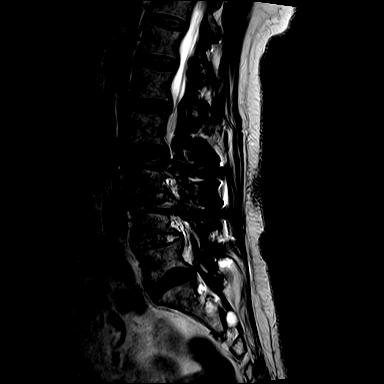
[im 11/16]
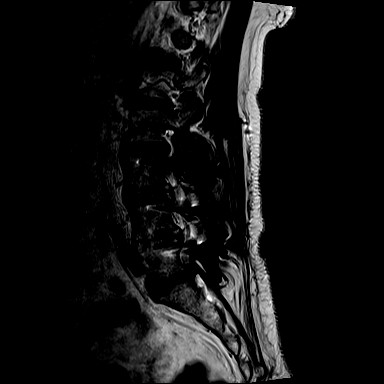
[im 16/16]
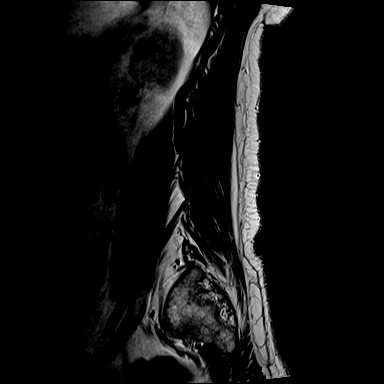

[Series 12: T1 · sagittal · 4.0mm · 0.88mm/px · 5 of 16 slices shown (1 of 2)]
[im 1/16]
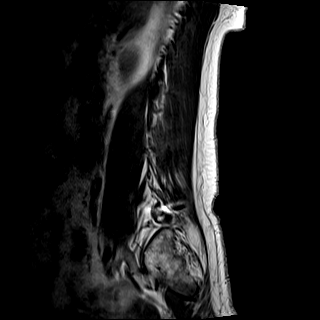
[im 4/16]
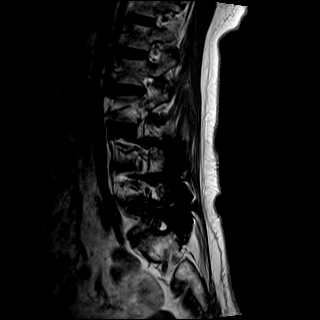
[im 8/16]
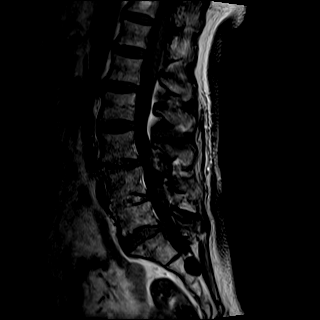
[im 12/16]
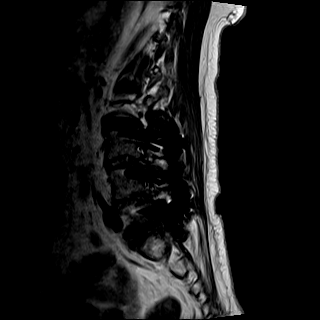
[im 16/16]
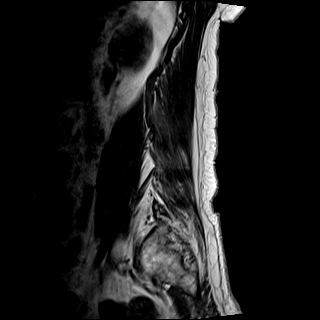

[Series 13: T2 · axial · 4.0mm · 0.57mm/px · z∈[-115,+92]mm · 8 of 35 slices shown (2 of 2)]
[im 1/35]
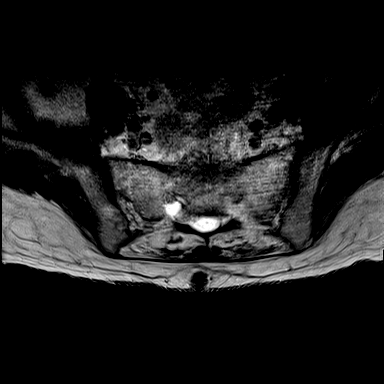
[im 4/35]
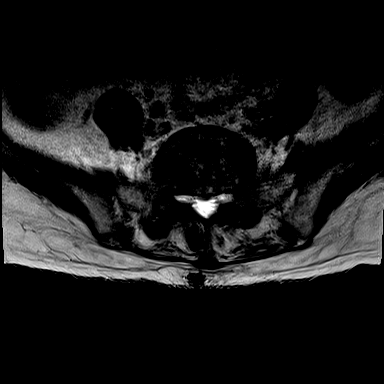
[im 12/35]
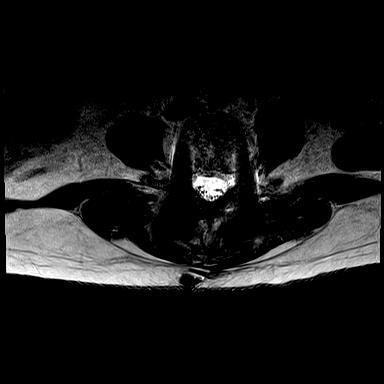
[im 16/35]
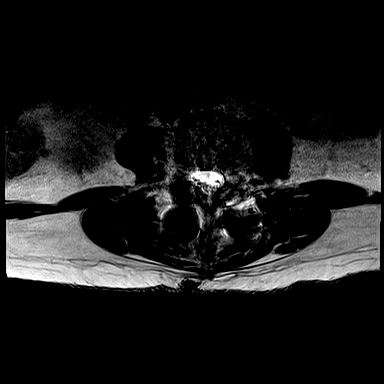
[im 19/35]
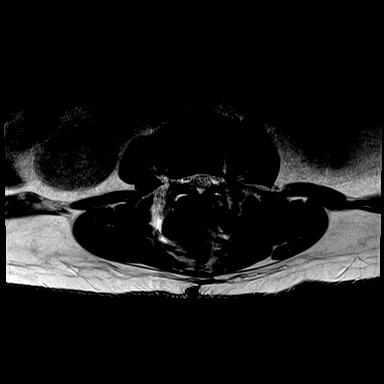
[im 23/35]
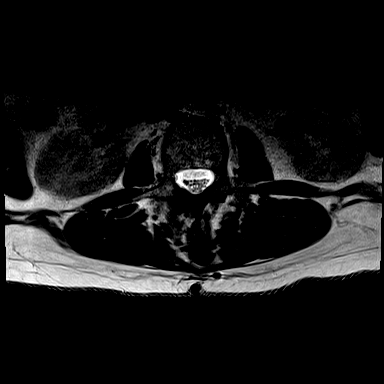
[im 31/35]
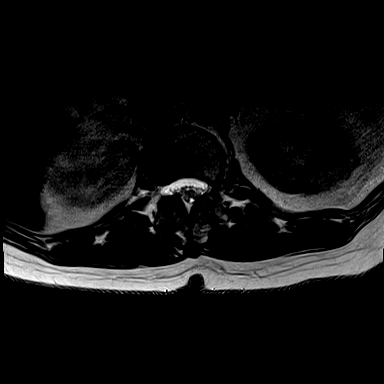
[im 35/35]
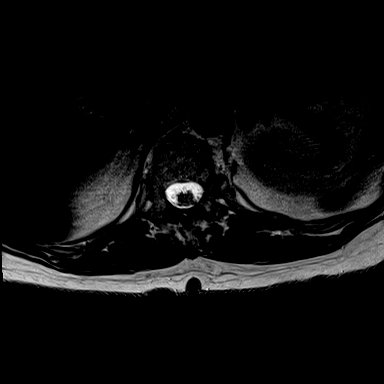

[Series 14: T1 · axial · 4.0mm · 0.34mm/px · z∈[-115,+72]mm · 6 of 35 slices shown (2 of 2)]
[im 1/35]
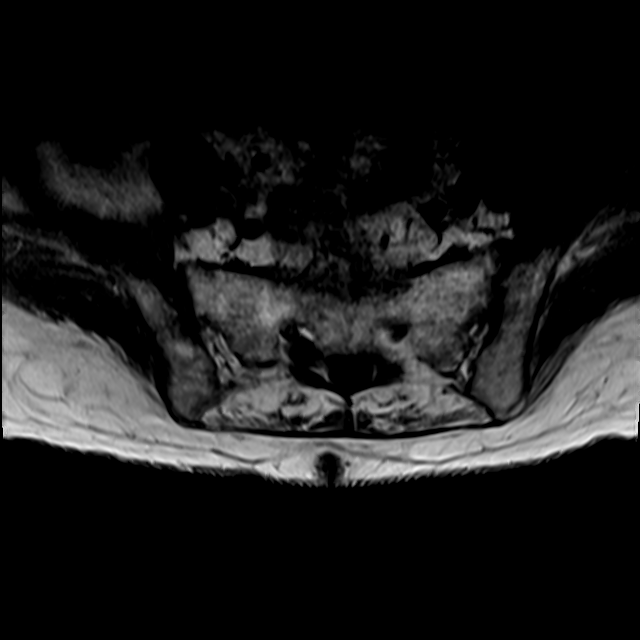
[im 4/35]
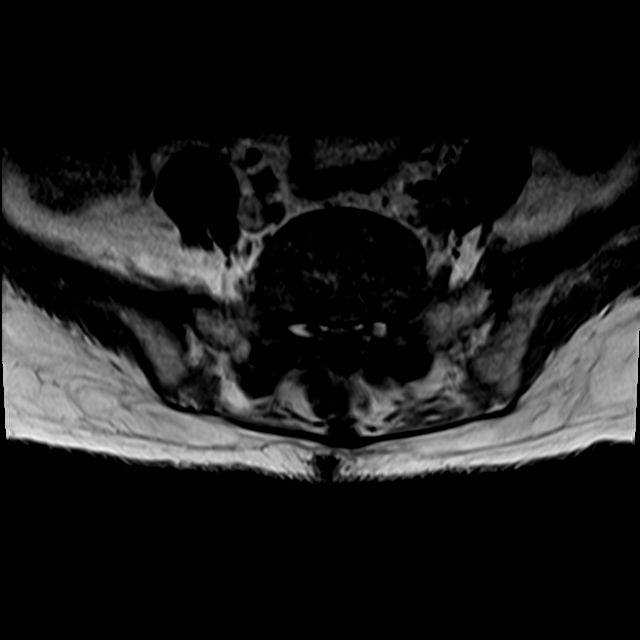
[im 12/35]
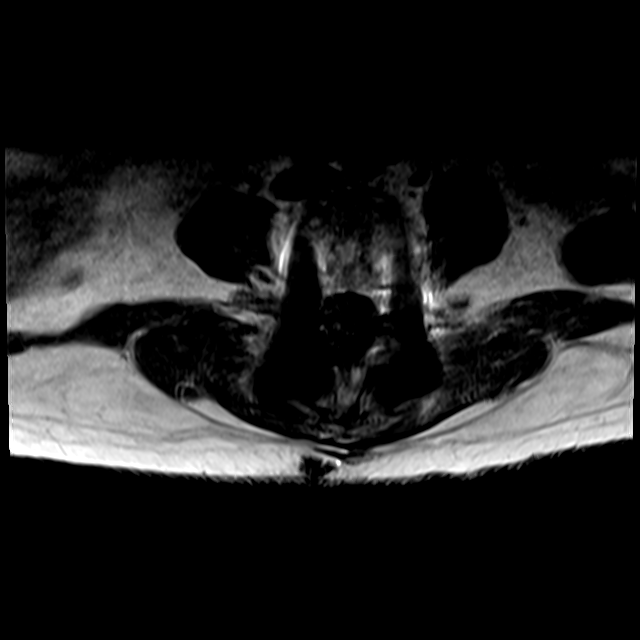
[im 16/35]
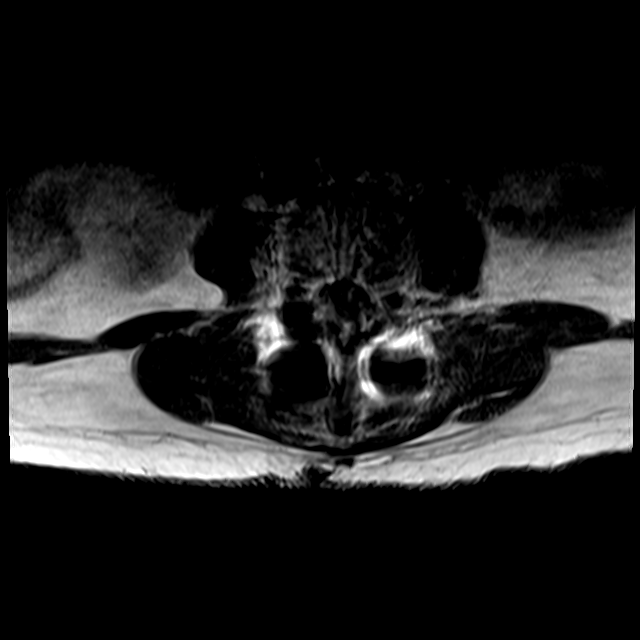
[im 19/35]
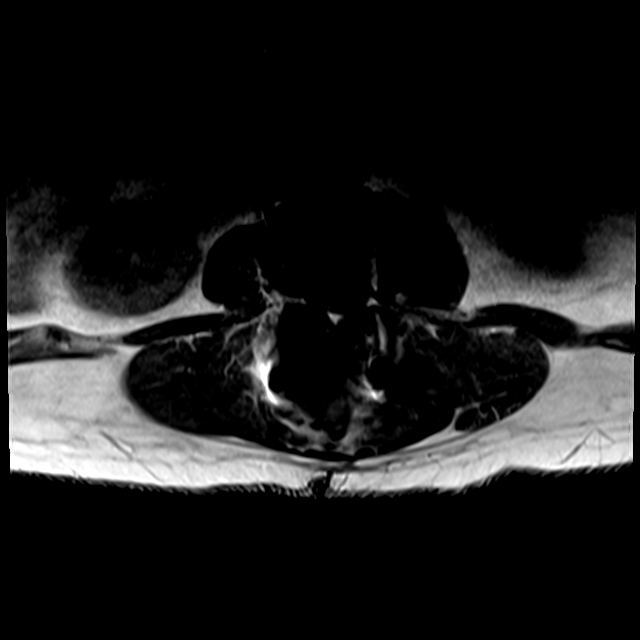
[im 31/35]
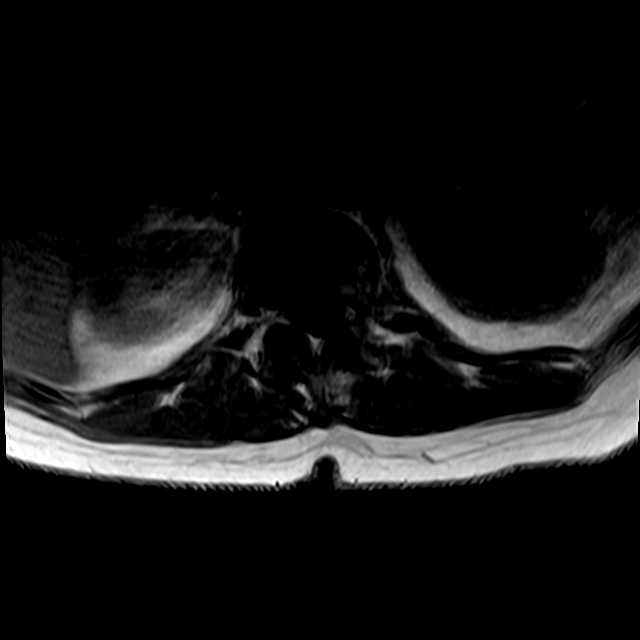

[23 of 48 positions shown; findings below may reference images not displayed]

FINDINGS: Motion artifact is present.

Segmentation: Standard.

Alignment:  No significant listhesis.

Vertebrae: Postoperative changes are again identified at L3-L5 with
rods and pedicle screws and interbody spacers. Hardware is not well
evaluated on this study and there is associated susceptibility
artifact. Vertebral body heights are maintained. No marrow edema. No
suspicious osseous lesion. Partially imaged vertebral body
hemangioma at T11. Incidental sacral Tarlov cysts.

Conus medullaris and cauda equina: Conus extends to the L1 level.
Conus and cauda equina appear normal. No abnormal intrathecal
enhancement

Paraspinal and other soft tissues: Unremarkable.

Disc levels:

L1-L2: Disc bulge and facet arthropathy with ligamentum flavum
infolding. Right facet synovial cyst has resolved. No significant
canal or foraminal stenosis.

L2-L3: Disc bulge. Chronic punctate left foraminal annular fissure.
Moderate to marked facet arthropathy with ligamentum flavum
infolding. Slightly increased mild to moderate canal stenosis.
Slight effacement of subarticular recesses. Slightly increased minor
foraminal stenosis.

L3-L4: Operative level with expected epidural enhancement. Endplate
osteophytes eccentric to the left. Residual right facet arthropathy.
Canal remains decompressed. Foramina remain patent.

L4-L5: Operative level with expected epidural enhancement. Endplate
osteophytes. Residual right facet arthropathy. Canal remains
decompressed. No significant foraminal stenosis.

L5-S1: Disc bulge with superimposed central protrusion. Moderate to
marked right and moderate left facet arthropathy. Increased moderate
canal stenosis with greater partial effacement of the subarticular
recesses. Mild right and moderate left foraminal stenosis. Disc
abuts the exiting left L5 nerve root with potential compression.
Likely mildly progressed from the prior study.
IMPRESSION: Postoperative and degenerative changes as detailed above. Right
facet synovial cyst at L1-L2 has resolved. Mild progression at L2-L3
and L5-S1. At L5-S1, there is greater narrowing of the canal and
subarticular recesses as well as left foraminal narrowing.

## 2021-03-05 MED ORDER — GADOBUTROL 1 MMOL/ML IV SOLN
6.5000 mL | Freq: Once | INTRAVENOUS | Status: AC | PRN
  Administered 2021-03-05: 6.5 mL via INTRAVENOUS

## 2021-03-09 ENCOUNTER — Other Ambulatory Visit: Payer: Self-pay | Admitting: Orthopedic Surgery

## 2021-03-19 NOTE — Pre-Procedure Instructions (Addendum)
Surgical Instructions    Your procedure is scheduled on Thursday, February 2nd.  Report to [facility] Main Entrance "A" at 08:45 A.M., then check in with the Admitting office.  Call this number if you have problems the morning of surgery:  [phone]   If you have any questions prior to your surgery date call [phone]: Open Monday-Friday 8am-4pm    Remember:  Do not eat after midnight the night before your surgery  You may drink clear liquids until 07:45 AM the morning of your surgery.   Clear liquids allowed are: Water, Non-Citrus Juices (without pulp), Carbonated Beverages, Clear Tea, Black Coffee Only (NO MILK, CREAM OR POWDERED CREAMER of any kind), and Gatorade.   Patient Instructions  The night before surgery:  No food after midnight. ONLY clear liquids after midnight   The day of surgery (if you have diabetes): Drink ONE (1) 12 oz G2 given to you in your pre admission testing appointment by 07:45 AM the morning of surgery. Drink in one sitting. Do not sip.  This drink was given to you during your hospital  pre-op appointment visit.  Nothing else to drink after completing the  12 oz bottle of G2.         If you have questions, please contact your surgeons office.     Take these medicines the morning of surgery with A SIP OF WATER  buPROPion (WELLBUTRIN XL) cetirizine (ZYRTEC)  metoprolol succinate (TOPROL-XL)  omeprazole (PRILOSEC) sertraline (ZOLOFT) SYNTHROID    If needed: acetaminophen (TYLENOL) Meclizine HCl ondansetron (ZOFRAN ODT)  As of today, STOP taking any Aspirin (unless otherwise instructed by your surgeon) Aleve, Naproxen, Ibuprofen, Motrin, Advil, Goody's, BC's, all herbal medications, fish oil, and all vitamins. This includes your diclofenac (VOLTAREN).          WHAT DO I DO ABOUT MY DIABETES MEDICATION?   Do not take metFORMIN (GLUCOPHAGE-XR)  the morning of surgery.     HOW TO MANAGE YOUR DIABETES BEFORE AND AFTER SURGERY  Why  is it important to control my blood sugar before and after surgery? Improving blood sugar levels before and after surgery helps healing and can limit problems. A way of improving blood sugar control is eating a healthy diet by:  Eating less sugar and carbohydrates  Increasing activity/exercise  Talking with your doctor about reaching your blood sugar goals High blood sugars (greater than 180 mg/dL) can raise your risk of infections and slow your recovery, so you will need to focus on controlling your diabetes during the weeks before surgery. Make sure that the doctor who takes care of your diabetes knows about your planned surgery including the date and location.  How do I manage my blood sugar before surgery? Check your blood sugar at least 4 times a day, starting 2 days before surgery, to make sure that the level is not too high or low.  Check your blood sugar the morning of your surgery when you wake up and every 2 hours until you get to the Short Stay unit.  If your blood sugar is less than 70 mg/dL, you will need to treat for low blood sugar: Do not take insulin. Treat a low blood sugar (less than 70 mg/dL) with  cup of clear juice (cranberry or apple), 4 glucose tablets, OR glucose gel. Recheck blood sugar in 15 minutes after treatment (to make sure it is greater than 70 mg/dL). If your blood sugar is not greater than 70 mg/dL on recheck, call [phone] for  further instructions. Report your blood sugar to the short stay nurse when you get to Short Stay.  If you are admitted to the hospital after surgery: Your blood sugar will be checked by the staff and you will probably be given insulin after surgery (instead of oral diabetes medicines) to make sure you have good blood sugar levels. The goal for blood sugar control after surgery is 80-180 mg/dL.              Do NOT Smoke (Tobacco/Vaping) for 24 hours prior to your procedure.  If you use a CPAP at night, you may bring your  mask/headgear for your overnight stay.   Contacts, glasses, piercing's, hearing aid's, dentures or partials may not be worn into surgery, please bring cases for these belongings.    For patients admitted to the hospital, discharge time will be determined by your treatment team.   Patients discharged the day of surgery will not be allowed to drive home, and someone needs to stay with them for 24 hours.  NO VISITORS WILL BE ALLOWED IN PRE-OP WHERE PATIENTS ARE PREPPED FOR SURGERY.  ONLY 1 SUPPORT PERSON MAY BE PRESENT IN THE WAITING ROOM WHILE YOU ARE IN SURGERY.  IF YOU ARE TO BE ADMITTED, ONCE YOU ARE IN YOUR ROOM YOU WILL BE ALLOWED TWO (2) VISITORS. (1) VISITOR MAY STAY OVERNIGHT BUT MUST ARRIVE TO THE ROOM BY 8pm.  Minor children may have two parents present. Special consideration for safety and communication needs will be reviewed on a case by case basis.   Special instructions:   [place]- Preparing For Surgery  Before surgery, you can play an important role. Because skin is not sterile, your skin needs to be as free of germs as possible. You can reduce the number of germs on your skin by washing with CHG (chlorahexidine gluconate) Soap before surgery.  CHG is an antiseptic cleaner which kills germs and bonds with the skin to continue killing germs even after washing.    Oral Hygiene is also important to reduce your risk of infection.  Remember - BRUSH YOUR TEETH THE MORNING OF SURGERY WITH YOUR REGULAR TOOTHPASTE  Please do not use if you have an allergy to CHG or antibacterial soaps. If your skin becomes reddened/irritated stop using the CHG.  Do not shave (including legs and underarms) for at least 48 hours prior to first CHG shower. It is OK to shave your face.  Please follow these instructions carefully.   Shower the NIGHT BEFORE SURGERY and the MORNING OF SURGERY  If you chose to wash your hair, wash your hair first as usual with your normal shampoo.  After you shampoo, rinse  your hair and body thoroughly to remove the shampoo.  Use CHG Soap as you would any other liquid soap. You can apply CHG directly to the skin and wash gently with a scrungie or a clean washcloth.   Apply the CHG Soap to your body ONLY FROM THE NECK DOWN.  Do not use on open wounds or open sores. Avoid contact with your eyes, ears, mouth and genitals (private parts). Wash Face and genitals (private parts)  with your normal soap.   Wash thoroughly, paying special attention to the area where your surgery will be performed.  Thoroughly rinse your body with warm water from the neck down.  DO NOT shower/wash with your normal soap after using and rinsing off the CHG Soap.  Pat yourself dry with a CLEAN TOWEL.  [place]  to bed the night before surgery  Place CLEAN SHEETS on your bed the night before your surgery  DO NOT SLEEP WITH PETS.   Day of Surgery: Shower with CHG soap. Do not wear jewelry, make up, nail polish, gel polish, artificial nails, or any other type of covering on natural nails including finger and toenails. If patients have artificial nails, gel coating, etc. that need to be removed by a nail salon please have this removed prior to surgery. Surgery may need to be canceled/delayed if the surgeon/anesthesiologist feels like the patient is unable to be adequately monitored. Do not wear lotions, powders, perfumes, or deodorant. Do not shave 48 hours prior to surgery.   Do not bring valuables to the hospital. [facility] is not responsible for any belongings or valuables. Wear Clean/Comfortable clothing the morning of surgery Remember to brush your teeth WITH YOUR REGULAR TOOTHPASTE.   Please read over the following fact sheets that you were given.   3 days prior to your procedure or After your COVID test   You are not required to quarantine however you are required to wear a well-fitting mask when you are out and around people not in your household. If your mask  becomes wet or soiled, replace with a new one.   Wash your hands often with soap and water for 20 seconds or clean your hands with an alcohol-based hand sanitizer that contains at least 60% alcohol.   Do not share personal items.   Notify your provider:  o if you are in close contact with someone who has COVID  o or if you develop a fever of 100.4 or greater, sneezing, cough, sore throat, shortness of breath or body aches.

## 2021-03-22 ENCOUNTER — Encounter (HOSPITAL_COMMUNITY): Payer: Self-pay

## 2021-03-22 ENCOUNTER — Other Ambulatory Visit: Payer: Self-pay

## 2021-03-22 ENCOUNTER — Encounter (HOSPITAL_COMMUNITY)
Admission: RE | Admit: 2021-03-22 | Discharge: 2021-03-22 | Disposition: A | Discharge status: Home / Self Care | Payer: Medicare Other | Source: Ambulatory Visit | Attending: Orthopedic Surgery | Admitting: Orthopedic Surgery

## 2021-03-22 VITALS — BP 103/81 | HR 88 | Temp 97.5°F | Resp 18 | Ht 62.0 in | Wt 141.3 lb

## 2021-03-22 DIAGNOSIS — Z01812 Encounter for preprocedural laboratory examination: Secondary | ICD-10-CM | POA: Insufficient documentation

## 2021-03-22 DIAGNOSIS — Z01818 Encounter for other preprocedural examination: Secondary | ICD-10-CM

## 2021-03-22 DIAGNOSIS — E119 Type 2 diabetes mellitus without complications: Secondary | ICD-10-CM | POA: Diagnosis not present

## 2021-03-22 LAB — TYPE AND SCREEN
ABO/RH(D): O POS
Antibody Screen: NEGATIVE

## 2021-03-22 LAB — HEMOGLOBIN A1C
Hgb A1c MFr Bld: 6 % — ABNORMAL HIGH (ref 4.8–5.6)
Mean Plasma Glucose: 125.5 mg/dL

## 2021-03-22 LAB — GLUCOSE, CAPILLARY: Glucose-Capillary: 130 mg/dL — ABNORMAL HIGH (ref 70–99)

## 2021-03-22 LAB — SURGICAL PCR SCREEN
MRSA, PCR: NEGATIVE
Staphylococcus aureus: NEGATIVE

## 2021-03-22 NOTE — Progress Notes (Signed)
PCP - Dr. Iona Beard Cardiologist - denies  PPM/ICD - denies   Chest x-ray - 06/05/17 EKG - 07/17/20 Stress Test - 06/12/17 ECHO - 06/08/17 Cardiac Cath - denies  Sleep Study - yes 5+ years ago, OSA+ CPAP - no, non-compliant  DM- Type 2 Pt does not check CBG at home  Blood Thinner Instructions: n/a Aspirin Instructions: n/a  ERAS Protcol - yes PRE-SURGERY G2- given at PAT  COVID TEST- n/a, ambulatory surgery   Anesthesia review: no  Patient denies shortness of breath, fever, cough and chest pain at PAT appointment   All instructions explained to the patient, with a verbal understanding of the material. Patient agrees to go over the instructions while at home for a better understanding. Patient also instructed to wear a mask in public for 3 days prior to surgery. The opportunity to ask questions was provided.

## 2021-03-25 ENCOUNTER — Ambulatory Visit (HOSPITAL_COMMUNITY): Payer: Medicare Other | Admitting: Anesthesiology

## 2021-03-25 ENCOUNTER — Encounter (HOSPITAL_COMMUNITY): Payer: Self-pay | Admitting: Orthopedic Surgery

## 2021-03-25 ENCOUNTER — Other Ambulatory Visit: Payer: Self-pay

## 2021-03-25 ENCOUNTER — Ambulatory Visit (HOSPITAL_COMMUNITY): Payer: Medicare Other

## 2021-03-25 ENCOUNTER — Ambulatory Visit (HOSPITAL_COMMUNITY)
Admission: RE | Admit: 2021-03-25 | Discharge: 2021-03-25 | Disposition: A | Discharge status: Home / Self Care | Payer: Medicare Other | Attending: Orthopedic Surgery | Admitting: Orthopedic Surgery

## 2021-03-25 ENCOUNTER — Encounter (HOSPITAL_COMMUNITY)
Admission: RE | Disposition: A | Discharge status: Home / Self Care | Payer: Self-pay | Source: Home / Self Care | Attending: Orthopedic Surgery

## 2021-03-25 DIAGNOSIS — G9529 Other cord compression: Secondary | ICD-10-CM | POA: Insufficient documentation

## 2021-03-25 DIAGNOSIS — Z6825 Body mass index (BMI) 25.0-25.9, adult: Secondary | ICD-10-CM | POA: Insufficient documentation

## 2021-03-25 DIAGNOSIS — Z7984 Long term (current) use of oral hypoglycemic drugs: Secondary | ICD-10-CM | POA: Insufficient documentation

## 2021-03-25 DIAGNOSIS — E669 Obesity, unspecified: Secondary | ICD-10-CM | POA: Diagnosis not present

## 2021-03-25 DIAGNOSIS — G959 Disease of spinal cord, unspecified: Secondary | ICD-10-CM | POA: Diagnosis not present

## 2021-03-25 DIAGNOSIS — G9589 Other specified diseases of spinal cord: Secondary | ICD-10-CM | POA: Insufficient documentation

## 2021-03-25 DIAGNOSIS — J449 Chronic obstructive pulmonary disease, unspecified: Secondary | ICD-10-CM | POA: Insufficient documentation

## 2021-03-25 DIAGNOSIS — I10 Essential (primary) hypertension: Secondary | ICD-10-CM | POA: Insufficient documentation

## 2021-03-25 DIAGNOSIS — K219 Gastro-esophageal reflux disease without esophagitis: Secondary | ICD-10-CM | POA: Diagnosis not present

## 2021-03-25 DIAGNOSIS — G4730 Sleep apnea, unspecified: Secondary | ICD-10-CM | POA: Insufficient documentation

## 2021-03-25 DIAGNOSIS — E119 Type 2 diabetes mellitus without complications: Secondary | ICD-10-CM | POA: Diagnosis not present

## 2021-03-25 DIAGNOSIS — Z981 Arthrodesis status: Secondary | ICD-10-CM | POA: Diagnosis not present

## 2021-03-25 DIAGNOSIS — M549 Dorsalgia, unspecified: Secondary | ICD-10-CM | POA: Insufficient documentation

## 2021-03-25 DIAGNOSIS — M5002 Cervical disc disorder with myelopathy, mid-cervical region, unspecified level: Secondary | ICD-10-CM | POA: Diagnosis not present

## 2021-03-25 DIAGNOSIS — Z87891 Personal history of nicotine dependence: Secondary | ICD-10-CM | POA: Insufficient documentation

## 2021-03-25 DIAGNOSIS — G8929 Other chronic pain: Secondary | ICD-10-CM | POA: Diagnosis not present

## 2021-03-25 DIAGNOSIS — Z419 Encounter for procedure for purposes other than remedying health state, unspecified: Secondary | ICD-10-CM

## 2021-03-25 HISTORY — PX: ANTERIOR CERVICAL DECOMP/DISCECTOMY FUSION: SHX1161

## 2021-03-25 LAB — GLUCOSE, CAPILLARY: Glucose-Capillary: 108 mg/dL — ABNORMAL HIGH (ref 70–99)

## 2021-03-25 IMAGING — RF DG CERVICAL SPINE 1V
1 series · 1 of 1 positions shown · non-contrast
Comparison: None.

CLINICAL DATA: C3-C5 ACDF.

EXAM:
DG CERVICAL SPINE - 1 VIEW

[Series 1: run · 1 of 1 slices shown]
[im 1/1]
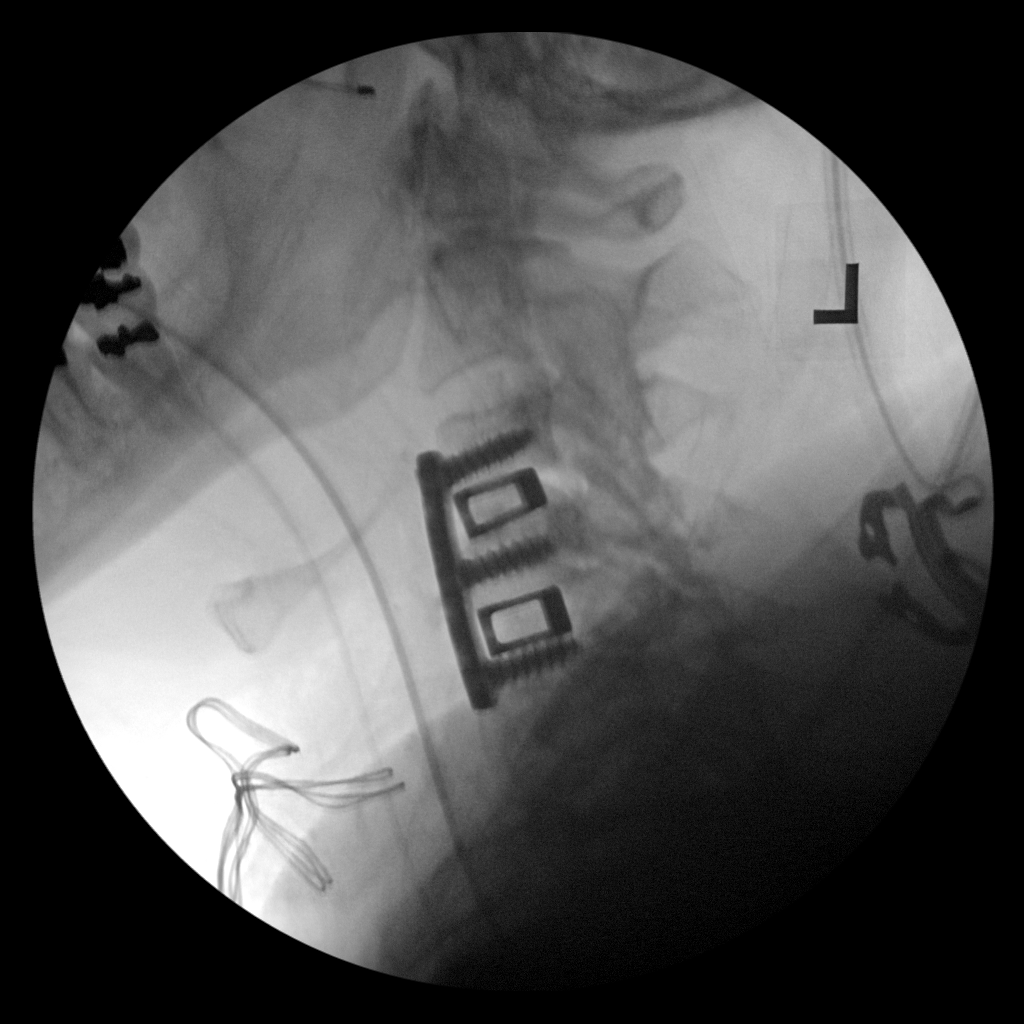

[1 of 1 positions shown; findings below may reference images not displayed]

FINDINGS: Fluoroscopic images were obtained intraoperatively and submitted for
post operative interpretation. Cervical ACDF of C3-C5 with hardware
in expected position, 2 images were obtained with 15 seconds of
fluoroscopy time and 1.72 mGy. Please see the performing provider's
procedural report for further detail.
IMPRESSION: As above.

## 2021-03-25 SURGERY — ANTERIOR CERVICAL DECOMPRESSION/DISCECTOMY FUSION 2 LEVELS
Anesthesia: General | Site: Neck

## 2021-03-25 MED ORDER — HYDROMORPHONE HCL 1 MG/ML IJ SOLN
INTRAMUSCULAR | Status: AC
  Filled 2021-03-25: qty 0.5

## 2021-03-25 MED ORDER — LIDOCAINE 2% (20 MG/ML) 5 ML SYRINGE
INTRAMUSCULAR | Status: DC | PRN
  Administered 2021-03-25: 100 mg via INTRAVENOUS

## 2021-03-25 MED ORDER — OXYCODONE HCL 5 MG/5ML PO SOLN: 5.0000 mg | Freq: Once | ORAL | Status: DC | PRN

## 2021-03-25 MED ORDER — FENTANYL CITRATE (PF) 100 MCG/2ML IJ SOLN
25.0000 ug | INTRAMUSCULAR | Status: DC | PRN
  Administered 2021-03-25: 50 ug via INTRAVENOUS

## 2021-03-25 MED ORDER — OXYCODONE HCL 5 MG PO TABS: 5.0000 mg | ORAL_TABLET | Freq: Once | ORAL | Status: DC | PRN

## 2021-03-25 MED ORDER — BUPIVACAINE-EPINEPHRINE 0.25% -1:200000 IJ SOLN
INTRAMUSCULAR | Status: DC | PRN
Start: 2021-03-25 — End: 2021-03-25
  Administered 2021-03-25: 10 mL

## 2021-03-25 MED ORDER — ACETAMINOPHEN 160 MG/5ML PO SOLN: 325.0000 mg | ORAL | Status: DC | PRN

## 2021-03-25 MED ORDER — KETAMINE HCL 10 MG/ML IJ SOLN
INTRAMUSCULAR | Status: DC | PRN
Start: 2021-03-25 — End: 2021-03-25
  Administered 2021-03-25: 20 mg via INTRAVENOUS
  Administered 2021-03-25: 10 mg via INTRAVENOUS

## 2021-03-25 MED ORDER — SUGAMMADEX SODIUM 200 MG/2ML IV SOLN
INTRAVENOUS | Status: DC | PRN
Start: 2021-03-25 — End: 2021-03-25
  Administered 2021-03-25: 400 mg via INTRAVENOUS

## 2021-03-25 MED ORDER — PROPOFOL 10 MG/ML IV BOLUS
INTRAVENOUS | Status: AC
  Filled 2021-03-25: qty 20

## 2021-03-25 MED ORDER — LACTATED RINGERS IV SOLN: INTRAVENOUS | Status: DC

## 2021-03-25 MED ORDER — ONDANSETRON HCL 4 MG/2ML IJ SOLN
INTRAMUSCULAR | Status: AC
  Filled 2021-03-25: qty 4

## 2021-03-25 MED ORDER — HYDROCODONE-ACETAMINOPHEN 5-325 MG PO TABS: 1.0000 | ORAL_TABLET | Freq: Four times a day (QID) | ORAL | 0 refills | Status: AC | PRN

## 2021-03-25 MED ORDER — ORAL CARE MOUTH RINSE: 15.0000 mL | Freq: Once | OROMUCOSAL | Status: AC

## 2021-03-25 MED ORDER — CEFAZOLIN SODIUM-DEXTROSE 2-4 GM/100ML-% IV SOLN
INTRAVENOUS | Status: AC
  Filled 2021-03-25: qty 100

## 2021-03-25 MED ORDER — METHOCARBAMOL 500 MG PO TABS
500.0000 mg | ORAL_TABLET | Freq: Four times a day (QID) | ORAL | 0 refills | Status: AC | PRN
Start: 2021-03-25 — End: ?

## 2021-03-25 MED ORDER — 0.9 % SODIUM CHLORIDE (POUR BTL) OPTIME
TOPICAL | Status: DC | PRN
  Administered 2021-03-25 (×2): 1000 mL

## 2021-03-25 MED ORDER — BUPIVACAINE-EPINEPHRINE (PF) 0.25% -1:200000 IJ SOLN
INTRAMUSCULAR | Status: AC
  Filled 2021-03-25: qty 30

## 2021-03-25 MED ORDER — CHLORHEXIDINE GLUCONATE 0.12 % MT SOLN
15.0000 mL | Freq: Once | OROMUCOSAL | Status: AC
  Administered 2021-03-25: 15 mL via OROMUCOSAL

## 2021-03-25 MED ORDER — MEPERIDINE HCL 25 MG/ML IJ SOLN: 6.2500 mg | INTRAMUSCULAR | Status: DC | PRN

## 2021-03-25 MED ORDER — ONDANSETRON HCL 4 MG/2ML IJ SOLN
INTRAMUSCULAR | Status: DC | PRN
  Administered 2021-03-25: 4 mg via INTRAVENOUS

## 2021-03-25 MED ORDER — ONDANSETRON HCL 4 MG/2ML IJ SOLN: 4.0000 mg | Freq: Once | INTRAMUSCULAR | Status: DC | PRN

## 2021-03-25 MED ORDER — CHLORHEXIDINE GLUCONATE 0.12 % MT SOLN
OROMUCOSAL | Status: AC
  Filled 2021-03-25: qty 15

## 2021-03-25 MED ORDER — POVIDONE-IODINE 7.5 % EX SOLN
Freq: Once | CUTANEOUS | Status: DC
  Filled 2021-03-25: qty 118

## 2021-03-25 MED ORDER — FENTANYL CITRATE (PF) 100 MCG/2ML IJ SOLN
INTRAMUSCULAR | Status: AC
  Filled 2021-03-25: qty 2

## 2021-03-25 MED ORDER — THROMBIN 20000 UNITS EX SOLR: CUTANEOUS | Status: DC | PRN

## 2021-03-25 MED ORDER — KETAMINE HCL 50 MG/5ML IJ SOSY
PREFILLED_SYRINGE | INTRAMUSCULAR | Status: AC
  Filled 2021-03-25: qty 5

## 2021-03-25 MED ORDER — PROPOFOL 10 MG/ML IV BOLUS
INTRAVENOUS | Status: DC | PRN
  Administered 2021-03-25: 150 mg via INTRAVENOUS

## 2021-03-25 MED ORDER — MIDAZOLAM HCL 2 MG/2ML IJ SOLN
INTRAMUSCULAR | Status: AC
  Filled 2021-03-25: qty 2

## 2021-03-25 MED ORDER — FENTANYL CITRATE (PF) 100 MCG/2ML IJ SOLN
INTRAMUSCULAR | Status: DC | PRN
  Administered 2021-03-25: 75 ug via INTRAVENOUS

## 2021-03-25 MED ORDER — HYDROMORPHONE HCL 1 MG/ML IJ SOLN
INTRAMUSCULAR | Status: DC | PRN
  Administered 2021-03-25: .5 mg via INTRAVENOUS

## 2021-03-25 MED ORDER — FENTANYL CITRATE (PF) 250 MCG/5ML IJ SOLN
INTRAMUSCULAR | Status: AC
  Filled 2021-03-25: qty 5

## 2021-03-25 MED ORDER — THROMBIN 20000 UNITS EX SOLR
CUTANEOUS | Status: AC
  Filled 2021-03-25: qty 20000

## 2021-03-25 MED ORDER — ACETAMINOPHEN 325 MG PO TABS: 325.0000 mg | ORAL_TABLET | ORAL | Status: DC | PRN

## 2021-03-25 MED ORDER — ROCURONIUM BROMIDE 10 MG/ML (PF) SYRINGE
PREFILLED_SYRINGE | INTRAVENOUS | Status: DC | PRN
  Administered 2021-03-25 (×2): 50 mg via INTRAVENOUS

## 2021-03-25 MED ORDER — CEFAZOLIN SODIUM-DEXTROSE 2-4 GM/100ML-% IV SOLN
2.0000 g | INTRAVENOUS | Status: AC
  Administered 2021-03-25: 2 g via INTRAVENOUS

## 2021-03-25 SURGICAL SUPPLY — 83 items
AGENT HMST KT MTR STRL THRMB (HEMOSTASIS)
APL SKNCLS STERI-STRIP NONHPOA (GAUZE/BANDAGES/DRESSINGS) ×1
BAG COUNTER SPONGE SURGICOUNT (BAG) ×2 IMPLANT
BAG SPNG CNTER NS LX DISP (BAG) ×1
BENZOIN TINCTURE PRP APPL 2/3 (GAUZE/BANDAGES/DRESSINGS) ×2 IMPLANT
BIT DRILL NEURO 2X3.1 SFT TUCH (MISCELLANEOUS) ×1 IMPLANT
BIT DRILL SRG 14X2.2XFLT CHK (BIT) IMPLANT
BIT DRL SRG 14X2.2XFLT CHK (BIT) ×1
BLADE CLIPPER SURG (BLADE) ×2 IMPLANT
BLADE SURG 15 STRL LF DISP TIS (BLADE) ×1 IMPLANT
BLADE SURG 15 STRL SS (BLADE) ×2
BONE VIVIGEN FORMABLE 1.3CC (Bone Implant) ×2 IMPLANT
CARTRIDGE OIL MAESTRO DRILL (MISCELLANEOUS) ×1 IMPLANT
COLLAR CERV LO CONTOUR FIRM DE (SOFTGOODS) IMPLANT
CORD BIPOLAR FORCEPS 12FT (ELECTRODE) ×1 IMPLANT
COVER SURGICAL LIGHT HANDLE (MISCELLANEOUS) ×1 IMPLANT
DECANTER SPIKE VIAL GLASS SM (MISCELLANEOUS) ×1 IMPLANT
DIFFUSER DRILL AIR PNEUMATIC (MISCELLANEOUS) ×2 IMPLANT
DRAIN JACKSON RD 7FR 3/32 (WOUND CARE) IMPLANT
DRAPE C-ARM 42X72 X-RAY (DRAPES) ×2 IMPLANT
DRAPE POUCH INSTRU U-SHP 10X18 (DRAPES) ×2 IMPLANT
DRAPE SURG 17X23 STRL (DRAPES) ×4 IMPLANT
DRILL BIT SKYLINE 14MM (BIT) ×2
DRILL NEURO 2X3.1 SOFT TOUCH (MISCELLANEOUS) ×2
DURAPREP 26ML APPLICATOR (WOUND CARE) ×2 IMPLANT
ELECT COATED BLADE 2.86 ST (ELECTRODE) ×2 IMPLANT
ELECT REM PT RETURN 9FT ADLT (ELECTROSURGICAL) ×2
ELECTRODE REM PT RTRN 9FT ADLT (ELECTROSURGICAL) ×1 IMPLANT
EVACUATOR SILICONE 100CC (DRAIN) IMPLANT
GAUZE 4X4 16PLY ~~LOC~~+RFID DBL (SPONGE) ×2 IMPLANT
GAUZE SPONGE 4X4 12PLY STRL (GAUZE/BANDAGES/DRESSINGS) ×2 IMPLANT
GLOVE SRG 8 PF TXTR STRL LF DI (GLOVE) ×1 IMPLANT
GLOVE SURG ENC MOIS LTX SZ6.5 (GLOVE) ×2 IMPLANT
GLOVE SURG ENC MOIS LTX SZ8 (GLOVE) ×1 IMPLANT
GLOVE SURG POLYISO LF SZ6.5 (GLOVE) ×5 IMPLANT
GLOVE SURG POLYISO LF SZ8 (GLOVE) ×2 IMPLANT
GLOVE SURG UNDER POLY LF SZ6.5 (GLOVE) ×1 IMPLANT
GLOVE SURG UNDER POLY LF SZ7 (GLOVE) ×4 IMPLANT
GLOVE SURG UNDER POLY LF SZ8 (GLOVE) ×2
GOWN STRL REUS W/ TWL LRG LVL3 (GOWN DISPOSABLE) ×1 IMPLANT
GOWN STRL REUS W/ TWL XL LVL3 (GOWN DISPOSABLE) ×1 IMPLANT
GOWN STRL REUS W/TWL LRG LVL3 (GOWN DISPOSABLE) ×2
GOWN STRL REUS W/TWL XL LVL3 (GOWN DISPOSABLE) ×2
GRAFT BNE MATRIX VG FRMBL SM 1 (Bone Implant) IMPLANT
IMPL S ENDOSKEL TC 7 ODEG (Orthopedic Implant) IMPLANT
IMPLANT S ENDOSKEL TC 7 ODEG (Orthopedic Implant) ×2 IMPLANT
INTERLOCK LRDTC CRVCL VBR 7MM (Bone Implant) IMPLANT
IV CATH 14GX2 1/4 (CATHETERS) ×1 IMPLANT
KIT BASIN OR (CUSTOM PROCEDURE TRAY) ×2 IMPLANT
KIT TURNOVER KIT B (KITS) ×2 IMPLANT
LORDOTIC CERVICAL VBR 7MM SM (Bone Implant) ×2 IMPLANT
MANIFOLD NEPTUNE II (INSTRUMENTS) ×1 IMPLANT
NDL PRECISIONGLIDE 27X1.5 (NEEDLE) ×1 IMPLANT
NDL SPNL 20GX3.5 QUINCKE YW (NEEDLE) ×1 IMPLANT
NEEDLE PRECISIONGLIDE 27X1.5 (NEEDLE) ×2 IMPLANT
NEEDLE SPNL 20GX3.5 QUINCKE YW (NEEDLE) ×2 IMPLANT
NS IRRIG 1000ML POUR BTL (IV SOLUTION) ×3 IMPLANT
OIL CARTRIDGE MAESTRO DRILL (MISCELLANEOUS) ×2
PACK ORTHO CERVICAL (CUSTOM PROCEDURE TRAY) ×2 IMPLANT
PAD ARMBOARD 7.5X6 YLW CONV (MISCELLANEOUS) ×4 IMPLANT
PATTIES SURGICAL .5 X.5 (GAUZE/BANDAGES/DRESSINGS) IMPLANT
PATTIES SURGICAL .5 X1 (DISPOSABLE) ×1 IMPLANT
PIN DISTRACTION 14 (PIN) ×2 IMPLANT
PLATE SKYLINE TWO LEVEL 28MM (Plate) ×1 IMPLANT
POSITIONER HEAD DONUT 9IN (MISCELLANEOUS) ×2 IMPLANT
SCREW SKYLINE VAR OS 14MM (Screw) ×6 IMPLANT
SPONGE INTESTINAL PEANUT (DISPOSABLE) ×4 IMPLANT
SPONGE SURGIFOAM ABS GEL 100 (HEMOSTASIS) ×2 IMPLANT
SPONGE T-LAP 18X18 ~~LOC~~+RFID (SPONGE) ×1 IMPLANT
STRIP CLOSURE SKIN 1/2X4 (GAUZE/BANDAGES/DRESSINGS) ×2 IMPLANT
SURGIFLO W/THROMBIN 8M KIT (HEMOSTASIS) IMPLANT
SUT MNCRL AB 4-0 PS2 18 (SUTURE) ×2 IMPLANT
SUT SILK 4 0 (SUTURE)
SUT SILK 4-0 18XBRD TIE 12 (SUTURE) IMPLANT
SUT VIC AB 2-0 CT2 18 VCP726D (SUTURE) ×2 IMPLANT
SYR BULB IRRIG 60ML STRL (SYRINGE) ×2 IMPLANT
SYR CONTROL 10ML LL (SYRINGE) ×6 IMPLANT
TAPE CLOTH 4X10 WHT NS (GAUZE/BANDAGES/DRESSINGS) ×1 IMPLANT
TAPE UMBILICAL COTTON 1/8X30 (MISCELLANEOUS) ×2 IMPLANT
TOWEL GREEN STERILE (TOWEL DISPOSABLE) ×2 IMPLANT
TOWEL GREEN STERILE FF (TOWEL DISPOSABLE) ×2 IMPLANT
WATER STERILE IRR 1000ML POUR (IV SOLUTION) ×2 IMPLANT
YANKAUER SUCT BULB TIP NO VENT (SUCTIONS) ×2 IMPLANT

## 2021-03-25 NOTE — H&P (Signed)
PREOPERATIVE H&P  Chief Complaint: Progressive upper and lower extremity weakness, numbness, and tingling  HPI: Karla Baker is a 61 y.o. female who presents with ongoing pain in the neurologic symptoms as noted above  MRI reveals spinal cord compression and neuroforaminal stenosis involving C3-4 and C4-5  Patient has failed multiple forms of conservative care and continues to have pain (see office notes for additional details regarding the patient's full course of treatment)  Past Medical History:  Diagnosis Date   Arthritis    "knees and hands" (07/19/2012)   Boil, back    "popped it 07/08/2012 & it got infected" (07/19/2012)   Chronic lower back pain    Depression    Essential hypertension 05/16/2018   GERD (gastroesophageal reflux disease)    High cholesterol    "don't think I take the RX anymore" (07/19/2012)   Hypertension    Hyperthyroidism    "overactive; never put me on anything for it" (07/19/2012)   Migraines    "only when I took BCP" (07/19/2012)   Palpitations 05/16/2018   Pneumonia    "a couple times" (07/19/2012)   Sleep apnea    does not wear CPAP   Type II diabetes mellitus ([place])    Past Surgical History:  Procedure Laterality Date   BREAST BIOPSY Left    u/s guided core biopsy   BUNIONECTOMY Bilateral    many years ago   CESAREAN SECTION  09/22/1980   COLONOSCOPY     ESOPHAGOGASTRODUODENOSCOPY     INCISION AND DRAINAGE ABSCESS N/A 07/20/2012   Procedure: INCISION AND DRAINAGE ABSCESS;  Surgeon: Ralene Ok, MD;  Location: [facility] OR;  Service: General;  Laterality: N/A;   KNEE ARTHROSCOPY W/ MENISCAL REPAIR Left 2001   LAPAROSCOPY FOR ECTOPIC PREGNANCY  ~ 1987   left shoulder bone "scraping" Left 2017   REPLACEMENT TOTAL KNEE Left 2009   ruptured disc     TONSILLECTOMY     removed as a child   TUBAL LIGATION  ~ 1987   Social History   Socioeconomic History   Marital status: Married    Spouse name: Not on file   Number of children: 1    Years of education: Not on file   Highest education level: Not on file  Occupational History   Not on file  Tobacco Use   Smoking status: Former    Packs/day: 1.00    Years: 35.00    Pack years: 35.00    Types: Cigarettes    Quit date: 2013    Years since quitting: 10.0   Smokeless tobacco: Never  Vaping Use   Vaping Use: Never used  Substance and Sexual Activity   Alcohol use: Not Currently   Drug use: Yes    Types: Marijuana    Comment: very occasionally   Sexual activity: Yes  Other Topics Concern   Not on file  Social History Narrative   Not on file   Social Determinants of Health   Financial Resource Strain: Not on file  Food Insecurity: Not on file  Transportation Needs: Not on file  Physical Activity: Not on file  Stress: Not on file  Social Connections: Not on file   Family History  Problem Relation Age of Onset   Congestive Heart Failure Mother    Cancer Father        unknown to pt - smoking related but not lung/throat   Allergies  Allergen Reactions   Latex Rash   Prior to Admission  medications   Medication Sig Start Date End Date Taking? Authorizing Provider  acetaminophen (TYLENOL) 650 MG CR tablet Take 1,300 mg by mouth every 8 (eight) hours as needed for pain.   Yes [provider]  Ascorbic Acid (VITAMIN C) 1000 MG tablet Take 1,000 mg by mouth daily.   Yes [provider]  buPROPion (WELLBUTRIN XL) 150 MG 24 hr tablet Take 150 mg by mouth daily.    Yes [provider]  cetaphil (CETAPHIL) lotion Apply 1 application topically as needed for dry skin.   Yes [provider]  cetirizine (ZYRTEC) 10 MG tablet Take 10 mg by mouth daily.   Yes [provider]  cholecalciferol (VITAMIN D) 25 MCG (1000 UNIT) tablet Take 1,000 Units by mouth every evening.   Yes [provider]  diclofenac (VOLTAREN) 75 MG EC tablet Take 75 mg by mouth 2 (two) times daily.  04/03/18  Yes [provider]   Meclizine HCl 25 MG CHEW Chew 25 mg by mouth daily as needed for dizziness. 10/09/20  Yes [provider]  metFORMIN (GLUCOPHAGE-XR) 500 MG 24 hr tablet Take 500 mg by mouth 2 (two) times daily.   Yes [provider]  metoprolol succinate (TOPROL-XL) 25 MG 24 hr tablet Take 12.5 mg by mouth daily. 08/05/19  Yes [provider]  omeprazole (PRILOSEC) 20 MG capsule Take 20 mg by mouth daily before breakfast. 30 minutes prior to first meal of the day   Yes [provider]  ondansetron (ZOFRAN ODT) 8 MG disintegrating tablet Take 1 tablet (8 mg total) by mouth every 8 (eight) hours as needed for nausea. 07/14/20  Yes Nanavati, Ankit, MD  oxybutynin (DITROPAN-XL) 10 MG 24 hr tablet Take 10 mg by mouth every evening.   Yes [provider]  prazosin (MINIPRESS) 1 MG capsule Take 1 mg by mouth at bedtime.    Yes [provider]  sertraline (ZOLOFT) 100 MG tablet Take 50 mg by mouth daily. 07/28/20  Yes [provider]  simvastatin (ZOCOR) 80 MG tablet Take 40 mg by mouth every evening.   Yes [provider]  SYNTHROID 50 MCG tablet Take 50 mcg by mouth daily. 07/24/19  Yes [provider]     All other systems have been reviewed and were otherwise negative with the exception of those mentioned in the HPI and as above.  Physical Exam: There were no vitals filed for this visit.  There is no height or weight on file to calculate BMI.  General: Alert, no acute distress Cardiovascular: No pedal edema Respiratory: No cyanosis, no use of accessory musculature Skin: No lesions in the area of chief complaint Neurologic: Sensation intact distally Psychiatric: Patient is competent for consent with normal mood and affect Lymphatic: No axillary or cervical lymphadenopathy   Assessment/Plan: Progressive cervical myelopathy, with spinal cord compression noted at C3-4 and C4-5. Plan for Procedure(s): [place], MD 03/25/2021 6:28 AM

## 2021-03-25 NOTE — Anesthesia Preprocedure Evaluation (Addendum)
Anesthesia Evaluation  Patient identified by MRN, date of birth, ID band Patient awake    Reviewed: Allergy & Precautions, NPO status , Patient's Chart, lab work & pertinent test results  History of Anesthesia Complications Negative for: history of anesthetic complications  Airway Mallampati: I  TM Distance: >3 FB Neck ROM: Limited    Dental  (+) Dental Advisory Given, Poor Dentition, Chipped, Missing   Pulmonary sleep apnea and Continuous Positive Airway Pressure Ventilation , COPD, former smoker,    breath sounds clear to auscultation       Cardiovascular hypertension, Pt. on medications (-) angina Rhythm:Regular Rate:Normal  4/19 Stress: normal myocardial thickening and wall motion.  LVEF normal 57%.      Neuro/Psych  Headaches, PSYCHIATRIC DISORDERS Depression Chronic back pain: narcotics    GI/Hepatic Neg liver ROS, GERD  Controlled and Medicated,  Endo/Other  diabetes, Type 2, Oral Hypoglycemic AgentsHyperthyroidism   Renal/GU negative Renal ROS     Musculoskeletal  (+) Arthritis , Osteoarthritis,    Abdominal (+) + obese,   Peds  Hematology negative hematology ROS (+)   Anesthesia Other Findings   Reproductive/Obstetrics                            Anesthesia Physical  Anesthesia Plan  ASA: 3  Anesthesia Plan: General   Post-op Pain Management:    Induction: Intravenous  PONV Risk Score and Plan: 4 or greater and Ondansetron and Dexamethasone  Airway Management Planned: Oral ETT and Video Laryngoscope Planned  Additional Equipment: None  Intra-op Plan:   Post-operative Plan: Extubation in OR  Informed Consent: I have reviewed the patients History and Physical, chart, labs and discussed the procedure including the risks, benefits and alternatives for the proposed anesthesia with the patient or authorized representative who has indicated his/her understanding and  acceptance.     Dental advisory given  Plan Discussed with: CRNA, Surgeon and Anesthesiologist  Anesthesia Plan Comments:        Anesthesia Quick Evaluation

## 2021-03-25 NOTE — Anesthesia Procedure Notes (Signed)
Procedure Name: Intubation Date/Time: 03/25/2021 1:07 PM Performed by: Georgia Duff, CRNA Pre-anesthesia Checklist: Patient identified, Emergency Drugs available, Suction available and Patient being monitored Patient Re-evaluated:Patient Re-evaluated prior to induction Oxygen Delivery Method: Circle System Utilized Preoxygenation: Pre-oxygenation with 100% oxygen Induction Type: IV induction Ventilation: Mask ventilation without difficulty Laryngoscope Size: Glidescope and 3 Grade View: Grade I Tube type: Oral Tube size: 7.0 mm Number of attempts: 1 Airway Equipment and Method: Stylet and Oral airway Placement Confirmation: ETT inserted through vocal cords under direct vision, positive ETCO2 and breath sounds checked- equal and bilateral Secured at: 21 cm Tube secured with: Tape Dental Injury: Teeth and Oropharynx as per pre-operative assessment

## 2021-03-25 NOTE — Anesthesia Postprocedure Evaluation (Signed)
Anesthesia Post Note  Patient: Karla Baker  Procedure(s) Performed: CERVICALTHREE- CERVICAL FOUR, CERVICAL FOUR- CERVICAL FIVE ANTERIOR CERVICAL DECOMPRESSION FUSION WITH INSTRUMENTATION AND ALLOGRAFT (Neck)     Patient location during evaluation: PACU Anesthesia Type: General Level of consciousness: awake and alert Pain management: pain level controlled Vital Signs Assessment: post-procedure vital signs reviewed and stable Respiratory status: spontaneous breathing, nonlabored ventilation, respiratory function stable and patient connected to nasal cannula oxygen Cardiovascular status: blood pressure returned to baseline and stable Postop Assessment: no apparent nausea or vomiting Anesthetic complications: no   No notable events documented.  Last Vitals:  Vitals:   03/25/21 1545 03/25/21 1600  BP: (!) 150/83 (!) 151/101  Pulse: 90 82  Resp: 15 14  Temp:  36.6 C  SpO2: 99% 91%    Last Pain:  Vitals:   03/25/21 1600  PainSc: Asleep                 Quadry Kampa

## 2021-03-25 NOTE — Transfer of Care (Signed)
Immediate Anesthesia Transfer of Care Note  Patient: Karla Baker  Procedure(s) Performed: CERVICALTHREE- CERVICAL FOUR, CERVICAL FOUR- CERVICAL FIVE ANTERIOR CERVICAL DECOMPRESSION FUSION WITH INSTRUMENTATION AND ALLOGRAFT (Neck)  Patient Location: PACU  Anesthesia Type:General  Level of Consciousness: drowsy and patient cooperative  Airway & Oxygen Therapy: Patient Spontanous Breathing  Post-op Assessment: Report given to RN and Post -op Vital signs reviewed and stable  Post vital signs: Reviewed and stable  Last Vitals:  Vitals Value Taken Time  BP 139/91 03/25/21 1527  Temp    Pulse 81 03/25/21 1531  Resp 17 03/25/21 1531  SpO2 96 % 03/25/21 1531  Vitals shown include unvalidated device data.  Last Pain:  Vitals:   03/25/21 0927  PainSc: 10-Worst pain ever      Patients Stated Pain Goal: 3 (85/46/27 0350)  Complications: No notable events documented.

## 2021-03-25 NOTE — Op Note (Signed)
PATIENT NAME: Karla Baker [facility]   MEDICAL RECORD NO.:   [id number]    DATE OF BIRTH: [birth year]   DATE OF PROCEDURE: 03/25/2021                                OPERATIVE REPORT     PREOPERATIVE DIAGNOSES: 1. Progressive cervical myelopathy 2. Spinal cord compression spanning C3/4 and C4/5   POSTOPERATIVE DIAGNOSES: 1. Progressive cervical myelopathy 2. Spinal cord compression spanning C3/4 and C4/5   PROCEDURE: 1. Anterior cervical decompression and fusion C3/4, C4/5 2. Placement of anterior instrumentation, C3-C5 3. Insertion of interbody device x 2 (Titan intervertebral spacers). 4. Intraoperative use of fluoroscopy. 5. Use of morselized allograft - ViviGen.   SURGEON:  Phylliss Bob, MD   ASSISTANT:  Pricilla Holm, PA-C.   ANESTHESIA:  General endotracheal anesthesia.   COMPLICATIONS:  None.   DISPOSITION:  Stable.   ESTIMATED BLOOD LOSS:  Minimal.   INDICATIONS FOR SURGERY:  Briefly, Karla Baker is a pleasant 61 y.o. year-old patient, who did present to me with progressive cervical myelopathy.  The patient's MRI did reveal the findings noted above, most notably, cord compression at C3-4 and C4-5.  Given the patient's prominent progressive myelopathic symptoms, and MRI findings, we did discuss proceeding with the procedure noted above.  The patient was fully aware of the risks and limitations of surgery as outlined in my preoperative note.   OPERATIVE DETAILS:  On 03/25/2021, the patient was brought to surgery and general endotracheal anesthesia was administered. The patient was placed supine on the hospital bed. The neck was gently extended.  All bony prominences were meticulously padded.  The neck was prepped and draped in the usual sterile fashion.  At this point, I did make a left-sided transverse incision.  The platysma was incised.  A Smith-Robinson approach was used and the anterior spine was identified. A self-retaining retractor was placed.  I then  subperiosteally exposed the vertebral bodies from C3-C5.  Caspar pins were then placed into the C4 and C5 vertebral bodies and distraction was applied.  A thorough and complete C4/5 intervertebral diskectomy was performed.  The posterior longitudinal ligament was identified and entered using a nerve hook.  I then used #1 followed by #2 Kerrison to perform a thorough and complete intervertebral diskectomy.  The spinal canal was thoroughly decompressed, as was the right and left neuroforamen. The endplates were then prepared and the appropriate-sized intervertebral spacer was then packed with ViviGen and tamped into position in the usual fashion.  The lower Caspar pin was then removed and placed into the C3 vertebral body and once again, distraction was applied across the C3/4 intervertebral space.  I then again performed a thorough and complete diskectomy, thoroughly decompressing the spinal canal and bilateral neuroforamena.  After preparing the endplates, the appropriate-sized intervertebral spacer was packed with ViviGen and tamped into position. The Caspar pins then were removed and bone wax was placed in their place.  The appropriate-sized anterior cervical plate was placed over the anterior spine.  14 mm variable angle screws were placed, 2 in each vertebral body from C3-C5 for a total of 6 vertebral body screws.  The screws were then locked to the plate using the Cam locking mechanism.  I was very pleased with the final fluoroscopic images.  The wound was then irrigated.  The wound was then explored for any undue bleeding and there was no bleeding noted. The wound  was then closed in layers using 2-0 Vicryl, followed by 4-0 Monocryl.  Benzoin and Steri-Strips were applied, followed by sterile dressing.  All instrument counts were correct at the termination of the procedure.   Of note, Pricilla Holm, PA-C, was my assistant throughout surgery, and did aid in retraction, suctioning, placement of the  hardware, and closure from start to finish.     Phylliss Bob, MD

## 2021-03-25 NOTE — Progress Notes (Signed)
Notified Dr. Ambrose Pancoast that patient has not taken her Toprol XL 12.5mg  since last week. Informed him that her blood pressure is 109/78, pulse 91. Per Dr. Ambrose Pancoast, pt does not need to take dose now.

## 2021-03-26 ENCOUNTER — Other Ambulatory Visit: Payer: Self-pay

## 2021-03-29 ENCOUNTER — Encounter (HOSPITAL_COMMUNITY): Payer: Self-pay | Admitting: Orthopedic Surgery

## 2021-04-07 DIAGNOSIS — M5136 Other intervertebral disc degeneration, lumbar region: Secondary | ICD-10-CM | POA: Diagnosis not present

## 2021-04-07 DIAGNOSIS — Z9889 Other specified postprocedural states: Secondary | ICD-10-CM | POA: Diagnosis not present

## 2021-04-13 DIAGNOSIS — F4312 Post-traumatic stress disorder, chronic: Secondary | ICD-10-CM | POA: Diagnosis not present

## 2021-04-29 DIAGNOSIS — E119 Type 2 diabetes mellitus without complications: Secondary | ICD-10-CM | POA: Diagnosis not present

## 2021-04-29 DIAGNOSIS — Z7984 Long term (current) use of oral hypoglycemic drugs: Secondary | ICD-10-CM | POA: Diagnosis not present

## 2021-04-29 DIAGNOSIS — Z974 Presence of external hearing-aid: Secondary | ICD-10-CM | POA: Diagnosis not present

## 2021-04-29 DIAGNOSIS — Z8739 Personal history of other diseases of the musculoskeletal system and connective tissue: Secondary | ICD-10-CM | POA: Diagnosis not present

## 2021-04-29 DIAGNOSIS — Z87891 Personal history of nicotine dependence: Secondary | ICD-10-CM | POA: Diagnosis not present

## 2021-04-29 DIAGNOSIS — R42 Dizziness and giddiness: Secondary | ICD-10-CM | POA: Diagnosis not present

## 2021-04-29 DIAGNOSIS — D333 Benign neoplasm of cranial nerves: Secondary | ICD-10-CM | POA: Diagnosis not present

## 2021-05-03 DIAGNOSIS — R202 Paresthesia of skin: Secondary | ICD-10-CM | POA: Diagnosis not present

## 2021-05-03 DIAGNOSIS — E1169 Type 2 diabetes mellitus with other specified complication: Secondary | ICD-10-CM | POA: Diagnosis not present

## 2021-05-03 DIAGNOSIS — I2510 Atherosclerotic heart disease of native coronary artery without angina pectoris: Secondary | ICD-10-CM | POA: Diagnosis not present

## 2021-05-03 DIAGNOSIS — J439 Emphysema, unspecified: Secondary | ICD-10-CM | POA: Diagnosis not present

## 2021-05-03 DIAGNOSIS — M545 Low back pain, unspecified: Secondary | ICD-10-CM | POA: Diagnosis not present

## 2021-05-03 DIAGNOSIS — N3281 Overactive bladder: Secondary | ICD-10-CM | POA: Diagnosis not present

## 2021-05-12 DIAGNOSIS — F4312 Post-traumatic stress disorder, chronic: Secondary | ICD-10-CM | POA: Diagnosis not present

## 2021-06-09 DIAGNOSIS — F4312 Post-traumatic stress disorder, chronic: Secondary | ICD-10-CM | POA: Diagnosis not present

## 2021-07-05 DIAGNOSIS — H903 Sensorineural hearing loss, bilateral: Secondary | ICD-10-CM | POA: Diagnosis not present

## 2021-07-05 DIAGNOSIS — D3610 Benign neoplasm of peripheral nerves and autonomic nervous system, unspecified: Secondary | ICD-10-CM | POA: Diagnosis not present

## 2021-07-05 DIAGNOSIS — R2689 Other abnormalities of gait and mobility: Secondary | ICD-10-CM | POA: Diagnosis not present

## 2021-07-05 DIAGNOSIS — H9192 Unspecified hearing loss, left ear: Secondary | ICD-10-CM | POA: Diagnosis not present

## 2021-07-05 DIAGNOSIS — D333 Benign neoplasm of cranial nerves: Secondary | ICD-10-CM | POA: Diagnosis not present

## 2021-07-08 NOTE — Therapy (Incomplete)
OUTPATIENT PHYSICAL THERAPY VESTIBULAR EVALUATION     Patient Name: Karla Baker MRN: [id number] DOB:January 10, 1961, 60 y.o., female Today's Date: 07/08/2021  PCP: Iona Beard, MD REFERRING PROVIDER: Lavonia Dana, MD     Past Medical History:  Diagnosis Date   Arthritis    "knees and hands" (07/19/2012)   Boil, back    "popped it 07/08/2012 & it got infected" (07/19/2012)   Chronic lower back pain    Depression    Essential hypertension 05/16/2018   GERD (gastroesophageal reflux disease)    High cholesterol    "don't think I take the RX anymore" (07/19/2012)   Hypertension    Hyperthyroidism    "overactive; never put me on anything for it" (07/19/2012)   Migraines    "only when I took BCP" (07/19/2012)   Palpitations 05/16/2018   Pneumonia    "a couple times" (07/19/2012)   Sleep apnea    does not wear CPAP   Type II diabetes mellitus ([place])    Past Surgical History:  Procedure Laterality Date   ANTERIOR CERVICAL DECOMP/DISCECTOMY FUSION N/A 03/25/2021   Procedure: CERVICALTHREE- CERVICAL FOUR, CERVICAL FOUR- CERVICAL FIVE ANTERIOR CERVICAL DECOMPRESSION FUSION WITH INSTRUMENTATION AND ALLOGRAFT;  Surgeon: Phylliss Bob, MD;  Location: [place];  Service: Orthopedics;  Laterality: N/A;   BREAST BIOPSY Left    u/s guided core biopsy   BUNIONECTOMY Bilateral    many years ago   CESAREAN SECTION  09/22/1980   COLONOSCOPY     ESOPHAGOGASTRODUODENOSCOPY     INCISION AND DRAINAGE ABSCESS N/A 07/20/2012   Procedure: INCISION AND DRAINAGE ABSCESS;  Surgeon: Ralene Ok, MD;  Location: [place];  Service: General;  Laterality: N/A;   KNEE ARTHROSCOPY W/ MENISCAL REPAIR Left 2001   LAPAROSCOPY FOR ECTOPIC PREGNANCY  ~ 1987   left shoulder bone "scraping" Left 2017   REPLACEMENT TOTAL KNEE Left 2009   ruptured disc     TONSILLECTOMY     removed as a child   TUBAL LIGATION  ~ 1987   Patient Active Problem List   Diagnosis Date Noted   Palpitations 05/16/2018    Essential hypertension 05/16/2018   Radiculopathy 06/29/2017   MRSA (methicillin resistant Staphylococcus aureus) 07/23/2012   Abscess of back 07/19/2012    ONSET DATE: ***  REFERRING DIAG: R26.89 (ICD-10-CM) - Other abnormalities of gait and mobility   THERAPY DIAG:  No diagnosis found.  Rationale for Evaluation and Treatment Rehabilitation  SUBJECTIVE:   SUBJECTIVE STATEMENT: ***  Denies head trauma, infection/illness, vision changes/double vision, hearing loss, tinnitus, otalgia, photo/phonophobia.   Pt accompanied by: {accompnied:27141}  PERTINENT HISTORY: depression, HTN, GERD, HLD, HTN, migraines, DMII, ACDF 03/2021, B bunionectomy, L TKA   PAIN:  Are you having pain? {OPRCPAIN:27236}  PRECAUTIONS: {Therapy precautions:24002}  WEIGHT BEARING RESTRICTIONS {Yes ***/No:24003}  FALLS: Has patient fallen in last 6 months? {fallsyesno:27318}  LIVING ENVIRONMENT: Lives with: {OPRC lives with:25569::"lives with their family"} Lives in: {Lives in:25570} Stairs: {opstairs:27293} Has following equipment at home: {Assistive devices:23999}  PLOF: {PLOF:24004}  PATIENT GOALS ***  OBJECTIVE:   DIAGNOSTIC FINDINGS: per ENT note- "MRI temporal bone with and without on 02/23/2021 was independently interpreted by me showing no significant change in the ill-defined enhancement of the left internal to auditory canal extending to the basal turn of the cochlea. Differential remains possible infectious/inflammatory. There is a posterior disc osteophyte at C3-C4 which contributes to spinal canal stenosis."  COGNITION: Overall cognitive status: {cognition:24006}   SENSATION: {sensation:27233}  POSTURE: {posture:25561}   Cervical ROM:    {  AROM/PROM:27142} A/PROM (deg) eval  Flexion   Extension   Right lateral flexion   Left lateral flexion   Right rotation   Left rotation   (Blank rows = not tested)  STRENGTH: ***  LOWER EXTREMITY MMT:   MMT Right eval Left eval   Hip flexion    Hip abduction    Hip adduction    Hip internal rotation    Hip external rotation    Knee flexion    Knee extension    Ankle dorsiflexion    Ankle plantarflexion    Ankle inversion    Ankle eversion    (Blank rows = not tested)  GAIT: Gait pattern: {gait characteristics:25376} Distance walked: *** Assistive device utilized: {Assistive devices:23999} Level of assistance: {Levels of assistance:24026} Comments: ***  FUNCTIONAL TESTs:  {Functional tests:24029}   VESTIBULAR ASSESSMENT   GENERAL OBSERVATION: ***    SYMPTOM BEHAVIOR:   Subjective history: ***   Non-Vestibular symptoms: {nonvestibular symptoms:25260}   Type of dizziness: {Type of Dizziness:25255}   Frequency: ***   Duration: ***   Aggravating factors: {Aggravating Factors:25258}   Relieving factors: {Relieving Factors:25259}   Progression of symptoms: {DESC; BETTER/WORSE:18575}   OCULOMOTOR EXAM:   Ocular Alignment: {Ocular Alignment:25262}   Ocular ROM: {RANGE OF MOTION:21649}   Spontaneous Nystagmus: {Spontaneous nystagmus:25263}   Gaze-Induced Nystagmus: {gaze-induced nystagmus:25264}   Smooth Pursuits: {smooth pursuit:25265}   Saccades: {saccades:25266}   Convergence/Divergence: *** cm    VESTIBULAR - OCULAR REFLEX:    Slow VOR: {slow VOR:25290}   VOR Cancellation: {vor cancellation:25291}   Head-Impulse Test: {head impulse test:25272}   Dynamic Visual Acuity: {dynamic visual acuity:25273}    POSITIONAL TESTING: {Positional tests:25271}     OTHOSTATICS: {Exam; orthostatics:31331}  FUNCTIONAL GAIT: {Functional tests:24029}   VESTIBULAR TREATMENT:  Canalith Repositioning:   {Canalith Repositioning:25283} Gaze Adaptation:   {gaze adaptation:25286} Habituation:   {habituation:25288} Other: ***  PATIENT EDUCATION: Education details: *** Person educated: {Person educated:25204} Education method: {Education Method:25205} Education comprehension: {Education  Comprehension:25206}   GOALS: Goals reviewed with patient? Yes  SHORT TERM GOALS: Target date: {follow up:25551}  Patient to be independent with initial HEP. Baseline: HEP initiated Goal status: INITIAL    LONG TERM GOALS: Target date: {follow up:25551}  Patient to be independent with advanced HEP. Baseline: Not yet initiated  Goal status: INITIAL  Patient to report 0/10 dizziness with standing vertical and horizontal VOR for 30 seconds. Baseline: Unable Goal status: INITIAL  Patient will report 0/10 dizziness with bed mobility.  Baseline: Symptomatic  Goal status: INITIAL  Patient to demonstrate *** sway with M-CTSIB condition with eyes closed/foam surface in order to improve safety in environments with uneven surfaces and dim lighting. Baseline: *** Goal status: INITIAL  Patient to score at least 20/24 on DGI in order to decrease risk of falls. Baseline: *** Goal status: INITIAL  Patient will ambulate over outdoor surfaces with LRAD while performing head turns to scan environment with good stability in order to indicate safe community mobility. Baseline: Unable Goal status: INITIAL    ASSESSMENT:  CLINICAL IMPRESSION:   Patient is a 61 y/o F presenting to OPPT with c/o dizziness for the past ***.   Denies head trauma, infection/illness, vision changes/double vision, hearing loss, tinnitus, otalgia, photo/phonophobia.  Oculomotor exam revealed ***.  Positional testing was ***.   Patient was educated on gentle *** HEP and reported understanding. Would benefit from skilled PT services ***x/week for *** weeks to address aforementioned impairments in order to optimize level of function.  OBJECTIVE IMPAIRMENTS {opptimpairments:25111}.   ACTIVITY LIMITATIONS {activity limitations:25113}.   PERSONAL FACTORS {Personal factors:25162} are also affecting patient's functional outcome.    REHAB POTENTIAL: {rehabpotential:25112}  CLINICAL DECISION MAKING:  {clinical decision making:25114}  EVALUATION COMPLEXITY: {Evaluation complexity:25115}   PLAN: PT FREQUENCY: {rehab frequency:25116}  PT DURATION: {rehab duration:25117}  PLANNED INTERVENTIONS: {rehab planned interventions:25118::"Therapeutic exercises","Therapeutic activity","Neuromuscular re-education","Balance training","Gait training","Patient/Family education","Joint mobilization"}  PLAN FOR NEXT SESSION: Manuela Neptune, PT 07/08/2021, 4:30 PM

## 2021-07-09 ENCOUNTER — Encounter: Payer: Self-pay | Admitting: Physical Therapy

## 2021-07-13 ENCOUNTER — Ambulatory Visit: Payer: Medicare Other | Attending: Otolaryngology | Admitting: Physical Therapy

## 2021-07-13 ENCOUNTER — Telehealth: Payer: Self-pay | Admitting: Physical Therapy

## 2021-07-13 ENCOUNTER — Other Ambulatory Visit: Payer: Self-pay

## 2021-07-13 DIAGNOSIS — M6281 Muscle weakness (generalized): Secondary | ICD-10-CM | POA: Insufficient documentation

## 2021-07-13 DIAGNOSIS — R2689 Other abnormalities of gait and mobility: Secondary | ICD-10-CM | POA: Diagnosis not present

## 2021-07-13 DIAGNOSIS — R2681 Unsteadiness on feet: Secondary | ICD-10-CM | POA: Diagnosis not present

## 2021-07-13 NOTE — Therapy (Signed)
OUTPATIENT PHYSICAL THERAPY EVALUATION     Patient Name: Karla Baker MRN: [id number] DOB:[birth year], 61 y.o., female Today's Date: 07/13/2021  PCP: Iona Beard, MD REFERRING PROVIDER: Lavonia Dana, MD    PT End of Session - 07/13/21 1808     Visit Number 1    Number of Visits 17    Date for PT Re-Evaluation 09/07/21    Authorization Type Medicare    Progress Note Due on Visit 10    PT Start Time 0850    PT Stop Time 0934    PT Time Calculation (min) 44 min    Equipment Utilized During Treatment Gait belt    Activity Tolerance Patient tolerated treatment well    Behavior During Therapy WFL for tasks assessed/performed             Past Medical History:  Diagnosis Date   Arthritis    "knees and hands" (07/19/2012)   Boil, back    "popped it 07/08/2012 & it got infected" (07/19/2012)   Chronic lower back pain    Depression    Essential hypertension 05/16/2018   GERD (gastroesophageal reflux disease)    High cholesterol    "don't think I take the RX anymore" (07/19/2012)   Hypertension    Hyperthyroidism    "overactive; never put me on anything for it" (07/19/2012)   Migraines    "only when I took BCP" (07/19/2012)   Palpitations 05/16/2018   Pneumonia    "a couple times" (07/19/2012)   Sleep apnea    does not wear CPAP   Type II diabetes mellitus ([place])    Past Surgical History:  Procedure Laterality Date   ANTERIOR CERVICAL DECOMP/DISCECTOMY FUSION N/A 03/25/2021   Procedure: CERVICALTHREE- CERVICAL FOUR, CERVICAL FOUR- CERVICAL FIVE ANTERIOR CERVICAL DECOMPRESSION FUSION WITH INSTRUMENTATION AND ALLOGRAFT;  Surgeon: Phylliss Bob, MD;  Location: [place];  Service: Orthopedics;  Laterality: N/A;   BREAST BIOPSY Left    u/s guided core biopsy   BUNIONECTOMY Bilateral    many years ago   CESAREAN SECTION  09/22/1980   COLONOSCOPY     ESOPHAGOGASTRODUODENOSCOPY     INCISION AND DRAINAGE ABSCESS N/A 07/20/2012   Procedure: INCISION AND DRAINAGE ABSCESS;   Surgeon: Ralene Ok, MD;  Location: [place];  Service: General;  Laterality: N/A;   KNEE ARTHROSCOPY W/ MENISCAL REPAIR Left 2001   LAPAROSCOPY FOR ECTOPIC PREGNANCY  ~ 1987   left shoulder bone "scraping" Left 2017   REPLACEMENT TOTAL KNEE Left 2009   ruptured disc     TONSILLECTOMY     removed as a child   TUBAL LIGATION  ~ 1987   Patient Active Problem List   Diagnosis Date Noted   Palpitations 05/16/2018   Essential hypertension 05/16/2018   Radiculopathy 06/29/2017   MRSA (methicillin resistant Staphylococcus aureus) 07/23/2012   Abscess of back 07/19/2012    ONSET DATE: 07/06/2021 (MD referral); worsening balance pas 4-6 months  REFERRING DIAG: R26.89 (ICD-10-CM) - Other abnormalities of gait and mobility   THERAPY DIAG:  Other abnormalities of gait and mobility  Muscle weakness (generalized)  Unsteadiness on feet  Rationale for Evaluation and Treatment Rehabilitation  SUBJECTIVE:   SUBJECTIVE STATEMENT: Just had the neck surgery 03/25/21.  Have paralysis developed because the surgery waited too long.  Lost all strength in arms, hands, legs, with R side more affected than the other.  The R arm is still pins and needles.  Pt reports at this point, dizziness is not vertigo, but it is  more unsteadiness and imbalance.  Pt wants to work on balance and strengthening. Pt accompanied by: self and significant other  PERTINENT HISTORY: L vestibular schwannoma, GERD, HTN, migraines, chronic LBP, arthritis, depression, sleep apnea, DM, ACDF 03/2021  From MD note/audiologist note:  "longstanding disequilibrium and gradually progressive hearing loss in the left ear. She reports a significant decline in her balance in the past 4-6 months. She was found to have bone spurs in her cervical spine which resulted in numbness in her hands. She also reports some numbness in her left foot. She underwent sugery on her cervical spine in February of 2023. She utilizes bifocal lenses at all times.  She has a history of lumbar spine issues, knee surgery, and foot surgery. She reports two isolated episodes of spontaneous vertigo that occurred in the late summer of 2022  Partially compensated left peripheral vestibular hypofunction impacting at least the horizontal semicircular canal and/or superior branch of the vestibular nerve. Her overall symptomology is likely multifactorial in nature and adversely impacted by the peripheral vestibular hypofunction, multifocal lens use, and lower extremity orthopedic abnormalities"   PAIN:  Are you having pain? Yes: NPRS scale: 5/10 Pain location: arms, hands Pain description: aggravating, numbness and tingling, burning Aggravating factors: unsure, all the time Relieving factors: lying down  PRECAUTIONS: Fall and Other: No lifting >10#  WEIGHT BEARING RESTRICTIONS No  FALLS: Has patient fallen in last 6 months? Yes. Number of falls 8-10  LIVING ENVIRONMENT: Lives with: lives with their family and lives with their spouse Lives in: House/apartment Stairs: Yes: Internal: 14 steps; on right going up and External: 2 steps; can reach both Has following equipment at home: Single point cane and Walker - 4 wheeled (does not use)  PLOF: Independent  PATIENT GOALS Want exercises and activities to improve my balance.  OBJECTIVE:   DIAGNOSTIC FINDINGS: See note above from MD/audiologist    SENSATION: Light touch: Impaired  and more pins and needles in R hand WFL for light touch BLEs   POSTURE: rounded shoulders, forward head, and posterior pelvic tilt   Cervical ROM:    Active A/PROM (deg) eval  Flexion 20  Extension 45  Right lateral flexion 41  Left lateral flexion 41  Right rotation   Left rotation   (Blank rows = not tested)  STRENGTH: BUE shoulder flexion, abduction 3+/5; BUE elbow flexion, extension 4/5, decreased grip strength  LOWER EXTREMITY MMT:   MMT Right eval Left eval  Hip flexion 3+/5 3+5  Hip abduction 4/5 4/5   Hip adduction 4/5 4/5  Hip internal rotation    Hip external rotation    Knee flexion 3+/5 4/5  Knee extension 4/5 4/5  Ankle dorsiflexion 4/5 4/5      Ankle inversion    Ankle eversion    (Blank rows = not tested)  TRANSFERS: Assistive device utilized: None  Sit to stand: SBA Stand to sit: CGA   GAIT: Gait pattern: step through pattern, decreased step length- Right, decreased step length- Left, ataxic, and wide BOS Distance walked: 60 ft x 2 Assistive device utilized:  HHA of PT Level of assistance: Min A Comments: Gait velocity:  18.96 sec (1.73 ft/sec)-Scores <1.8 ft/sec indicate increased fall risk  FUNCTIONAL TESTs:  5 times sit to stand: 19.07 sec Timed up and go (TUG): 27.31  (Scores >13.5 sec indicate increased fall risk)   M-CTSIB  Condition 1: Firm Surface, EO 30 Sec, Mild Sway  Condition 2: Firm Surface, EC 20 Sec, Moderate Sway  Condition 3: Foam Surface, EO 30 Sec, Mild Sway  Condition 4: Foam Surface, EC 5.44 Sec, Moderate Sway      VESTIBULAR ASSESSMENT-Did not proceed with vestibular eval today, as pt reports her focus is on imbalance, unsteadiness, weakness     PATIENT EDUCATION: Education details: PT eval results, POC Person educated: Patient and Spouse Education method: Explanation Education comprehension: verbalized understanding   GOALS: Goals reviewed with patient? Yes  SHORT TERM GOALS: Target date: 08/10/2021   Pt will be independent with HEP to address strength, balance, gait for improved mobility and decreased fall risk. Baseline: Goal status: INITIAL  2.  Pt will improve 5x sit<>stand to less than or equal to 15 sec to demonstrate improved functional strength and transfer efficiency.  Baseline: 19.07 sec Goal status: INITIAL  3.  Further vestibular testing as needed. Baseline:  Goal status: INITIAL  LONG TERM GOALS: Target date: 09/07/2021    Pt will be independent with progression of HEP to address strength, balance, gait  for improved mobility and decreased fall risk. Baseline:  Goal status: INITIAL  2.  Pt will improve 5x sit<>stand to less than or equal to 11.5 sec to demonstrate improved functional strength and transfer efficiency.  Baseline:  Goal status: INITIAL  3.  Pt will improve TUG score to less than or equal to 20 sec for decreased fall risk.  Baseline: 27.31 sec Goal status: INITIAL  4.  Berg Balance test to be assessed, with pt to improve by at least 5 points for decreased fall risk. Baseline:  Goal status: INITIAL  5.  Pt will improve gait velocity to at least 2 ft/sec for improved gait efficiency and safety.  Baseline: 1.73 ft/sec Goal status: INITIAL  ASSESSMENT:  CLINICAL IMPRESSION: Patient is a 61 y.o. female who was seen today for physical therapy evaluation and treatment for other abnormalities of gait and mobility following vertigo, hx of vestibular schwannoma, hx of orthopedic surgeries, and ACDF 03/2021.  Pt has had extensive audiologic and otolaryngology testing, and has been referred to OPPT due to hx of more balance issues and falls in the past several months.  Focused today's eval on balance and strength assessment, not vestibular evaluation, due to pt reporting that her "dizziness" was more unsteadiness and off-balance, and her main concern is balance, walking, and strength.  She presents as high fall risk based on FTSTS test, TUG and gait velocity scores.  She would benefit from skilled PT to address the stated deficits for decreased fall risk and improved overall functional mobility. *Of note, pt c/o pain, decreased coordination, and decreased functional use of RUE and LUE and PT recommends OT evaluation and treatment referral.  OBJECTIVE IMPAIRMENTS Abnormal gait, decreased activity tolerance, decreased balance, decreased knowledge of use of DME, decreased mobility, difficulty walking, decreased ROM, decreased strength, impaired flexibility, and postural dysfunction.    ACTIVITY LIMITATIONS carrying, bending, standing, squatting, stairs, transfers, and locomotion level  PARTICIPATION LIMITATIONS: meal prep, cleaning, laundry, shopping, community activity, and occupation  PERSONAL FACTORS 3+ comorbidities: See PMH above/problem list  are also affecting patient's functional outcome.   REHAB POTENTIAL: Good  CLINICAL DECISION MAKING: Evolving/moderate complexity  EVALUATION COMPLEXITY: Moderate   PLAN: PT FREQUENCY: 2x/week  PT DURATION: 8 weeks  PLANNED INTERVENTIONS: Therapeutic exercises, Therapeutic activity, Neuromuscular re-education, Balance training, Gait training, Patient/Family education, Joint mobilization, Stair training, Vestibular training, Canalith repositioning, DME instructions, and Manual therapy  PLAN FOR NEXT SESSION: Assess Berg Balance score; initiate HEP to address core, hip strengthening.  Gait training in PT session with rollator (if appropriate, please recommend she use this at home for improved safety with gait.)   Laurelai Lepp W., PT 07/13/2021, 6:34 PM

## 2021-07-13 NOTE — Telephone Encounter (Signed)
Dr. Lynann Bologna, Mrs. Karla Baker was referred to OPPT at [facility] from her otolaryngologist, Dr. Azucena Cecil, due to her hx of balance, vestibular deficits.  At her evaluation today, she mentioned that she is having pain, parathesias, decreased coordination and decreased BUE strength, and had the hx of the ACDF surgery in Februrary 2023.  [name] would benefit from occupational therapy to address the previously mentioned UE difficulties.  If you agree, could you please send order to [facility], for Occupational therapy (OT) eval and treat?  Thank you.  Karla Baker, PT 07/13/21 6:43 PM Phone: [phone] Fax: [phone]  *Note faxed to Dr. Lynann Bologna

## 2021-07-16 ENCOUNTER — Ambulatory Visit: Payer: Medicare Other

## 2021-07-16 DIAGNOSIS — M6281 Muscle weakness (generalized): Secondary | ICD-10-CM

## 2021-07-16 DIAGNOSIS — R2689 Other abnormalities of gait and mobility: Secondary | ICD-10-CM | POA: Diagnosis not present

## 2021-07-16 DIAGNOSIS — R2681 Unsteadiness on feet: Secondary | ICD-10-CM | POA: Diagnosis not present

## 2021-07-16 NOTE — Therapy (Signed)
OUTPATIENT PHYSICAL THERAPY TREATMENT NOTE   Patient Name: Karla Baker MRN: [id number] DOB:22-Jul-1960, 61 y.o., female Today's Date: 07/16/2021  PCP: PCP: Iona Beard, MD  REFERRING PROVIDER: REFERRING PROVIDER: Lavonia Dana, MD  END OF SESSION:   PT End of Session - 07/16/21 0843     Visit Number 2    Number of Visits 17    Date for PT Re-Evaluation 09/07/21    Authorization Type Medicare    Progress Note Due on Visit 10    PT Start Time 0845    PT Stop Time 0930    PT Time Calculation (min) 45 min    Equipment Utilized During Treatment Gait belt    Activity Tolerance Patient tolerated treatment well    Behavior During Therapy WFL for tasks assessed/performed             Past Medical History:  Diagnosis Date   Arthritis    "knees and hands" (07/19/2012)   Boil, back    "popped it 07/08/2012 & it got infected" (07/19/2012)   Chronic lower back pain    Depression    Essential hypertension 05/16/2018   GERD (gastroesophageal reflux disease)    High cholesterol    "don't think I take the RX anymore" (07/19/2012)   Hypertension    Hyperthyroidism    "overactive; never put me on anything for it" (07/19/2012)   Migraines    "only when I took BCP" (07/19/2012)   Palpitations 05/16/2018   Pneumonia    "a couple times" (07/19/2012)   Sleep apnea    does not wear CPAP   Type II diabetes mellitus ([place])    Past Surgical History:  Procedure Laterality Date   ANTERIOR CERVICAL DECOMP/DISCECTOMY FUSION N/A 03/25/2021   Procedure: CERVICALTHREE- CERVICAL FOUR, CERVICAL FOUR- CERVICAL FIVE ANTERIOR CERVICAL DECOMPRESSION FUSION WITH INSTRUMENTATION AND ALLOGRAFT;  Surgeon: Phylliss Bob, MD;  Location: [facility];  Service: Orthopedics;  Laterality: N/A;   BREAST BIOPSY Left    u/s guided core biopsy   BUNIONECTOMY Bilateral    many years ago   CESAREAN SECTION  09/22/1980   COLONOSCOPY     ESOPHAGOGASTRODUODENOSCOPY     INCISION AND DRAINAGE ABSCESS N/A 07/20/2012    Procedure: INCISION AND DRAINAGE ABSCESS;  Surgeon: Ralene Ok, MD;  Location: [facility];  Service: General;  Laterality: N/A;   KNEE ARTHROSCOPY W/ MENISCAL REPAIR Left 2001   LAPAROSCOPY FOR ECTOPIC PREGNANCY  ~ 1987   left shoulder bone "scraping" Left 2017   REPLACEMENT TOTAL KNEE Left 2009   ruptured disc     TONSILLECTOMY     removed as a child   TUBAL LIGATION  ~ 1987   Patient Active Problem List   Diagnosis Date Noted   Palpitations 05/16/2018   Essential hypertension 05/16/2018   Radiculopathy 06/29/2017   MRSA (methicillin resistant Staphylococcus aureus) 07/23/2012   Abscess of back 07/19/2012    REFERRING DIAG: REFERRING DIAG: R26.89 (ICD-10-CM) - Other abnormalities of gait and mobility   THERAPY DIAG:  Other abnormalities of gait and mobility  Muscle weakness (generalized)  Unsteadiness on feet  Rationale for Evaluation and Treatment Rehabilitation  PERTINENT HISTORY: PERTINENT HISTORY: L vestibular schwannoma, GERD, HTN, migraines, chronic LBP, arthritis, depression, sleep apnea, DM, ACDF 03/2021   PRECAUTIONS: PRECAUTIONS: Fall and Other: No lifting >10#   SUBJECTIVE: Feeling weak and some dizziness and low back discomfort, had a nerve block performed in low back last Thursday  PAIN:  Are you having pain? Yes: NPRS scale: 5/10  Pain location: low back Pain description: aching, sore Aggravating factors: prolonged standing, walking Relieving factors: sitting   OBJECTIVE:    TODAY'S TREATMENT: 07/16/21 Activity Comments  Berg Balance Test: 42/56 Indicates high risk for falls  Sit to stand from rollator 2x5   Tandem stance 2x30 sec  Intermittent UE support  Standing hip abduction 2x10 UE support  Gait training with rollator and supervision to improve safety and performance   NU-step level 2 x 7.5 min       DIAGNOSTIC FINDINGS: See note above from MD/audiologist               SENSATION: Light touch: Impaired  and more pins and needles in R  hand WFL for light touch BLEs     POSTURE: rounded shoulders, forward head, and posterior pelvic tilt     Cervical ROM:     Active A/PROM (deg) eval  Flexion 20  Extension 45  Right lateral flexion 41  Left lateral flexion 41  Right rotation    Left rotation    (Blank rows = not tested)   STRENGTH: BUE shoulder flexion, abduction 3+/5; BUE elbow flexion, extension 4/5, decreased grip strength   LOWER EXTREMITY MMT:    MMT Right eval Left eval  Hip flexion 3+/5 3+5  Hip abduction 4/5 4/5  Hip adduction 4/5 4/5  Hip internal rotation      Hip external rotation      Knee flexion 3+/5 4/5  Knee extension 4/5 4/5  Ankle dorsiflexion 4/5 4/5         Ankle inversion      Ankle eversion      (Blank rows = not tested)   TRANSFERS: Assistive device utilized: None  Sit to stand: SBA Stand to sit: CGA     GAIT: Gait pattern: step through pattern, decreased step length- Right, decreased step length- Left, ataxic, and wide BOS Distance walked: 60 ft x 2 Assistive device utilized:  HHA of PT Level of assistance: Min A Comments: Gait velocity:  18.96 sec (1.73 ft/sec)-Scores <1.8 ft/sec indicate increased fall risk   FUNCTIONAL TESTs:  5 times sit to stand: 19.07 sec Timed up and go (TUG): 27.31     (Scores >13.5 sec indicate increased fall risk)  Berg Balance Test: 42/56 indicating high risk for falls      M-CTSIB  Condition 1: Firm Surface, EO 30 Sec, Mild Sway  Condition 2: Firm Surface, EC 20 Sec, Moderate Sway  Condition 3: Foam Surface, EO 30 Sec, Mild Sway  Condition 4: Foam Surface, EC 5.44 Sec, Moderate Sway        VESTIBULAR ASSESSMENT-Did not proceed with vestibular eval today, as pt reports her focus is on imbalance, unsteadiness, weakness                 PATIENT EDUCATION: Education details: education regarding program and optimal exercise sequence and use of sets/reps to develop and progress for improved self-efficacy. Reinforced rationale of using  rollator/AD for gait as she is categorized as high risk for falls and demonstrates gait deviations  Person educated: Patient  Education method: Explanation Education comprehension: verbalized understanding     GOALS: Goals reviewed with patient? Yes   SHORT TERM GOALS: Target date: 08/10/2021    Pt will be independent with HEP to address strength, balance, gait for improved mobility and decreased fall risk. Baseline: Goal status: INITIAL   2.  Pt will improve 5x sit<>stand to less than or equal to 15 sec to  demonstrate improved functional strength and transfer efficiency.  Baseline: 19.07 sec Goal status: INITIAL   3.  Further vestibular testing as needed. Baseline:  Goal status: INITIAL   LONG TERM GOALS: Target date: 09/07/2021     Pt will be independent with progression of HEP to address strength, balance, gait for improved mobility and decreased fall risk. Baseline:  Goal status: INITIAL   2.  Pt will improve 5x sit<>stand to less than or equal to 11.5 sec to demonstrate improved functional strength and transfer efficiency.   Baseline:  Goal status: INITIAL   3.  Pt will improve TUG score to less than or equal to 20 sec for decreased fall risk.   Baseline: 27.31 sec Goal status: INITIAL   4.  Berg Balance test to be assessed, with pt to improve by at least 5 points for decreased fall risk. Baseline:  Goal status: INITIAL   5.  Pt will improve gait velocity to at least 2 ft/sec for improved gait efficiency and safety.   Baseline: 1.73 ft/sec Goal status: INITIAL   ASSESSMENT:   CLINICAL IMPRESSION: Patient educated in benefits of consistent exercise and performance of progressive walking program to institute at her [employer] where she is a member and to perform mix of balance/strength exercises during bouts of walking to improve functional performance.  Performed Berg Balance Test with score of 42/56 indicating high risk for falls and demonstrates a unsteady gait  pattern with somewhat Trendelenberg presentation.  Educated in sequence of exercise as it pertains to energy system/fiber selectivity to improve efficiency. Continued sessions to progress strength, balance, and activity tolerance to facilitate safe and independent ambulation.   OBJECTIVE IMPAIRMENTS Abnormal gait, decreased activity tolerance, decreased balance, decreased knowledge of use of DME, decreased mobility, difficulty walking, decreased ROM, decreased strength, impaired flexibility, and postural dysfunction.    ACTIVITY LIMITATIONS carrying, bending, standing, squatting, stairs, transfers, and locomotion level   PARTICIPATION LIMITATIONS: meal prep, cleaning, laundry, shopping, community activity, and occupation   PERSONAL FACTORS 3+ comorbidities: See PMH above/problem list  are also affecting patient's functional outcome.    REHAB POTENTIAL: Good   CLINICAL DECISION MAKING: Evolving/moderate complexity   EVALUATION COMPLEXITY: Moderate     PLAN: PT FREQUENCY: 2x/week   PT DURATION: 8 weeks   PLANNED INTERVENTIONS: Therapeutic exercises, Therapeutic activity, Neuromuscular re-education, Balance training, Gait training, Patient/Family education, Joint mobilization, Stair training, Vestibular training, Canalith repositioning, DME instructions, and Manual therapy   PLAN FOR NEXT SESSION: Assess Berg Balance score; initiate HEP to address core, hip strengthening.  Gait training in PT session with rollator (if appropriate, please recommend she use this at home for improved safety with gait.)  Home Exercise Program Access Code: [email or web address] URL: [email or web address] Date: 07/16/2021 Prepared by: Sherlyn Lees  Exercises - Sit to Stand Without Arm Support  - 1 x daily - 7 x weekly - 3 sets - 5 reps - Tandem Stance with Support  - 1 x daily - 7 x weekly - 2 sets - 30 sec hold - Standing Hip Abduction with Counter Support  - 1 x daily - 7 x weekly - 3 sets - 10 reps     9:38 AM, 07/16/21 M. Sherlyn Lees, PT, DPT Physical Therapist- [place] Office Number: [phone]

## 2021-07-20 ENCOUNTER — Encounter: Payer: Self-pay | Admitting: Physical Therapy

## 2021-07-20 ENCOUNTER — Ambulatory Visit: Payer: Medicare Other | Admitting: Physical Therapy

## 2021-07-20 DIAGNOSIS — M6281 Muscle weakness (generalized): Secondary | ICD-10-CM

## 2021-07-20 DIAGNOSIS — R2681 Unsteadiness on feet: Secondary | ICD-10-CM | POA: Diagnosis not present

## 2021-07-20 DIAGNOSIS — R2689 Other abnormalities of gait and mobility: Secondary | ICD-10-CM | POA: Diagnosis not present

## 2021-07-20 NOTE — Therapy (Signed)
OUTPATIENT PHYSICAL THERAPY TREATMENT NOTE   Patient Name: Karla Baker MRN: [id number] DOB:January 17, 1961, 61 y.o., female Today's Date: 07/20/2021  PCP: PCP: Iona Beard, MD  REFERRING PROVIDER: REFERRING PROVIDER: Lavonia Dana, MD  END OF SESSION:   PT End of Session - 07/20/21 0845     Visit Number 3    Number of Visits 17    Date for PT Re-Evaluation 09/07/21    Authorization Type Medicare    Progress Note Due on Visit 10    PT Start Time 0848    PT Stop Time 0926    PT Time Calculation (min) 38 min    Equipment Utilized During Treatment Gait belt    Activity Tolerance Patient tolerated treatment well    Behavior During Therapy WFL for tasks assessed/performed              Past Medical History:  Diagnosis Date   Arthritis    "knees and hands" (07/19/2012)   Boil, back    "popped it 07/08/2012 & it got infected" (07/19/2012)   Chronic lower back pain    Depression    Essential hypertension 05/16/2018   GERD (gastroesophageal reflux disease)    High cholesterol    "don't think I take the RX anymore" (07/19/2012)   Hypertension    Hyperthyroidism    "overactive; never put me on anything for it" (07/19/2012)   Migraines    "only when I took BCP" (07/19/2012)   Palpitations 05/16/2018   Pneumonia    "a couple times" (07/19/2012)   Sleep apnea    does not wear CPAP   Type II diabetes mellitus ([place])    Past Surgical History:  Procedure Laterality Date   ANTERIOR CERVICAL DECOMP/DISCECTOMY FUSION N/A 03/25/2021   Procedure: CERVICALTHREE- CERVICAL FOUR, CERVICAL FOUR- CERVICAL FIVE ANTERIOR CERVICAL DECOMPRESSION FUSION WITH INSTRUMENTATION AND ALLOGRAFT;  Surgeon: Phylliss Bob, MD;  Location: [facility];  Service: Orthopedics;  Laterality: N/A;   BREAST BIOPSY Left    u/s guided core biopsy   BUNIONECTOMY Bilateral    many years ago   CESAREAN SECTION  09/22/1980   COLONOSCOPY     ESOPHAGOGASTRODUODENOSCOPY     INCISION AND DRAINAGE ABSCESS N/A 07/20/2012    Procedure: INCISION AND DRAINAGE ABSCESS;  Surgeon: Ralene Ok, MD;  Location: [facility];  Service: General;  Laterality: N/A;   KNEE ARTHROSCOPY W/ MENISCAL REPAIR Left 2001   LAPAROSCOPY FOR ECTOPIC PREGNANCY  ~ 1987   left shoulder bone "scraping" Left 2017   REPLACEMENT TOTAL KNEE Left 2009   ruptured disc     TONSILLECTOMY     removed as a child   TUBAL LIGATION  ~ 1987   Patient Active Problem List   Diagnosis Date Noted   Palpitations 05/16/2018   Essential hypertension 05/16/2018   Radiculopathy 06/29/2017   MRSA (methicillin resistant Staphylococcus aureus) 07/23/2012   Abscess of back 07/19/2012    REFERRING DIAG: REFERRING DIAG: R26.89 (ICD-10-CM) - Other abnormalities of gait and mobility   THERAPY DIAG:  Other abnormalities of gait and mobility  Muscle weakness (generalized)  Unsteadiness on feet  Rationale for Evaluation and Treatment Rehabilitation  PERTINENT HISTORY: PERTINENT HISTORY: L vestibular schwannoma, GERD, HTN, migraines, chronic LBP, arthritis, depression, sleep apnea, DM, ACDF 03/2021   PRECAUTIONS: PRECAUTIONS: Fall and Other: No lifting >10#   SUBJECTIVE: Been using the walker at home on Sunday and Monday.  Did my exercises those days also.  [place] int the store with my friend in the bathroom.  Didn't hurt myself and didn't hit my head.  PAIN:  Are you having pain? Yes: NPRS scale: 7/10 Pain location: low back/buttocks Pain description: aching, sore Aggravating factors: prolonged standing, walking Relieving factors: sitting   OBJECTIVE:     TODAY'S TREATMENT: 07/16/21 Activity Comments  Reviewed HEP-see below Min cues for UE support as needed for stability  Sit<>stand, no UE support, from mat   Tandem stance 2x30 sec  Intermittent UE support  Standing hip abduction 3x10 UE support and cues for abdominal activation, technique, for trunk stability  Alt step taps, to 6" step, 3 x 10 reps, BUE support>1 UE support Cues for abdominal  activation to lessen excess anterior pelvic tilt and retropulsion moment at shoulders  Forward step ups, 10 reps, each leg leading BUE support and cues for abdominal activation  Sidestep together R and L, 10 reps with UE support   Gait training with rollator and supervision to improve safety and performance Cues for abdominal activation and upright posture with gait.  Standing wide BOS ant/posterior weightshift through hips for low back, hamstring stretch Cues for technique  Seated piriformis stretch,each side, 3 x 10 sec Verbal and visual cues for technique  NU-step level 3 x 8 min, steps/min >80  Cues for abdominal activation, upright posture                      PATIENT EDUCATION: Education details: Reinforced rationale of using rollator/AD for gait as she is categorized as high risk for falls and demonstrates gait deviations  Person educated: Patient  Education method: Explanation Education comprehension: verbalized understanding     GOALS: Goals reviewed with patient? Yes   SHORT TERM GOALS: Target date: 08/10/2021    Pt will be independent with HEP to address strength, balance, gait for improved mobility and decreased fall risk. Baseline: Goal status:IN PROGRESS   2.  Pt will improve 5x sit<>stand to less than or equal to 15 sec to demonstrate improved functional strength and transfer efficiency.  Baseline: 19.07 sec Goal status: IN PROGRESS   3.  Further vestibular testing as needed. Baseline:  Goal status: IN PROGRESS   LONG TERM GOALS: Target date: 09/07/2021     Pt will be independent with progression of HEP to address strength, balance, gait for improved mobility and decreased fall risk. Baseline:  Goal status: IN PROGRESS   2.  Pt will improve 5x sit<>stand to less than or equal to 11.5 sec to demonstrate improved functional strength and transfer efficiency.   Baseline:  Goal status: IN PROGRESS   3.  Pt will improve TUG score to less than or equal to 20 sec  for decreased fall risk.   Baseline: 27.31 sec Goal status: IN PROGRESS   4.  Berg Balance test to be assessed, with pt to improve by at least 5 points for decreased fall risk. Baseline:  Goal status: IN PROGRESS   5.  Pt will improve gait velocity to at least 2 ft/sec for improved gait efficiency and safety.   Baseline: 1.73 ft/sec Goal status: IN PROGRESS   ASSESSMENT:   CLINICAL IMPRESSION: Reviewed HEP this visit, with pt return demo understanding, overall, but needs cues for technique and for abdominal activation to help with improved stability.  Worked on single limb stance and hip stability with UE support.  Pt has tendency to arch low back with shoulders behind hips, which compromises balance.  Cues throughout for abdominal activation and neutral lumbar spine for improved stability with  standing exercises.  She will continue to benefit from skilled PT towards goals for improved overall functional mobility and decreased fall risk.   OBJECTIVE IMPAIRMENTS Abnormal gait, decreased activity tolerance, decreased balance, decreased knowledge of use of DME, decreased mobility, difficulty walking, decreased ROM, decreased strength, impaired flexibility, and postural dysfunction.    ACTIVITY LIMITATIONS carrying, bending, standing, squatting, stairs, transfers, and locomotion level   PARTICIPATION LIMITATIONS: meal prep, cleaning, laundry, shopping, community activity, and occupation   PERSONAL FACTORS 3+ comorbidities: See PMH above/problem list  are also affecting patient's functional outcome.    REHAB POTENTIAL: Good   CLINICAL DECISION MAKING: Evolving/moderate complexity   EVALUATION COMPLEXITY: Moderate     PLAN: PT FREQUENCY: 2x/week   PT DURATION: 8 weeks   PLANNED INTERVENTIONS: Therapeutic exercises, Therapeutic activity, Neuromuscular re-education, Balance training, Gait training, Patient/Family education, Joint mobilization, Stair training, Vestibular training,  Canalith repositioning, DME instructions, and Manual therapy   PLAN FOR NEXT SESSION: Add to HEP to address core, hip strengthening; balance training.   Gait training in PT session with rollator and continue to encourage pt to use at home.  Home Exercise Program Access Code: [email or web address] URL: [email or web address] Date: 07/16/2021 Prepared by: Sherlyn Lees  Exercises - Sit to Stand Without Arm Support  - 1 x daily - 7 x weekly - 3 sets - 5 reps - Tandem Stance with Support  - 1 x daily - 7 x weekly - 2 sets - 30 sec hold - Standing Hip Abduction with Counter Support  - 1 x daily - 7 x weekly - 3 sets - 10 reps    Mady Haagensen, PT 07/20/21 9:28 AM Phone: [phone] Fax: [phone]

## 2021-07-23 ENCOUNTER — Ambulatory Visit: Payer: Medicare Other | Attending: Otolaryngology

## 2021-07-23 DIAGNOSIS — M6281 Muscle weakness (generalized): Secondary | ICD-10-CM | POA: Diagnosis not present

## 2021-07-23 DIAGNOSIS — R2681 Unsteadiness on feet: Secondary | ICD-10-CM | POA: Diagnosis not present

## 2021-07-23 DIAGNOSIS — R2689 Other abnormalities of gait and mobility: Secondary | ICD-10-CM | POA: Insufficient documentation

## 2021-07-23 NOTE — Therapy (Signed)
OUTPATIENT PHYSICAL THERAPY TREATMENT NOTE   Patient Name: Karla Baker MRN: [id number] DOB:Jul 17, 1960, 61 y.o., female Today's Date: 07/23/2021  PCP: PCP: Iona Beard, MD  REFERRING PROVIDER: REFERRING PROVIDER: Lavonia Dana, MD  END OF SESSION:   PT End of Session - 07/23/21 0802     Visit Number 4    Number of Visits 17    Date for PT Re-Evaluation 09/07/21    Authorization Type Medicare    Progress Note Due on Visit 10    PT Start Time 0800    PT Stop Time 0845    PT Time Calculation (min) 45 min    Equipment Utilized During Treatment Gait belt    Activity Tolerance Patient tolerated treatment well    Behavior During Therapy WFL for tasks assessed/performed              Past Medical History:  Diagnosis Date   Arthritis    "knees and hands" (07/19/2012)   Boil, back    "popped it 07/08/2012 & it got infected" (07/19/2012)   Chronic lower back pain    Depression    Essential hypertension 05/16/2018   GERD (gastroesophageal reflux disease)    High cholesterol    "don't think I take the RX anymore" (07/19/2012)   Hypertension    Hyperthyroidism    "overactive; never put me on anything for it" (07/19/2012)   Migraines    "only when I took BCP" (07/19/2012)   Palpitations 05/16/2018   Pneumonia    "a couple times" (07/19/2012)   Sleep apnea    does not wear CPAP   Type II diabetes mellitus ([place])    Past Surgical History:  Procedure Laterality Date   ANTERIOR CERVICAL DECOMP/DISCECTOMY FUSION N/A 03/25/2021   Procedure: CERVICALTHREE- CERVICAL FOUR, CERVICAL FOUR- CERVICAL FIVE ANTERIOR CERVICAL DECOMPRESSION FUSION WITH INSTRUMENTATION AND ALLOGRAFT;  Surgeon: Phylliss Bob, MD;  Location: [place];  Service: Orthopedics;  Laterality: N/A;   BREAST BIOPSY Left    u/s guided core biopsy   BUNIONECTOMY Bilateral    many years ago   CESAREAN SECTION  09/22/1980   COLONOSCOPY     ESOPHAGOGASTRODUODENOSCOPY     INCISION AND DRAINAGE ABSCESS N/A 07/20/2012    Procedure: INCISION AND DRAINAGE ABSCESS;  Surgeon: Ralene Ok, MD;  Location: [place];  Service: General;  Laterality: N/A;   KNEE ARTHROSCOPY W/ MENISCAL REPAIR Left 2001   LAPAROSCOPY FOR ECTOPIC PREGNANCY  ~ 1987   left shoulder bone "scraping" Left 2017   REPLACEMENT TOTAL KNEE Left 2009   ruptured disc     TONSILLECTOMY     removed as a child   TUBAL LIGATION  ~ 1987   Patient Active Problem List   Diagnosis Date Noted   Palpitations 05/16/2018   Essential hypertension 05/16/2018   Radiculopathy 06/29/2017   MRSA (methicillin resistant Staphylococcus aureus) 07/23/2012   Abscess of back 07/19/2012    REFERRING DIAG: REFERRING DIAG: R26.89 (ICD-10-CM) - Other abnormalities of gait and mobility   THERAPY DIAG:  No diagnosis found.  Rationale for Evaluation and Treatment Rehabilitation  PERTINENT HISTORY: PERTINENT HISTORY: L vestibular schwannoma, GERD, HTN, migraines, chronic LBP, arthritis, depression, sleep apnea, DM, ACDF 03/2021   PRECAUTIONS: PRECAUTIONS: Fall and Other: No lifting >10#   SUBJECTIVE: Some pain in the back where received a shot last week, still feeling unsteady when walking. No falls recently  PAIN:  Are you having pain? Yes: NPRS scale: 7/10 Pain location: low back/buttocks Pain description: aching, sore Aggravating factors:  prolonged standing, walking Relieving factors: sitting   OBJECTIVE:     TODAY'S TREATMENT: 07/23/21 Activity Comments  Reviewed HEP-see below Min cues for UE support as needed for stability  Sit<>stand, no UE support, 3x5   Tandem stance 2x30 sec  Intermittent UE support and cues for core engagement to limite trunk sway  Standing hip abduction 3x10 UE support and cues for abdominal activation, technique, for trunk stability  Alt step taps, to 6" step, 3 x 10 reps, BUE support>1 UE support Cues for abdominal activation to lessen excess anterior pelvic tilt and retropulsion moment at shoulders  Forward step ups, 3x5  reps, each leg leading BUE support and cues for abdominal activation 6"  Sidestep x 2 min with parallel bar To improve lateral maneuver and loading response  Gait training with rollator and supervision to improve safety and performance Cues for abdominal activation and upright posture with gait.  Standing wide BOS ant/posterior weightshift through hips for low back, hamstring stretch 3x10 sec each position Cues for technique  Seated piriformis stretch,each side, 3 x 30sec Does not feel stretch and demonstrates very flexible/hypermobile hip joint movements  NU-step level 5 x 7 min, steps/min 60-80  Cues for abdominal activation, upright posture                      PATIENT EDUCATION: Education details: Reinforced rationale of using rollator/AD for gait as she is categorized as high risk for falls and demonstrates gait deviations  Person educated: Patient  Education method: Explanation Education comprehension: verbalized understanding     GOALS: Goals reviewed with patient? Yes   SHORT TERM GOALS: Target date: 08/10/2021    Pt will be independent with HEP to address strength, balance, gait for improved mobility and decreased fall risk. Baseline: Goal status:IN PROGRESS   2.  Pt will improve 5x sit<>stand to less than or equal to 15 sec to demonstrate improved functional strength and transfer efficiency.  Baseline: 19.07 sec Goal status: IN PROGRESS   3.  Further vestibular testing as needed. Baseline:  Goal status: IN PROGRESS   LONG TERM GOALS: Target date: 09/07/2021     Pt will be independent with progression of HEP to address strength, balance, gait for improved mobility and decreased fall risk. Baseline:  Goal status: IN PROGRESS   2.  Pt will improve 5x sit<>stand to less than or equal to 11.5 sec to demonstrate improved functional strength and transfer efficiency.   Baseline:  Goal status: IN PROGRESS   3.  Pt will improve TUG score to less than or equal to 20 sec for  decreased fall risk.   Baseline: 27.31 sec Goal status: IN PROGRESS   4.  Berg Balance test to be assessed, with pt to improve by at least 5 points for decreased fall risk. Baseline:  Goal status: IN PROGRESS   5.  Pt will improve gait velocity to at least 2 ft/sec for improved gait efficiency and safety.   Baseline: 1.73 ft/sec Goal status: IN PROGRESS   ASSESSMENT:   CLINICAL IMPRESSION: Tolerating session well with minimal need for rest periods between sets/exercises. Demonstrates significant unsteadiness with frequent LOB especially when distracted and turning head with lateral LOB most prevalent.  Does not feel any effect from seated piriformis stretch and demonstrates quite hypermobile hip joint movements and recommend to discontinue this stretch if she doesn't feel any tension. Pt encouraged to perform daily walks with supervision and use of rollator with emphasis on use of  a track or other flat, level ground to improve endurance and safety with gait. Continued sessions to improve balance, strength, coordination, and reduce risk for falls   OBJECTIVE IMPAIRMENTS Abnormal gait, decreased activity tolerance, decreased balance, decreased knowledge of use of DME, decreased mobility, difficulty walking, decreased ROM, decreased strength, impaired flexibility, and postural dysfunction.    ACTIVITY LIMITATIONS carrying, bending, standing, squatting, stairs, transfers, and locomotion level   PARTICIPATION LIMITATIONS: meal prep, cleaning, laundry, shopping, community activity, and occupation   PERSONAL FACTORS 3+ comorbidities: See PMH above/problem list  are also affecting patient's functional outcome.    REHAB POTENTIAL: Good   CLINICAL DECISION MAKING: Evolving/moderate complexity   EVALUATION COMPLEXITY: Moderate     PLAN: PT FREQUENCY: 2x/week   PT DURATION: 8 weeks   PLANNED INTERVENTIONS: Therapeutic exercises, Therapeutic activity, Neuromuscular re-education, Balance  training, Gait training, Patient/Family education, Joint mobilization, Stair training, Vestibular training, Canalith repositioning, DME instructions, and Manual therapy   PLAN FOR NEXT SESSION: Add to HEP to address core, hip strengthening; balance training.   Gait training in PT session with rollator and continue to encourage pt to use at home.  Home Exercise Program Access Code: [email or web address] URL: [email or web address] Date: 07/16/2021 Prepared by: Sherlyn Lees  Exercises - Sit to Stand Without Arm Support  - 1 x daily - 7 x weekly - 3 sets - 5 reps - Tandem Stance with Support  - 1 x daily - 7 x weekly - 2 sets - 30 sec hold - Standing Hip Abduction with Counter Support  - 1 x daily - 7 x weekly - 3 sets - 10 reps  8:45 AM, 07/23/21 M. Sherlyn Lees, PT, DPT Physical Therapist- [place] Office Number: [phone]

## 2021-07-27 DIAGNOSIS — J439 Emphysema, unspecified: Secondary | ICD-10-CM | POA: Diagnosis not present

## 2021-07-27 DIAGNOSIS — G4733 Obstructive sleep apnea (adult) (pediatric): Secondary | ICD-10-CM | POA: Diagnosis not present

## 2021-07-27 DIAGNOSIS — E1169 Type 2 diabetes mellitus with other specified complication: Secondary | ICD-10-CM | POA: Diagnosis not present

## 2021-07-27 DIAGNOSIS — I2510 Atherosclerotic heart disease of native coronary artery without angina pectoris: Secondary | ICD-10-CM | POA: Diagnosis not present

## 2021-07-27 DIAGNOSIS — N3281 Overactive bladder: Secondary | ICD-10-CM | POA: Diagnosis not present

## 2021-07-27 DIAGNOSIS — R202 Paresthesia of skin: Secondary | ICD-10-CM | POA: Diagnosis not present

## 2021-07-27 DIAGNOSIS — M545 Low back pain, unspecified: Secondary | ICD-10-CM | POA: Diagnosis not present

## 2021-07-27 DIAGNOSIS — Z6825 Body mass index (BMI) 25.0-25.9, adult: Secondary | ICD-10-CM | POA: Diagnosis not present

## 2021-07-28 ENCOUNTER — Ambulatory Visit: Payer: Medicare Other

## 2021-07-28 DIAGNOSIS — R2689 Other abnormalities of gait and mobility: Secondary | ICD-10-CM

## 2021-07-28 DIAGNOSIS — M6281 Muscle weakness (generalized): Secondary | ICD-10-CM

## 2021-07-28 DIAGNOSIS — R2681 Unsteadiness on feet: Secondary | ICD-10-CM | POA: Diagnosis not present

## 2021-07-28 NOTE — Therapy (Signed)
OUTPATIENT PHYSICAL THERAPY TREATMENT NOTE   Patient Name: Karla Baker MRN: [id number] DOB:April 23, 1960, 61 y.o., female Today's Date: 07/28/2021  PCP: PCP: Iona Beard, MD  REFERRING PROVIDER: REFERRING PROVIDER: Lavonia Dana, MD  END OF SESSION:   PT End of Session - 07/28/21 0850     Visit Number 5    Number of Visits 17    Date for PT Re-Evaluation 09/07/21    Authorization Type Medicare    [place] Note Due on Visit 10    PT Start Time 0847    PT Stop Time 0930    PT Time Calculation (min) 43 min    Equipment Utilized During Treatment Gait belt    Activity Tolerance Patient tolerated treatment well    Behavior During Therapy WFL for tasks assessed/performed              Past Medical History:  Diagnosis Date   Arthritis    "knees and hands" (07/19/2012)   Boil, back    "popped it 07/08/2012 & it got infected" (07/19/2012)   Chronic lower back pain    Depression    Essential hypertension 05/16/2018   GERD (gastroesophageal reflux disease)    High cholesterol    "don't think I take the RX anymore" (07/19/2012)   Hypertension    Hyperthyroidism    "overactive; never put me on anything for it" (07/19/2012)   Migraines    "only when I took BCP" (07/19/2012)   Palpitations 05/16/2018   Pneumonia    "a couple times" (07/19/2012)   Sleep apnea    does not wear CPAP   Type II diabetes mellitus ([place])    Past Surgical History:  Procedure Laterality Date   ANTERIOR CERVICAL DECOMP/DISCECTOMY FUSION N/A 03/25/2021   Procedure: CERVICALTHREE- CERVICAL FOUR, CERVICAL FOUR- CERVICAL FIVE ANTERIOR CERVICAL DECOMPRESSION FUSION WITH INSTRUMENTATION AND ALLOGRAFT;  Surgeon: Phylliss Bob, MD;  Location: [place];  Service: Orthopedics;  Laterality: N/A;   BREAST BIOPSY Left    u/s guided core biopsy   BUNIONECTOMY Bilateral    many years ago   CESAREAN SECTION  09/22/1980   COLONOSCOPY     ESOPHAGOGASTRODUODENOSCOPY     INCISION AND DRAINAGE ABSCESS N/A 07/20/2012    Procedure: INCISION AND DRAINAGE ABSCESS;  Surgeon: Ralene Ok, MD;  Location: [facility];  Service: General;  Laterality: N/A;   KNEE ARTHROSCOPY W/ MENISCAL REPAIR Left 2001   LAPAROSCOPY FOR ECTOPIC PREGNANCY  ~ 1987   left shoulder bone "scraping" Left 2017   REPLACEMENT TOTAL KNEE Left 2009   ruptured disc     TONSILLECTOMY     removed as a child   TUBAL LIGATION  ~ 1987   Patient Active Problem List   Diagnosis Date Noted   Palpitations 05/16/2018   Essential hypertension 05/16/2018   Radiculopathy 06/29/2017   MRSA (methicillin resistant Staphylococcus aureus) 07/23/2012   Abscess of back 07/19/2012    REFERRING DIAG: REFERRING DIAG: R26.89 (ICD-10-CM) - Other abnormalities of gait and mobility   THERAPY DIAG:  Other abnormalities of gait and mobility  Muscle weakness (generalized)  Unsteadiness on feet  Rationale for Evaluation and Treatment Rehabilitation  PERTINENT HISTORY: PERTINENT HISTORY: L vestibular schwannoma, GERD, HTN, migraines, chronic LBP, arthritis, depression, sleep apnea, DM, ACDF 03/2021   PRECAUTIONS: PRECAUTIONS: Fall and Other: No lifting >10#   SUBJECTIVE: Back and left leg are painful form sciatica.   PAIN:  Are you having pain? Yes: NPRS scale: 7/10 Pain location: low back/buttocks Pain description: aching, sore Aggravating  factors: prolonged standing, walking Relieving factors: sitting   OBJECTIVE:     TODAY'S TREATMENT: 07/28/21 Activity Comments  Seated LAQ, ankle pumps, hip add isometric 3x10  Performed for warm-up due to pain/discomfort  Sit<>stand, no UE support, 3x5   Tandem stance 2x30 sec  Intermittent UE support and cues for core engagement to limite trunk sway  Standing hip abduction 3x10 UE support and cues for abdominal activation, technique, for trunk stability  Alternating stair taps 8" with reciprocal arm swing 2x10 Cues for abdominal activation and sequence for opposite arm/leg   Forward step ups, 4x5 reps, each  leg leading to improve strength and stair negotiation BUE support and cues for abdominal activation 8"  Sidestep x 2 min with parallel bar To improve lateral maneuver and loading response  Gait training with rollator and supervision to improve safety and performance. Walking around obstacles and making turns Cues for abdominal activation and upright posture with gait and upright posture x 6 min without stop then 4 min   Retrowalking 2x2 min W/ rollator and uspervision                            PATIENT EDUCATION: Education details: Reinforced rationale of using rollator/AD for gait as she is categorized as high risk for falls and demonstrates gait deviations  Person educated: Patient  Education method: Explanation Education comprehension: verbalized understanding     GOALS: Goals reviewed with patient? Yes   SHORT TERM GOALS: Target date: 08/10/2021    Pt will be independent with HEP to address strength, balance, gait for improved mobility and decreased fall risk. Baseline: Goal status:IN PROGRESS   2.  Pt will improve 5x sit<>stand to less than or equal to 15 sec to demonstrate improved functional strength and transfer efficiency.  Baseline: 19.07 sec Goal status: IN PROGRESS   3.  Further vestibular testing as needed. Baseline:  Goal status: IN PROGRESS   LONG TERM GOALS: Target date: 09/07/2021     Pt will be independent with progression of HEP to address strength, balance, gait for improved mobility and decreased fall risk. Baseline:  Goal status: IN PROGRESS   2.  Pt will improve 5x sit<>stand to less than or equal to 11.5 sec to demonstrate improved functional strength and transfer efficiency.   Baseline:  Goal status: IN PROGRESS   3.  Pt will improve TUG score to less than or equal to 20 sec for decreased fall risk.   Baseline: 27.31 sec Goal status: IN PROGRESS   4.  Berg Balance test to be assessed, with pt to improve by at least 5 points for decreased fall  risk. Baseline:  Goal status: IN PROGRESS   5.  Pt will improve gait velocity to at least 2 ft/sec for improved gait efficiency and safety.   Baseline: 1.73 ft/sec Goal status: IN PROGRESS   ASSESSMENT:   CLINICAL IMPRESSION: Progressing with POC details and demonstrating improved LE strength as evidenced by ability to perform 8" step ups with improved posture and increased repetition.  Demonstrates decreased kinematics with gait LLE due to pain/guarding/weakness but overall tolerating activities of increased demand and complexity without adverse effects.  Difficulty with balance in narrow BOS requiring compensatory movements to maintain position and cues for relevant recruitment. Continued sessions to progress strength, dynamic balance, and safety/independence with gait   OBJECTIVE IMPAIRMENTS Abnormal gait, decreased activity tolerance, decreased balance, decreased knowledge of use of DME, decreased mobility, difficulty walking, decreased  ROM, decreased strength, impaired flexibility, and postural dysfunction.    ACTIVITY LIMITATIONS carrying, bending, standing, squatting, stairs, transfers, and locomotion level   PARTICIPATION LIMITATIONS: meal prep, cleaning, laundry, shopping, community activity, and occupation   PERSONAL FACTORS 3+ comorbidities: See PMH above/problem list  are also affecting patient's functional outcome.    REHAB POTENTIAL: Good   CLINICAL DECISION MAKING: Evolving/moderate complexity   EVALUATION COMPLEXITY: Moderate     PLAN: PT FREQUENCY: 2x/week   PT DURATION: 8 weeks   PLANNED INTERVENTIONS: Therapeutic exercises, Therapeutic activity, Neuromuscular re-education, Balance training, Gait training, Patient/Family education, Joint mobilization, Stair training, Vestibular training, Canalith repositioning, DME instructions, and Manual therapy   PLAN FOR NEXT SESSION: Add to HEP to address core, hip strengthening; balance training.   Gait training in PT  session with rollator and continue to encourage pt to use at home.  Home Exercise Program Access Code: [email or web address] URL: [email or web address] Date: 07/16/2021 Prepared by: Sherlyn Lees  Exercises - Sit to Stand Without Arm Support  - 1 x daily - 7 x weekly - 3 sets - 5 reps - Tandem Stance with Support  - 1 x daily - 7 x weekly - 2 sets - 30 sec hold - Standing Hip Abduction with Counter Support  - 1 x daily - 7 x weekly - 3 sets - 10 reps  8:50 AM, 07/28/21 M. Sherlyn Lees, PT, DPT Physical Therapist- [place] Office Number: [phone]

## 2021-07-30 ENCOUNTER — Ambulatory Visit: Payer: Medicare Other

## 2021-07-30 DIAGNOSIS — R2689 Other abnormalities of gait and mobility: Secondary | ICD-10-CM | POA: Diagnosis not present

## 2021-07-30 DIAGNOSIS — R2681 Unsteadiness on feet: Secondary | ICD-10-CM

## 2021-07-30 DIAGNOSIS — M6281 Muscle weakness (generalized): Secondary | ICD-10-CM | POA: Diagnosis not present

## 2021-07-30 NOTE — Therapy (Signed)
OUTPATIENT PHYSICAL THERAPY TREATMENT NOTE   Patient Name: Karla Baker MRN: [id number] DOB:[birth year], 61 y.o., female Today's Date: 07/30/2021  PCP: PCP: Iona Beard, MD  REFERRING PROVIDER: REFERRING PROVIDER: Lavonia Dana, MD  END OF SESSION:   PT End of Session - 07/30/21 0855     Visit Number 6    Number of Visits 17    Date for PT Re-Evaluation 09/07/21    Authorization Type Medicare    Progress Note Due on Visit 10    PT Start Time 0847    PT Stop Time 0930    PT Time Calculation (min) 43 min    Equipment Utilized During Treatment Gait belt    Activity Tolerance Patient tolerated treatment well    Behavior During Therapy WFL for tasks assessed/performed              Past Medical History:  Diagnosis Date   Arthritis    "knees and hands" (07/19/2012)   Boil, back    "popped it 07/08/2012 & it got infected" (07/19/2012)   Chronic lower back pain    Depression    Essential hypertension 05/16/2018   GERD (gastroesophageal reflux disease)    High cholesterol    "don't think I take the RX anymore" (07/19/2012)   Hypertension    Hyperthyroidism    "overactive; never put me on anything for it" (07/19/2012)   Migraines    "only when I took BCP" (07/19/2012)   Palpitations 05/16/2018   Pneumonia    "a couple times" (07/19/2012)   Sleep apnea    does not wear CPAP   Type II diabetes mellitus ([place])    Past Surgical History:  Procedure Laterality Date   ANTERIOR CERVICAL DECOMP/DISCECTOMY FUSION N/A 03/25/2021   Procedure: CERVICALTHREE- CERVICAL FOUR, CERVICAL FOUR- CERVICAL FIVE ANTERIOR CERVICAL DECOMPRESSION FUSION WITH INSTRUMENTATION AND ALLOGRAFT;  Surgeon: Phylliss Bob, MD;  Location: [facility];  Service: Orthopedics;  Laterality: N/A;   BREAST BIOPSY Left    u/s guided core biopsy   BUNIONECTOMY Bilateral    many years ago   CESAREAN SECTION  09/22/1980   COLONOSCOPY     ESOPHAGOGASTRODUODENOSCOPY     INCISION AND DRAINAGE ABSCESS N/A 07/20/2012    Procedure: INCISION AND DRAINAGE ABSCESS;  Surgeon: Ralene Ok, MD;  Location: [facility];  Service: General;  Laterality: N/A;   KNEE ARTHROSCOPY W/ MENISCAL REPAIR Left 2001   LAPAROSCOPY FOR ECTOPIC PREGNANCY  ~ 1987   left shoulder bone "scraping" Left 2017   REPLACEMENT TOTAL KNEE Left 2009   ruptured disc     TONSILLECTOMY     removed as a child   TUBAL LIGATION  ~ 1987   Patient Active Problem List   Diagnosis Date Noted   Palpitations 05/16/2018   Essential hypertension 05/16/2018   Radiculopathy 06/29/2017   MRSA (methicillin resistant Staphylococcus aureus) 07/23/2012   Abscess of back 07/19/2012    REFERRING DIAG: REFERRING DIAG: R26.89 (ICD-10-CM) - Other abnormalities of gait and mobility   THERAPY DIAG:  Other abnormalities of gait and mobility  Muscle weakness (generalized)  Unsteadiness on feet  Rationale for Evaluation and Treatment Rehabilitation  PERTINENT HISTORY: PERTINENT HISTORY: L vestibular schwannoma, GERD, HTN, migraines, chronic LBP, arthritis, depression, sleep apnea, DM, ACDF 03/2021   PRECAUTIONS: PRECAUTIONS: Fall and Other: No lifting >10#   SUBJECTIVE: Leg is feeling better, sciatica isn't as intense   PAIN:  Are you having pain? Yes: NPRS scale: 7/10 Pain location: low back/buttocks Pain description: aching, sore Aggravating  factors: prolonged standing, walking Relieving factors: sitting   OBJECTIVE:    TODAY'S TREATMENT: 07/30/21 Activity Comments  Standing heel raise/toe raise 2x10   Standing hip abduction 2x10   Tandem stance 2x30 sec left/right unsteady  Sit<>stand, no UE support, 4x5 15 sec rest between sets  stair taps 8" with reciprocal arm swing x 2 min   Forrward Step-ups 8" 3x5 ea foot leading Cues for glute recruit  Retro-walking no AD x 2 min CGA-unsteady  Sidestepping no AD x 2 min CGA-unsteady                        PATIENT EDUCATION: Education details: encouraged on increased activity during the week,  e.g. walking track at [facility] educated: Patient  Education method: Explanation Education comprehension: verbalized understanding     GOALS: Goals reviewed with patient? Yes   SHORT TERM GOALS: Target date: 08/10/2021    Pt will be independent with HEP to address strength, balance, gait for improved mobility and decreased fall risk. Baseline: Goal status:IN PROGRESS   2.  Pt will improve 5x sit<>stand to less than or equal to 15 sec to demonstrate improved functional strength and transfer efficiency.  Baseline: 19.07 sec Goal status: IN PROGRESS   3.  Further vestibular testing as needed. Baseline:  Goal status: IN PROGRESS   LONG TERM GOALS: Target date: 09/07/2021     Pt will be independent with progression of HEP to address strength, balance, gait for improved mobility and decreased fall risk. Baseline:  Goal status: IN PROGRESS   2.  Pt will improve 5x sit<>stand to less than or equal to 11.5 sec to demonstrate improved functional strength and transfer efficiency.   Baseline:  Goal status: IN PROGRESS   3.  Pt will improve TUG score to less than or equal to 20 sec for decreased fall risk.   Baseline: 27.31 sec Goal status: IN PROGRESS   4.  Berg Balance test to be assessed, with pt to improve by at least 5 points for decreased fall risk. Baseline:  Goal status: IN PROGRESS   5.  Pt will improve gait velocity to at least 2 ft/sec for improved gait efficiency and safety.   Baseline: 1.73 ft/sec Goal status: IN PROGRESS   ASSESSMENT:   CLINICAL IMPRESSION: Progressing with activity tolerance only requiring cumulative 5 min therapeutic rest periods during session today with ability to progress from activity to activity without as much fatigue. Demo LE weakness/unsteadiness toward end of session due to exertion with 5 instances of LOB during retro and sidestep walk. Continued sessions indicated to progress overall fitness, strength, and balance to reduce risk for  falls    OBJECTIVE IMPAIRMENTS Abnormal gait, decreased activity tolerance, decreased balance, decreased knowledge of use of DME, decreased mobility, difficulty walking, decreased ROM, decreased strength, impaired flexibility, and postural dysfunction.    ACTIVITY LIMITATIONS carrying, bending, standing, squatting, stairs, transfers, and locomotion level   PARTICIPATION LIMITATIONS: meal prep, cleaning, laundry, shopping, community activity, and occupation   PERSONAL FACTORS 3+ comorbidities: See PMH above/problem list  are also affecting patient's functional outcome.    REHAB POTENTIAL: Good   CLINICAL DECISION MAKING: Evolving/moderate complexity   EVALUATION COMPLEXITY: Moderate     PLAN: PT FREQUENCY: 2x/week   PT DURATION: 8 weeks   PLANNED INTERVENTIONS: Therapeutic exercises, Therapeutic activity, Neuromuscular re-education, Balance training, Gait training, Patient/Family education, Joint mobilization, Stair training, Vestibular training, Canalith repositioning, DME instructions, and Manual therapy   PLAN  FOR NEXT SESSION: Add to HEP to address core, hip strengthening; balance training.   Gait training in PT session with rollator and continue to encourage pt to use at home.  Home Exercise Program Access Code: [email or web address] URL: [email or web address] Date: 07/16/2021 Prepared by: Sherlyn Lees  Exercises - Sit to Stand Without Arm Support  - 1 x daily - 7 x weekly - 3 sets - 5 reps - Tandem Stance with Support  - 1 x daily - 7 x weekly - 2 sets - 30 sec hold - Standing Hip Abduction with Counter Support  - 1 x daily - 7 x weekly - 3 sets - 10 reps  8:55 AM, 07/30/21 M. Sherlyn Lees, PT, DPT Physical Therapist- [place] Office Number: [phone]

## 2021-08-03 ENCOUNTER — Ambulatory Visit: Payer: Medicare Other | Admitting: Physical Therapy

## 2021-08-03 ENCOUNTER — Encounter: Payer: Self-pay | Admitting: Physical Therapy

## 2021-08-03 DIAGNOSIS — R2689 Other abnormalities of gait and mobility: Secondary | ICD-10-CM

## 2021-08-03 DIAGNOSIS — R2681 Unsteadiness on feet: Secondary | ICD-10-CM

## 2021-08-03 DIAGNOSIS — M6281 Muscle weakness (generalized): Secondary | ICD-10-CM | POA: Diagnosis not present

## 2021-08-03 NOTE — Therapy (Signed)
OUTPATIENT PHYSICAL THERAPY TREATMENT NOTE   Patient Name: CASSIDEE DEATS MRN: [id number] DOB:1961-01-01, 61 y.o., female Today's Date: 08/03/2021  PCP: PCP: Iona Beard, MD  REFERRING PROVIDER: REFERRING PROVIDER: Lavonia Dana, MD  END OF SESSION:   PT End of Session - 08/03/21 0851     Visit Number 7    Number of Visits 17    Date for PT Re-Evaluation 09/07/21    Authorization Type Medicare    Progress Note Due on Visit 10    PT Start Time [phone]    PT Stop Time 0930    PT Time Calculation (min) 39 min    Equipment Utilized During Treatment Gait belt    Activity Tolerance Patient tolerated treatment well    Behavior During Therapy WFL for tasks assessed/performed               Past Medical History:  Diagnosis Date   Arthritis    "knees and hands" (07/19/2012)   Boil, back    "popped it 07/08/2012 & it got infected" (07/19/2012)   Chronic lower back pain    Depression    Essential hypertension 05/16/2018   GERD (gastroesophageal reflux disease)    High cholesterol    "don't think I take the RX anymore" (07/19/2012)   Hypertension    Hyperthyroidism    "overactive; never put me on anything for it" (07/19/2012)   Migraines    "only when I took BCP" (07/19/2012)   Palpitations 05/16/2018   Pneumonia    "a couple times" (07/19/2012)   Sleep apnea    does not wear CPAP   Type II diabetes mellitus ([place])    Past Surgical History:  Procedure Laterality Date   ANTERIOR CERVICAL DECOMP/DISCECTOMY FUSION N/A 03/25/2021   Procedure: CERVICALTHREE- CERVICAL FOUR, CERVICAL FOUR- CERVICAL FIVE ANTERIOR CERVICAL DECOMPRESSION FUSION WITH INSTRUMENTATION AND ALLOGRAFT;  Surgeon: Phylliss Bob, MD;  Location: [facility];  Service: Orthopedics;  Laterality: N/A;   BREAST BIOPSY Left    u/s guided core biopsy   BUNIONECTOMY Bilateral    many years ago   CESAREAN SECTION  09/22/1980   COLONOSCOPY     ESOPHAGOGASTRODUODENOSCOPY     INCISION AND DRAINAGE ABSCESS N/A  07/20/2012   Procedure: INCISION AND DRAINAGE ABSCESS;  Surgeon: Ralene Ok, MD;  Location: [facility];  Service: General;  Laterality: N/A;   KNEE ARTHROSCOPY W/ MENISCAL REPAIR Left 2001   LAPAROSCOPY FOR ECTOPIC PREGNANCY  ~ 1987   left shoulder bone "scraping" Left 2017   REPLACEMENT TOTAL KNEE Left 2009   ruptured disc     TONSILLECTOMY     removed as a child   TUBAL LIGATION  ~ 1987   Patient Active Problem List   Diagnosis Date Noted   Palpitations 05/16/2018   Essential hypertension 05/16/2018   Radiculopathy 06/29/2017   MRSA (methicillin resistant Staphylococcus aureus) 07/23/2012   Abscess of back 07/19/2012    REFERRING DIAG: REFERRING DIAG: R26.89 (ICD-10-CM) - Other abnormalities of gait and mobility   THERAPY DIAG:  Other abnormalities of gait and mobility  Muscle weakness (generalized)  Unsteadiness on feet  Rationale for Evaluation and Treatment Rehabilitation  PERTINENT HISTORY: PERTINENT HISTORY: L vestibular schwannoma, GERD, HTN, migraines, chronic LBP, arthritis, depression, sleep apnea, DM, ACDF 03/2021   PRECAUTIONS: PRECAUTIONS: Fall and Other: No lifting >10#   SUBJECTIVE: Had a little pain this morning, but not hurting now. No changes, no pain.  PAIN:  Are you having pain? Yes: NPRS scale: 5/10 Pain location: low  back/buttocks Pain description: aching, sore Aggravating factors: prolonged standing, walking Relieving factors: sitting   OBJECTIVE:   TODAY'S TREATMENT: 08/03/2021 Activity Comments  NuStep, Level 4, 4 extremities x 6 minutes, for warm up, flexibility and strength   Alt step taps to 4" step, light BUE support, 2 x 10 reps BUE support>1 UE support  Minisquats x 10 reps, minisquats to up on toes x 10 reps Tactile cues to prevent R knee recurvatum  Forward step ups at 4" step, 10 reps, then at 6" step BUE support and cues for abdominal activation  SEated exercises RLE: hamstring stretch, 3 x 30 sec Hamstring curl, red theraband  resistance, 3 x 10 reps LAQ, 2 x 10 reps   Sit to stand from mat surface: 10 reps no UE support 5 reps standing on Airex Cues for slowed pace and control, to lessen R knee recurvatum                         PATIENT EDUCATION: Education details: Pt reports back about Dr. Laurena Bering visit last week; asks about possibility of OT here at this office.  (PT faxed request after PT eval, but will refax again)  Pt in agreement. Person educated: Patient Education method: Explanation Education comprehension: verbalized understanding    GOALS: Goals reviewed with patient? Yes   SHORT TERM GOALS: Target date: 08/10/2021    Pt will be independent with HEP to address strength, balance, gait for improved mobility and decreased fall risk. Baseline: Goal status:IN PROGRESS   2.  Pt will improve 5x sit<>stand to less than or equal to 15 sec to demonstrate improved functional strength and transfer efficiency.  Baseline: 19.07 sec Goal status: IN PROGRESS   3.  Further vestibular testing as needed. Baseline:  Goal status: IN PROGRESS   LONG TERM GOALS: Target date: 09/07/2021     Pt will be independent with progression of HEP to address strength, balance, gait for improved mobility and decreased fall risk. Baseline:  Goal status: IN PROGRESS   2.  Pt will improve 5x sit<>stand to less than or equal to 11.5 sec to demonstrate improved functional strength and transfer efficiency.   Baseline:  Goal status: IN PROGRESS   3.  Pt will improve TUG score to less than or equal to 20 sec for decreased fall risk.   Baseline: 27.31 sec Goal status: IN PROGRESS   4.  Berg Balance test to be assessed, with pt to improve by at least 5 points for decreased fall risk. Baseline:  Goal status: IN PROGRESS   5.  Pt will improve gait velocity to at least 2 ft/sec for improved gait efficiency and safety.   Baseline: 1.73 ft/sec Goal status: IN PROGRESS   ASSESSMENT:   CLINICAL IMPRESSION: Skilled  PT session focused today on seated and standing exercise for RLE strengthening, particular focus on attention to decreasing R knee recurvatum.  Pt responds well to cues for slowed mobility patterns with sit<>stand, squats and step taps for better R knee control; however, with gait between activities, pt continues to demonstrate R knee recurvatum and decreased R foot clearance.  May benefit from trial of foot-up brace and heel wedge in R shoe to improve gait pattern and will plan to try next visit.  OBJECTIVE IMPAIRMENTS Abnormal gait, decreased activity tolerance, decreased balance, decreased knowledge of use of DME, decreased mobility, difficulty walking, decreased ROM, decreased strength, impaired flexibility, and postural dysfunction.    ACTIVITY LIMITATIONS carrying, bending,  standing, squatting, stairs, transfers, and locomotion level   PARTICIPATION LIMITATIONS: meal prep, cleaning, laundry, shopping, community activity, and occupation   PERSONAL FACTORS 3+ comorbidities: See PMH above/problem list  are also affecting patient's functional outcome.    REHAB POTENTIAL: Good   CLINICAL DECISION MAKING: Evolving/moderate complexity   EVALUATION COMPLEXITY: Moderate     PLAN: PT FREQUENCY: 2x/week   PT DURATION: 8 weeks   PLANNED INTERVENTIONS: Therapeutic exercises, Therapeutic activity, Neuromuscular re-education, Balance training, Gait training, Patient/Family education, Joint mobilization, Stair training, Vestibular training, Canalith repositioning, DME instructions, and Manual therapy   PLAN FOR NEXT SESSION: Balance training, trial of foot-up brace with wedge RLE.  Add to HEP as needed to address core, hip strengthening; balance training.   PT to resend order request for OT to Dr. Lynann Bologna.  Home Exercise Program Access Code: [email or web address] URL: [email or web address] Date: 07/16/2021 Prepared by: Sherlyn Lees  Exercises - Sit to Stand Without Arm Support  - 1 x daily -  7 x weekly - 3 sets - 5 reps - Tandem Stance with Support  - 1 x daily - 7 x weekly - 2 sets - 30 sec hold - Standing Hip Abduction with Counter Support  - 1 x daily - 7 x weekly - 3 sets - 10 reps  Mady Haagensen, PT 08/03/21 12:02 PM Phone: [phone] Fax: [phone]

## 2021-08-06 ENCOUNTER — Ambulatory Visit: Payer: Medicare Other | Admitting: Physical Therapy

## 2021-08-06 ENCOUNTER — Encounter: Payer: Self-pay | Admitting: Physical Therapy

## 2021-08-06 DIAGNOSIS — R2681 Unsteadiness on feet: Secondary | ICD-10-CM | POA: Diagnosis not present

## 2021-08-06 DIAGNOSIS — M6281 Muscle weakness (generalized): Secondary | ICD-10-CM | POA: Diagnosis not present

## 2021-08-06 DIAGNOSIS — R2689 Other abnormalities of gait and mobility: Secondary | ICD-10-CM | POA: Diagnosis not present

## 2021-08-06 NOTE — Therapy (Signed)
OUTPATIENT PHYSICAL THERAPY TREATMENT NOTE   Patient Name: Karla Baker MRN: [id number] DOB:Apr 24, 1960, 61 y.o., female Today's Date: 08/06/2021  PCP: PCP: Iona Beard, MD  REFERRING PROVIDER: REFERRING PROVIDER: Lavonia Dana, MD  END OF SESSION:   PT End of Session - 08/06/21 0846     Visit Number 8    Number of Visits 17    Date for PT Re-Evaluation 09/07/21    Authorization Type Medicare    Progress Note Due on Visit 10    PT Start Time 0846    PT Stop Time 0926    PT Time Calculation (min) 40 min    Equipment Utilized During Treatment Gait belt    Activity Tolerance Patient tolerated treatment well    Behavior During Therapy WFL for tasks assessed/performed                Past Medical History:  Diagnosis Date   Arthritis    "knees and hands" (07/19/2012)   Boil, back    "popped it 07/08/2012 & it got infected" (07/19/2012)   Chronic lower back pain    Depression    Essential hypertension 05/16/2018   GERD (gastroesophageal reflux disease)    High cholesterol    "don't think I take the RX anymore" (07/19/2012)   Hypertension    Hyperthyroidism    "overactive; never put me on anything for it" (07/19/2012)   Migraines    "only when I took BCP" (07/19/2012)   Palpitations 05/16/2018   Pneumonia    "a couple times" (07/19/2012)   Sleep apnea    does not wear CPAP   Type II diabetes mellitus ([place])    Past Surgical History:  Procedure Laterality Date   ANTERIOR CERVICAL DECOMP/DISCECTOMY FUSION N/A 03/25/2021   Procedure: CERVICALTHREE- CERVICAL FOUR, CERVICAL FOUR- CERVICAL FIVE ANTERIOR CERVICAL DECOMPRESSION FUSION WITH INSTRUMENTATION AND ALLOGRAFT;  Surgeon: Phylliss Bob, MD;  Location: [facility];  Service: Orthopedics;  Laterality: N/A;   BREAST BIOPSY Left    u/s guided core biopsy   BUNIONECTOMY Bilateral    many years ago   CESAREAN SECTION  09/22/1980   COLONOSCOPY     ESOPHAGOGASTRODUODENOSCOPY     INCISION AND DRAINAGE ABSCESS N/A  07/20/2012   Procedure: INCISION AND DRAINAGE ABSCESS;  Surgeon: Ralene Ok, MD;  Location: [facility];  Service: General;  Laterality: N/A;   KNEE ARTHROSCOPY W/ MENISCAL REPAIR Left 2001   LAPAROSCOPY FOR ECTOPIC PREGNANCY  ~ 1987   left shoulder bone "scraping" Left 2017   REPLACEMENT TOTAL KNEE Left 2009   ruptured disc     TONSILLECTOMY     removed as a child   TUBAL LIGATION  ~ 1987   Patient Active Problem List   Diagnosis Date Noted   Palpitations 05/16/2018   Essential hypertension 05/16/2018   Radiculopathy 06/29/2017   MRSA (methicillin resistant Staphylococcus aureus) 07/23/2012   Abscess of back 07/19/2012    REFERRING DIAG: REFERRING DIAG: R26.89 (ICD-10-CM) - Other abnormalities of gait and mobility   THERAPY DIAG:  Unsteadiness on feet  Other abnormalities of gait and mobility  Rationale for Evaluation and Treatment Rehabilitation  PERTINENT HISTORY: PERTINENT HISTORY: L vestibular schwannoma, GERD, HTN, migraines, chronic LBP, arthritis, depression, sleep apnea, DM, ACDF 03/2021   PRECAUTIONS: PRECAUTIONS: Fall and Other: No lifting >10#   SUBJECTIVE: No changes, nothing new.  Had a good visit with Dr. Lynann Bologna last week, but I'm not sure he specifically mentioned OT. (Discussed pt following up with MD about OT order)  PAIN:  Are you having pain? Yes: NPRS scale: 4/10 Pain location: low back/buttocks Pain description: aching, sore Aggravating factors: prolonged standing, walking Relieving factors: sitting   OBJECTIVE:    TODAY'S TREATMENT: 08/06/2021 Activity Comments  Gait training with foot-up brace/heel wedge on RLE, 200 ft, rollator, no episode of foot catching PT dons foot up brace and heel wedge on RLE.  With foot up brace engaged, improved foot clearance, decreased R knee recurvatum noted.  With it disengaged, 2 immediate episodes of R foot drag.    Discussed benefits of foot up brace and heel wedge for improved gait pattern and discussed how pt  can obtain   Gait training 500 ft, indoor/outdoor surfaces with rollator, using foot up and wedge RLE Pt reports good control and foot clearance throughout  Terminal knee extension RLE, 2 x 10 reps with red theraband Cues for technique  RLE as stance with LLE step taps to 4" block, then LLE hip/knee extension for R knee control, 10 reps, 5 reps   Sidestep squats x 10 reps each direction                              PATIENT EDUCATION: Education details: Benefits of using R foot-up brace and heel-wedge Person educated: Patient Education method: Explanation Education comprehension: verbalized understanding    GOALS: Goals reviewed with patient? Yes   SHORT TERM GOALS: Target date: 08/10/2021    Pt will be independent with HEP to address strength, balance, gait for improved mobility and decreased fall risk. Baseline: Goal status:IN PROGRESS   2.  Pt will improve 5x sit<>stand to less than or equal to 15 sec to demonstrate improved functional strength and transfer efficiency.  Baseline: 19.07 sec Goal status: IN PROGRESS   3.  Further vestibular testing as needed. Baseline:  Goal status: IN PROGRESS   LONG TERM GOALS: Target date: 09/07/2021     Pt will be independent with progression of HEP to address strength, balance, gait for improved mobility and decreased fall risk. Baseline:  Goal status: IN PROGRESS   2.  Pt will improve 5x sit<>stand to less than or equal to 11.5 sec to demonstrate improved functional strength and transfer efficiency.   Baseline:  Goal status: IN PROGRESS   3.  Pt will improve TUG score to less than or equal to 20 sec for decreased fall risk.   Baseline: 27.31 sec Goal status: IN PROGRESS   4.  Berg Balance test to be assessed, with pt to improve by at least 5 points for decreased fall risk. Baseline:  Goal status: IN PROGRESS   5.  Pt will improve gait velocity to at least 2 ft/sec for improved gait efficiency and safety.   Baseline:  1.73 ft/sec Goal status: IN PROGRESS   ASSESSMENT:   CLINICAL IMPRESSION: Skilled PT session focused on gait training using foot up brace and heel wedge on RLE.  Pt demo improved foot clearance, heelstrike, decreased R knee recurvatum.  Discussed benefits of using this for improved gait mechanics, and pt in agreement to obtain for home use.  Worked on additional standing NMR to help lessen R knee recurvatum and improve stability through RLE SLS.  She will continue to benefit from skilled PT towards goals for improved overall functional mobility.  OBJECTIVE IMPAIRMENTS Abnormal gait, decreased activity tolerance, decreased balance, decreased knowledge of use of DME, decreased mobility, difficulty walking, decreased ROM, decreased strength, impaired flexibility, and postural dysfunction.  ACTIVITY LIMITATIONS carrying, bending, standing, squatting, stairs, transfers, and locomotion level   PARTICIPATION LIMITATIONS: meal prep, cleaning, laundry, shopping, community activity, and occupation   PERSONAL FACTORS 3+ comorbidities: See PMH above/problem list  are also affecting patient's functional outcome.    REHAB POTENTIAL: Good   CLINICAL DECISION MAKING: Evolving/moderate complexity   EVALUATION COMPLEXITY: Moderate     PLAN: PT FREQUENCY: 2x/week   PT DURATION: 8 weeks   PLANNED INTERVENTIONS: Therapeutic exercises, Therapeutic activity, Neuromuscular re-education, Balance training, Gait training, Patient/Family education, Joint mobilization, Stair training, Vestibular training, Canalith repositioning, DME instructions, and Manual therapy   PLAN FOR NEXT SESSION: Check STGs.  Balance training, trial again foot-up brace with wedge RLE.  Add to HEP as needed to address core, hip strengthening; balance training.     Home Exercise Program Access Code: [email or web address] URL: [email or web address] Date: 07/16/2021 Prepared by: Sherlyn Lees  Exercises - Sit to Stand Without Arm  Support  - 1 x daily - 7 x weekly - 3 sets - 5 reps - Tandem Stance with Support  - 1 x daily - 7 x weekly - 2 sets - 30 sec hold - Standing Hip Abduction with Counter Support  - 1 x daily - 7 x weekly - 3 sets - 10 reps  Mady Haagensen, PT 08/06/21 9:31 AM Phone: [phone] Fax: [phone]

## 2021-08-10 ENCOUNTER — Encounter: Payer: Self-pay | Admitting: Physical Therapy

## 2021-08-10 ENCOUNTER — Ambulatory Visit: Payer: Medicare Other | Admitting: Physical Therapy

## 2021-08-10 DIAGNOSIS — R2689 Other abnormalities of gait and mobility: Secondary | ICD-10-CM

## 2021-08-10 DIAGNOSIS — R2681 Unsteadiness on feet: Secondary | ICD-10-CM

## 2021-08-10 DIAGNOSIS — M6281 Muscle weakness (generalized): Secondary | ICD-10-CM

## 2021-08-10 NOTE — Therapy (Signed)
OUTPATIENT PHYSICAL THERAPY TREATMENT NOTE/PROGRESS NOTE   Patient Name: Karla Baker MRN: [id number] DOB:September 15, 1960, 61 y.o., female Today's Date: 08/10/2021  PCP: PCP: Iona Beard, MD  REFERRING PROVIDER: REFERRING PROVIDER: Lavonia Dana, MD Progress Note Reporting Period 07/13/2021 to 08/10/2021  See note below for Objective Data and Assessment of Progress/Goals.     END OF SESSION:   PT End of Session - 08/10/21 0846     Visit Number 9    Number of Visits 17    Date for PT Re-Evaluation 09/07/21    Authorization Type Medicare    Progress Note Due on Visit 63    PT Start Time 0848    PT Stop Time 0929    PT Time Calculation (min) 41 min    Equipment Utilized During Treatment Gait belt    Activity Tolerance Patient tolerated treatment well    Behavior During Therapy WFL for tasks assessed/performed                 Past Medical History:  Diagnosis Date   Arthritis    "knees and hands" (07/19/2012)   Boil, back    "popped it 07/08/2012 & it got infected" (07/19/2012)   Chronic lower back pain    Depression    Essential hypertension 05/16/2018   GERD (gastroesophageal reflux disease)    High cholesterol    "don't think I take the RX anymore" (07/19/2012)   Hypertension    Hyperthyroidism    "overactive; never put me on anything for it" (07/19/2012)   Migraines    "only when I took BCP" (07/19/2012)   Palpitations 05/16/2018   Pneumonia    "a couple times" (07/19/2012)   Sleep apnea    does not wear CPAP   Type II diabetes mellitus ([place])    Past Surgical History:  Procedure Laterality Date   ANTERIOR CERVICAL DECOMP/DISCECTOMY FUSION N/A 03/25/2021   Procedure: CERVICALTHREE- CERVICAL FOUR, CERVICAL FOUR- CERVICAL FIVE ANTERIOR CERVICAL DECOMPRESSION FUSION WITH INSTRUMENTATION AND ALLOGRAFT;  Surgeon: Phylliss Bob, MD;  Location: [facility];  Service: Orthopedics;  Laterality: N/A;   BREAST BIOPSY Left    u/s guided core biopsy   BUNIONECTOMY  Bilateral    many years ago   CESAREAN SECTION  09/22/1980   COLONOSCOPY     ESOPHAGOGASTRODUODENOSCOPY     INCISION AND DRAINAGE ABSCESS N/A 07/20/2012   Procedure: INCISION AND DRAINAGE ABSCESS;  Surgeon: Ralene Ok, MD;  Location: [facility];  Service: General;  Laterality: N/A;   KNEE ARTHROSCOPY W/ MENISCAL REPAIR Left 2001   LAPAROSCOPY FOR ECTOPIC PREGNANCY  ~ 1987   left shoulder bone "scraping" Left 2017   REPLACEMENT TOTAL KNEE Left 2009   ruptured disc     TONSILLECTOMY     removed as a child   TUBAL LIGATION  ~ 1987   Patient Active Problem List   Diagnosis Date Noted   Palpitations 05/16/2018   Essential hypertension 05/16/2018   Radiculopathy 06/29/2017   MRSA (methicillin resistant Staphylococcus aureus) 07/23/2012   Abscess of back 07/19/2012    REFERRING DIAG: REFERRING DIAG: R26.89 (ICD-10-CM) - Other abnormalities of gait and mobility   THERAPY DIAG:  Unsteadiness on feet  Other abnormalities of gait and mobility  Muscle weakness (generalized)  Rationale for Evaluation and Treatment Rehabilitation  PERTINENT HISTORY: PERTINENT HISTORY: L vestibular schwannoma, GERD, HTN, migraines, chronic LBP, arthritis, depression, sleep apnea, DM, ACDF 03/2021   PRECAUTIONS: PRECAUTIONS: Fall and Other: No lifting >10#   SUBJECTIVE: I need the brace  fixed.  My husband and I worked on it, but I need you to fix it.  PAIN:  PAIN:  Are you having pain? No  OBJECTIVE:    TODAY'S TREATMENT: 08/10/2021 Activity Comments  Assisted patient in fitting the foot-up brace/donning correctly and educated/demo need for stretch to latch it for optimal benefit   Gait training with foot-up brace/heel wedge on RLE, 250 ft, rollator, 1 episode of foot catching Increased foot slap noted on RLE today  Terminal knee extension RLE,  10 reps with red theraband, then 2 x 10 reps with green theraband   Sidestep squats x 10 reps each direction Cues for technique, balance.  Pt tends to  bring feet very narrow upon return to midline-cues for wider BOS  Standing on Aire[employer]: Alternating forward step taps, x 10 reps Alternating back step taps, x 10 reps Alternating side step taps, x 10 reps Standing feet apart with head turns x 5, head nods x 5 eyes open-2 sets; then eyes closed head steady 2 x 10 sec  Cues for slowed control of knee flexion/extension in R knee, light UE support and cues for posture and abdominal activation -With side step taps, pt has excess trunk and pelvic movement and PT provides verbal and tactile cues for abdominal activation/upright trunk  Seated abdominal activation: Alt UE raises x 5 BUE raises x 5 reps Lower extremity marching x 5 reps Seated heel digs/glut sets x 10 reps Cues for abdominal activation reset between sets                           PATIENT EDUCATION: Education details: Reviewed donning/doffing proper technique of foot-up brace Person educated: Patient Education method: Explanation Education comprehension: verbalized understanding    GOALS: Goals reviewed with patient? Yes   SHORT TERM GOALS: Target date: 08/10/2021    Pt will be independent with HEP to address strength, balance, gait for improved mobility and decreased fall risk. Baseline: Goal status: GOAL MET 08/10/2021   2.  Pt will improve 5x sit<>stand to less than or equal to 15 sec to demonstrate improved functional strength and transfer efficiency.  Baseline: 19.07 sec>11.88 sec 08/10/2021 Goal status: GOAL MET 08/10/2021   3.  Further vestibular testing as needed. Baseline:  Goal status: GOAL DEFERRED-no c/o in sessions of vertigo/dizziness symptoms   LONG TERM GOALS: Target date: 09/07/2021     Pt will be independent with progression of HEP to address strength, balance, gait for improved mobility and decreased fall risk. Baseline:  Goal status: IN PROGRESS   2.  Pt will improve 5x sit<>stand to less than or equal to 11.5 sec to demonstrate improved functional  strength and transfer efficiency.   Baseline:  Goal status: IN PROGRESS   3.  Pt will improve TUG score to less than or equal to 20 sec for decreased fall risk.   Baseline: 27.31 sec Goal status: IN PROGRESS   4.  Berg Balance test to be assessed, with pt to improve by at least 5 points for decreased fall risk. Baseline: 42/56 Goal status: IN PROGRESS   5.  Pt will improve gait velocity to at least 2 ft/sec for improved gait efficiency and safety.   Baseline: 1.73 ft/sec Goal status: IN PROGRESS   ASSESSMENT:   CLINICAL IMPRESSION: PROGRESS NOTE-Subjectively, pt reports getting foot up brace for home, but not putting it in shoe correctly.  She reports on rainy days (like today), she is more likely to  have pain.  Objective measures:  5x sit<>stand:  11.88 sec (improved from 19.04 sec), gait velocity 2.37ft/sec with rollator (improved from 1.73 ft/sec)  Assessed STGs today, with pt meeting STG 1 and 2.  STG 3 deferred due to pt not having any specific dizziness or vertigo complaints.  Pt continues to benefit from skilled PT towards goals for improved overall improved functional mobility, balance, and independence, as pt continues to be at fall risk per TUG and Berg measures.   OBJECTIVE IMPAIRMENTS Abnormal gait, decreased activity tolerance, decreased balance, decreased knowledge of use of DME, decreased mobility, difficulty walking, decreased ROM, decreased strength, impaired flexibility, and postural dysfunction.    ACTIVITY LIMITATIONS carrying, bending, standing, squatting, stairs, transfers, and locomotion level   PARTICIPATION LIMITATIONS: meal prep, cleaning, laundry, shopping, community activity, and occupation   PERSONAL FACTORS 3+ comorbidities: See PMH above/problem list  are also affecting patient's functional outcome.    REHAB POTENTIAL: Good   CLINICAL DECISION MAKING: Evolving/moderate complexity   EVALUATION COMPLEXITY: Moderate     PLAN: PT FREQUENCY:  2x/week   PT DURATION: 8 weeks   PLANNED INTERVENTIONS: Therapeutic exercises, Therapeutic activity, Neuromuscular re-education, Balance training, Gait training, Patient/Family education, Joint mobilization, Stair training, Vestibular training, Canalith repositioning, DME instructions, and Manual therapy   PLAN FOR NEXT SESSION: Continue Balance training, use of  foot-up brace with wedge RLE.  Add to HEP as needed to address core, hip strengthening; balance training.     Home Exercise Program Access Code: [email or web address] URL: [email or web address] Date: 07/16/2021 Prepared by: Sherlyn Lees  Exercises - Sit to Stand Without Arm Support  - 1 x daily - 7 x weekly - 3 sets - 5 reps - Tandem Stance with Support  - 1 x daily - 7 x weekly - 2 sets - 30 sec hold - Standing Hip Abduction with Counter Support  - 1 x daily - 7 x weekly - 3 sets - 10 reps  Mady Haagensen, PT 08/10/21 10:41 AM Phone: [phone] Fax: [phone]

## 2021-08-13 ENCOUNTER — Encounter: Payer: Self-pay | Admitting: Physical Therapy

## 2021-08-13 ENCOUNTER — Ambulatory Visit: Payer: Medicare Other | Admitting: Physical Therapy

## 2021-08-13 DIAGNOSIS — R2689 Other abnormalities of gait and mobility: Secondary | ICD-10-CM

## 2021-08-13 DIAGNOSIS — R2681 Unsteadiness on feet: Secondary | ICD-10-CM | POA: Diagnosis not present

## 2021-08-13 DIAGNOSIS — M6281 Muscle weakness (generalized): Secondary | ICD-10-CM | POA: Diagnosis not present

## 2021-08-17 ENCOUNTER — Ambulatory Visit: Payer: Medicare Other

## 2021-08-18 ENCOUNTER — Ambulatory Visit: Payer: Medicare Other

## 2021-08-18 DIAGNOSIS — M6281 Muscle weakness (generalized): Secondary | ICD-10-CM | POA: Diagnosis not present

## 2021-08-18 DIAGNOSIS — R2681 Unsteadiness on feet: Secondary | ICD-10-CM | POA: Diagnosis not present

## 2021-08-18 DIAGNOSIS — R2689 Other abnormalities of gait and mobility: Secondary | ICD-10-CM

## 2021-08-18 NOTE — Therapy (Signed)
OUTPATIENT PHYSICAL THERAPY TREATMENT NOTE   Patient Name: Karla Baker MRN: [id number] DOB:1960-07-10, 61 y.o., female Today's Date: 08/18/2021  PCP: PCP: Mirna Mires, MD  REFERRING PROVIDER: REFERRING PROVIDER: Nigel Sloop, MD      END OF SESSION:   PT End of Session - 08/18/21 0846     Visit Number 11    Number of Visits 17    Date for PT Re-Evaluation 09/07/21    Authorization Type Medicare    Progress Note Due on Visit 19    PT Start Time 0845    PT Stop Time 0930    PT Time Calculation (min) 45 min    Equipment Utilized During Treatment Gait belt    Activity Tolerance Patient tolerated treatment well    Behavior During Therapy WFL for tasks assessed/performed                  Past Medical History:  Diagnosis Date   Arthritis    "knees and hands" (07/19/2012)   Boil, back    "popped it 07/08/2012 & it got infected" (07/19/2012)   Chronic lower back pain    Depression    Essential hypertension 05/16/2018   GERD (gastroesophageal reflux disease)    High cholesterol    "don't think I take the RX anymore" (07/19/2012)   Hypertension    Hyperthyroidism    "overactive; never put me on anything for it" (07/19/2012)   Migraines    "only when I took BCP" (07/19/2012)   Palpitations 05/16/2018   Pneumonia    "a couple times" (07/19/2012)   Sleep apnea    does not wear CPAP   Type II diabetes mellitus ([facility])    Past Surgical History:  Procedure Laterality Date   ANTERIOR CERVICAL DECOMP/DISCECTOMY FUSION N/A 03/25/2021   Procedure: CERVICALTHREE- CERVICAL FOUR, CERVICAL FOUR- CERVICAL FIVE ANTERIOR CERVICAL DECOMPRESSION FUSION WITH INSTRUMENTATION AND ALLOGRAFT;  Surgeon: Estill Bamberg, MD;  Location: [facility];  Service: Orthopedics;  Laterality: N/A;   BREAST BIOPSY Left    u/s guided core biopsy   BUNIONECTOMY Bilateral    many years ago   CESAREAN SECTION  09/22/1980   COLONOSCOPY     ESOPHAGOGASTRODUODENOSCOPY     INCISION AND DRAINAGE ABSCESS  N/A 07/20/2012   Procedure: INCISION AND DRAINAGE ABSCESS;  Surgeon: Axel Filler, MD;  Location: [facility];  Service: General;  Laterality: N/A;   KNEE ARTHROSCOPY W/ MENISCAL REPAIR Left 2001   LAPAROSCOPY FOR ECTOPIC PREGNANCY  ~ 1987   left shoulder bone "scraping" Left 2017   REPLACEMENT TOTAL KNEE Left 2009   ruptured disc     TONSILLECTOMY     removed as a child   TUBAL LIGATION  ~ 1987   Patient Active Problem List   Diagnosis Date Noted   Palpitations 05/16/2018   Essential hypertension 05/16/2018   Radiculopathy 06/29/2017   MRSA (methicillin resistant Staphylococcus aureus) 07/23/2012   Abscess of back 07/19/2012    REFERRING DIAG: REFERRING DIAG: R26.89 (ICD-10-CM) - Other abnormalities of gait and mobility   THERAPY DIAG:  Unsteadiness on feet  Other abnormalities of gait and mobility  Muscle weakness (generalized)  Rationale for Evaluation and Treatment Rehabilitation  PERTINENT HISTORY: PERTINENT HISTORY: L vestibular schwannoma, GERD, HTN, migraines, chronic LBP, arthritis, depression, sleep apnea, DM, ACDF 03/2021   PRECAUTIONS: PRECAUTIONS: Fall and Other: No lifting >10#   SUBJECTIVE: Not feeling too bad, improving in walking distance with rollator  PAIN:  Are you having pain? No  OBJECTIVE:  TODAY'S TREATMENT: 08/18/21 Activity Comments  Minisquats x 10 reps, then minisquats to up on toes   Standing resisted hip abduction 2 x 10 reps with red band;    Shoulder rolls forward and back, x 5 reps   TKE 2x10 with green t-band   In parallel bars:   Marching forward, 7 reps Forward/back tandem gait, 3 reps  Standing on foam: wide BOS EO/EC x 30 sec; narrow BOS EO/EC x 30 sec-- 2 sets Decrease in RLE weight shift noted when standing on foam  NU-step level 4 x 6 min To improve activity tolerance, cues 50% of the time for pacing intervals           Access Code: [email or web address] URL: [email or web address] Date: 08/13/2021 (Most recent  update) Prepared by: [facility]  Exercises - Sit to Stand Without Arm Support  - 1 x daily - 7 x weekly - 3 sets - 5 reps - Tandem Stance with Support  - 1 x daily - 7 x weekly - 2 sets - 30 sec hold - Standing Hip Abduction with Counter Support  - 1 x daily - 7 x weekly - 3 sets - 10 reps - 3 sec hold Added 08/13/2021 - Mini Squat with Counter Support  - 1 x daily - 5 x weekly - 3 sets - 10 reps - Standing Terminal Knee Extension with Resistance  - 1 x daily - 5 x weekly - 3 sets - 10 reps - 3 sec hold                 PATIENT EDUCATION: Education details: HEP updates-see above Person educated: Patient Education method: Explanation, Demonstration, and Handouts Education comprehension: verbalized understanding, returned demonstration, and needs further education     GOALS: Goals reviewed with patient? Yes   SHORT TERM GOALS: Target date: 08/10/2021    Pt will be independent with HEP to address strength, balance, gait for improved mobility and decreased fall risk. Baseline: Goal status: GOAL MET 08/10/2021   2.  Pt will improve 5x sit<>stand to less than or equal to 15 sec to demonstrate improved functional strength and transfer efficiency.  Baseline: 19.07 sec>11.88 sec 08/10/2021 Goal status: GOAL MET 08/10/2021   3.  Further vestibular testing as needed. Baseline:  Goal status: GOAL DEFERRED-no c/o in sessions of vertigo/dizziness symptoms   LONG TERM GOALS: Target date: 09/07/2021     Pt will be independent with progression of HEP to address strength, balance, gait for improved mobility and decreased fall risk. Baseline:  Goal status: IN PROGRESS   2.  Pt will improve 5x sit<>stand to less than or equal to 11.5 sec to demonstrate improved functional strength and transfer efficiency.   Baseline:  Goal status: IN PROGRESS   3.  Pt will improve TUG score to less than or equal to 20 sec for decreased fall risk.   Baseline: 27.31  sec Goal status: IN PROGRESS   4.  Berg Balance test to be assessed, with pt to improve by at least 5 points for decreased fall risk. Baseline: 42/56 Goal status: IN PROGRESS   5.  Pt will improve gait velocity to at least 2 ft/sec for improved gait efficiency and safety.   Baseline: 1.73 ft/sec Goal status: IN PROGRESS   ASSESSMENT:   CLINICAL IMPRESSION: Continued with session for HEP development with emphasis on gross strength, dynamic balance, and functional activity tolerance to increase independence and safety with community-level ambulation. Cues for  body mechanics when performing mass movements to improve hip engagement/recruitment. Static balance deficits on compliant surfaces especially with narrow BOS with decrease in RLE weight shift evident. Continued sessions to progress program and reduce risk for falls to improve safety with transfers and gait  OBJECTIVE IMPAIRMENTS Abnormal gait, decreased activity tolerance, decreased balance, decreased knowledge of use of DME, decreased mobility, difficulty walking, decreased ROM, decreased strength, impaired flexibility, and postural dysfunction.    ACTIVITY LIMITATIONS carrying, bending, standing, squatting, stairs, transfers, and locomotion level   PARTICIPATION LIMITATIONS: meal prep, cleaning, laundry, shopping, community activity, and occupation   PERSONAL FACTORS 3+ comorbidities: See PMH above/problem list  are also affecting patient's functional outcome.    REHAB POTENTIAL: Good   CLINICAL DECISION MAKING: Evolving/moderate complexity   EVALUATION COMPLEXITY: Moderate     PLAN: PT FREQUENCY: 2x/week   PT DURATION: 8 weeks   PLANNED INTERVENTIONS: Therapeutic exercises, Therapeutic activity, Neuromuscular re-education, Balance training, Gait training, Patient/Family education, Joint mobilization, Stair training, Vestibular training, Canalith repositioning, DME instructions, and Manual therapy   PLAN FOR NEXT SESSION:  Review updates to HEP (08/13/21)  Continue Balance training, use of  foot-up brace with wedge RLE.  Add to HEP as needed to address core, hip strengthening; balance training.      9:29 AM, 08/18/21 M. Sherlyn Lees, PT, DPT Physical Therapist- [place] Office Number: [phone]

## 2021-08-20 ENCOUNTER — Ambulatory Visit: Payer: Medicare Other

## 2021-08-20 DIAGNOSIS — R2689 Other abnormalities of gait and mobility: Secondary | ICD-10-CM | POA: Diagnosis not present

## 2021-08-20 DIAGNOSIS — R2681 Unsteadiness on feet: Secondary | ICD-10-CM | POA: Diagnosis not present

## 2021-08-20 DIAGNOSIS — M6281 Muscle weakness (generalized): Secondary | ICD-10-CM

## 2021-08-20 NOTE — Therapy (Signed)
OUTPATIENT PHYSICAL THERAPY TREATMENT NOTE   Patient Name: Karla Baker MRN: [id number] DOB:01-31-1961, 61 y.o., female Today's Date: 08/20/2021  PCP: PCP: Mirna Mires, MD  REFERRING PROVIDER: REFERRING PROVIDER: Nigel Sloop, MD      END OF SESSION:   PT End of Session - 08/20/21 0850     Visit Number 12    Number of Visits 17    Date for PT Re-Evaluation 09/07/21    Authorization Type Medicare    Progress Note Due on Visit 19    PT Start Time 0845    PT Stop Time 0930    PT Time Calculation (min) 45 min    Equipment Utilized During Treatment Gait belt    Activity Tolerance Patient tolerated treatment well    Behavior During Therapy WFL for tasks assessed/performed                  Past Medical History:  Diagnosis Date   Arthritis    "knees and hands" (07/19/2012)   Boil, back    "popped it 07/08/2012 & it got infected" (07/19/2012)   Chronic lower back pain    Depression    Essential hypertension 05/16/2018   GERD (gastroesophageal reflux disease)    High cholesterol    "don't think I take the RX anymore" (07/19/2012)   Hypertension    Hyperthyroidism    "overactive; never put me on anything for it" (07/19/2012)   Migraines    "only when I took BCP" (07/19/2012)   Palpitations 05/16/2018   Pneumonia    "a couple times" (07/19/2012)   Sleep apnea    does not wear CPAP   Type II diabetes mellitus (HCC)    Past Surgical History:  Procedure Laterality Date   ANTERIOR CERVICAL DECOMP/DISCECTOMY FUSION N/A 03/25/2021   Procedure: CERVICALTHREE- CERVICAL FOUR, CERVICAL FOUR- CERVICAL FIVE ANTERIOR CERVICAL DECOMPRESSION FUSION WITH INSTRUMENTATION AND ALLOGRAFT;  Surgeon: Estill Bamberg, MD;  Location: [facility];  Service: Orthopedics;  Laterality: N/A;   BREAST BIOPSY Left    u/s guided core biopsy   BUNIONECTOMY Bilateral    many years ago   CESAREAN SECTION  09/22/1980   COLONOSCOPY     ESOPHAGOGASTRODUODENOSCOPY     INCISION AND DRAINAGE ABSCESS  N/A 07/20/2012   Procedure: INCISION AND DRAINAGE ABSCESS;  Surgeon: Axel Filler, MD;  Location: [facility];  Service: General;  Laterality: N/A;   KNEE ARTHROSCOPY W/ MENISCAL REPAIR Left 2001   LAPAROSCOPY FOR ECTOPIC PREGNANCY  ~ 1987   left shoulder bone "scraping" Left 2017   REPLACEMENT TOTAL KNEE Left 2009   ruptured disc     TONSILLECTOMY     removed as a child   TUBAL LIGATION  ~ 1987   Patient Active Problem List   Diagnosis Date Noted   Palpitations 05/16/2018   Essential hypertension 05/16/2018   Radiculopathy 06/29/2017   MRSA (methicillin resistant Staphylococcus aureus) 07/23/2012   Abscess of back 07/19/2012    REFERRING DIAG: REFERRING DIAG: R26.89 (ICD-10-CM) - Other abnormalities of gait and mobility   THERAPY DIAG:  Unsteadiness on feet  Other abnormalities of gait and mobility  Muscle weakness (generalized)  Rationale for Evaluation and Treatment Rehabilitation  PERTINENT HISTORY: PERTINENT HISTORY: L vestibular schwannoma, GERD, HTN, migraines, chronic LBP, arthritis, depression, sleep apnea, DM, ACDF 03/2021   PRECAUTIONS: PRECAUTIONS: Fall and Other: No lifting >10#   SUBJECTIVE: Not feeling too bad, improving in walking distance with rollator  PAIN:  Are you having pain? No  OBJECTIVE:  TODAY'S TREATMENT: 08/20/21 Activity Comments  NU-step level 4 x 5 min for warm-up   Sit to stand 3x5 reps with green loop around knees  Standing hip abd 2x10 With green t-loop  Forward march in parallel bars 6 laps, cues for tight stomach  Standing on foam: wide BOS EO/EC x 30 sec; narrow BOS EO/EC x 30 sec-- 3 sets   Shoulder rolls 5x in standing   Tandem stance 3x15 sec left/right          Access Code: [email or web address] URL: [email or web address] Date: 08/13/2021 (Most recent update) Prepared by: [facility]  Exercises - Sit to Stand Without Arm Support  - 1 x daily - 7 x weekly - 3 sets - 5 reps - Tandem  Stance with Support  - 1 x daily - 7 x weekly - 2 sets - 30 sec hold - Standing Hip Abduction with Counter Support  - 1 x daily - 7 x weekly - 3 sets - 10 reps - 3 sec hold Added 08/13/2021 - Mini Squat with Counter Support  - 1 x daily - 5 x weekly - 3 sets - 10 reps - Standing Terminal Knee Extension with Resistance  - 1 x daily - 5 x weekly - 3 sets - 10 reps - 3 sec hold                 PATIENT EDUCATION: Education details: HEP updates-see above Person educated: Patient Education method: Explanation, Demonstration, and Handouts Education comprehension: verbalized understanding, returned demonstration, and needs further education     GOALS: Goals reviewed with patient? Yes   SHORT TERM GOALS: Target date: 08/10/2021    Pt will be independent with HEP to address strength, balance, gait for improved mobility and decreased fall risk. Baseline: Goal status: GOAL MET 08/10/2021   2.  Pt will improve 5x sit<>stand to less than or equal to 15 sec to demonstrate improved functional strength and transfer efficiency.  Baseline: 19.07 sec>11.88 sec 08/10/2021 Goal status: GOAL MET 08/10/2021   3.  Further vestibular testing as needed. Baseline:  Goal status: GOAL DEFERRED-no c/o in sessions of vertigo/dizziness symptoms   LONG TERM GOALS: Target date: 09/07/2021     Pt will be independent with progression of HEP to address strength, balance, gait for improved mobility and decreased fall risk. Baseline:  Goal status: IN PROGRESS   2.  Pt will improve 5x sit<>stand to less than or equal to 11.5 sec to demonstrate improved functional strength and transfer efficiency.   Baseline:  Goal status: IN PROGRESS   3.  Pt will improve TUG score to less than or equal to 20 sec for decreased fall risk.   Baseline: 27.31 sec Goal status: IN PROGRESS   4.  Berg Balance test to be assessed, with pt to improve by at least 5 points for decreased fall risk. Baseline: 42/56 Goal status: IN PROGRESS    5.  Pt will improve gait velocity to at least 2 ft/sec for improved gait efficiency and safety.   Baseline: 1.73 ft/sec Goal status: IN PROGRESS   ASSESSMENT:   CLINICAL IMPRESSION: Continued with session for HEP development with emphasis on gross strength, dynamic balance, and functional activity tolerance to increase independence and safety with community-level ambulation progressing to use of stronger green resistance t-loop for lower body strengthening.  Cues for core engagement during standing balance activities with improved stability during walking march appreciated with this instruction.  Continued sessions indicated  to progress LE strength and balance to reduce risk for falls and progress to ambulation without AD per pt goal  OBJECTIVE IMPAIRMENTS Abnormal gait, decreased activity tolerance, decreased balance, decreased knowledge of use of DME, decreased mobility, difficulty walking, decreased ROM, decreased strength, impaired flexibility, and postural dysfunction.    ACTIVITY LIMITATIONS carrying, bending, standing, squatting, stairs, transfers, and locomotion level   PARTICIPATION LIMITATIONS: meal prep, cleaning, laundry, shopping, community activity, and occupation   PERSONAL FACTORS 3+ comorbidities: See PMH above/problem list  are also affecting patient's functional outcome.    REHAB POTENTIAL: Good   CLINICAL DECISION MAKING: Evolving/moderate complexity   EVALUATION COMPLEXITY: Moderate     PLAN: PT FREQUENCY: 2x/week   PT DURATION: 8 weeks   PLANNED INTERVENTIONS: Therapeutic exercises, Therapeutic activity, Neuromuscular re-education, Balance training, Gait training, Patient/Family education, Joint mobilization, Stair training, Vestibular training, Canalith repositioning, DME instructions, and Manual therapy   PLAN FOR NEXT SESSION:  Continue Balance training, use of  foot-up brace with wedge RLE.  Add to HEP as needed to address core, hip strengthening; balance  training.      8:50 AM, 08/20/21 M. Sherlyn Lees, PT, DPT Physical Therapist- [place] Office Number: [phone]

## 2021-08-25 ENCOUNTER — Ambulatory Visit: Payer: Medicare Other | Attending: Otolaryngology | Admitting: Physical Therapy

## 2021-08-25 ENCOUNTER — Encounter: Payer: Self-pay | Admitting: Physical Therapy

## 2021-08-25 DIAGNOSIS — R2689 Other abnormalities of gait and mobility: Secondary | ICD-10-CM | POA: Diagnosis not present

## 2021-08-25 DIAGNOSIS — R2681 Unsteadiness on feet: Secondary | ICD-10-CM | POA: Insufficient documentation

## 2021-08-25 DIAGNOSIS — M6281 Muscle weakness (generalized): Secondary | ICD-10-CM | POA: Insufficient documentation

## 2021-08-25 NOTE — Therapy (Signed)
OUTPATIENT PHYSICAL THERAPY TREATMENT NOTE   Patient Name: Karla Baker MRN: [id number] DOB:1960-10-24, 61 y.o., female Today's Date: 08/25/2021  PCP: PCP: Iona Beard, MD  REFERRING PROVIDER: REFERRING PROVIDER: Lavonia Dana, MD      END OF SESSION:   PT End of Session - 08/25/21 0800     Visit Number 13    Number of Visits 17    Date for PT Re-Evaluation 09/07/21    Authorization Type Medicare    Progress Note Due on Visit 51    PT Start Time 0802    PT Stop Time 0845    PT Time Calculation (min) 43 min    Equipment Utilized During Treatment Gait belt    Activity Tolerance Patient tolerated treatment well    Behavior During Therapy WFL for tasks assessed/performed                   Past Medical History:  Diagnosis Date   Arthritis    "knees and hands" (07/19/2012)   Boil, back    "popped it 07/08/2012 & it got infected" (07/19/2012)   Chronic lower back pain    Depression    Essential hypertension 05/16/2018   GERD (gastroesophageal reflux disease)    High cholesterol    "don't think I take the RX anymore" (07/19/2012)   Hypertension    Hyperthyroidism    "overactive; never put me on anything for it" (07/19/2012)   Migraines    "only when I took BCP" (07/19/2012)   Palpitations 05/16/2018   Pneumonia    "a couple times" (07/19/2012)   Sleep apnea    does not wear CPAP   Type II diabetes mellitus ([place])    Past Surgical History:  Procedure Laterality Date   ANTERIOR CERVICAL DECOMP/DISCECTOMY FUSION N/A 03/25/2021   Procedure: CERVICALTHREE- CERVICAL FOUR, CERVICAL FOUR- CERVICAL FIVE ANTERIOR CERVICAL DECOMPRESSION FUSION WITH INSTRUMENTATION AND ALLOGRAFT;  Surgeon: Phylliss Bob, MD;  Location: [place];  Service: Orthopedics;  Laterality: N/A;   BREAST BIOPSY Left    u/s guided core biopsy   BUNIONECTOMY Bilateral    many years ago   CESAREAN SECTION  09/22/1980   COLONOSCOPY     ESOPHAGOGASTRODUODENOSCOPY     INCISION AND DRAINAGE  ABSCESS N/A 07/20/2012   Procedure: INCISION AND DRAINAGE ABSCESS;  Surgeon: Ralene Ok, MD;  Location: [place] [facility];  Service: General;  Laterality: N/A;   KNEE ARTHROSCOPY W/ MENISCAL REPAIR Left 2001   LAPAROSCOPY FOR ECTOPIC PREGNANCY  ~ 1987   left shoulder bone "scraping" Left 2017   REPLACEMENT TOTAL KNEE Left 2009   ruptured disc     TONSILLECTOMY     removed as a child   TUBAL LIGATION  ~ 1987   Patient Active Problem List   Diagnosis Date Noted   Palpitations 05/16/2018   Essential hypertension 05/16/2018   Radiculopathy 06/29/2017   MRSA (methicillin resistant Staphylococcus aureus) 07/23/2012   Abscess of back 07/19/2012    REFERRING DIAG: REFERRING DIAG: R26.89 (ICD-10-CM) - Other abnormalities of gait and mobility   THERAPY DIAG:  Unsteadiness on feet  Muscle weakness (generalized)  Other abnormalities of gait and mobility  Rationale for Evaluation and Treatment Rehabilitation  PERTINENT HISTORY: PERTINENT HISTORY: L vestibular schwannoma, GERD, HTN, migraines, chronic LBP, arthritis, depression, sleep apnea, DM, ACDF 03/2021   PRECAUTIONS: PRECAUTIONS: Fall and Other: No lifting >10#   SUBJECTIVE: I need you to hook up my brace on my foot.  Husband didn't do it this morning.  PAIN:  Are you having pain? Yes: NPRS scale: 6/10 Pain location: R hand Pain description: tingling, numbness Aggravating factors: unsure Relieving factors: unsure    OBJECTIVE:    TODAY'S TREATMENT: 08/25/2021 Activity Comments  NuStep, Level 4, 4 extremities x 8 minutes for aerobic warmup-strength and flexibility Keeps steps/min >60-70 throughout; 30 second bouts of >100  Standing hip abd 2x10 With green theraband, cues for abdominal activation  Shoulder rolls 10x in standing   Forward march in parallel bars 6 laps, cues for tight stomach; green resistance band  Resisted green theraband sidestepping R and L in parallel bars, 3 reps each direction Light UE support, cues for  abdominal activation  Gait 85 ft with no device, with cues for tight abdominals, relaxed arm swing; gait with no device 2 x 20 ft, 10 ft     [facility] Adult PT Treatment/Exercise - 08/25/21 0001       Standardized Balance Assessment   Standardized Balance Assessment Berg Balance Test;Dynamic Gait Index      Berg Balance Test   Sit to Stand Able to stand without using hands and stabilize independently    Standing Unsupported Able to stand safely 2 minutes    Sitting with Back Unsupported but Feet Supported on Floor or Stool Able to sit safely and securely 2 minutes    Stand to Sit Sits safely with minimal use of hands    Transfers Able to transfer safely, minor use of hands    Standing Unsupported with Eyes Closed Able to stand 10 seconds safely    Standing Ubsupported with Feet Together Able to place feet together independently and stand 1 minute safely    From Standing, Reach Forward with Outstretched Arm Can reach forward >12 cm safely (5")    From Standing Position, Pick up Object from Floor Able to pick up shoe safely and easily    From Standing Position, Turn to Look Behind Over each Shoulder Looks behind from both sides and weight shifts well    Turn 360 Degrees Able to turn 360 degrees safely but slowly    Standing Unsupported, Alternately Place Feet on Step/Stool Able to stand independently and safely and complete 8 steps in 20 seconds    Standing Unsupported, One Foot in Front Able to plae foot ahead of the other independently and hold 30 seconds    Standing on One Leg Tries to lift leg/unable to hold 3 seconds but remains standing independently    Total Score 49      Dynamic Gait Index   Level Surface Moderate Impairment   16.91   Change in Gait Speed Mild Impairment    Gait with Horizontal Head Turns Mild Impairment    Gait with Vertical Head Turns Mild Impairment    Gait and Pivot Turn Mild Impairment    Step Over Obstacle Severe Impairment    Step Around Obstacles Moderate  Impairment    Steps Mild Impairment    Total Score 12                      Access Code: [email or web address] URL: [email or web address] Date: 08/13/2021 (Most recent update) Prepared by: [facility]  Exercises - Sit to Stand Without Arm Support  - 1 x daily - 7 x weekly - 3 sets - 5 reps - Tandem Stance with Support  - 1 x daily - 7 x weekly - 2 sets - 30 sec hold - Standing Hip Abduction with Counter  Support  - 1 x daily - 7 x weekly - 3 sets - 10 reps - 3 sec hold - Mini Squat with Counter Support  - 1 x daily - 5 x weekly - 3 sets - 10 reps - Standing Terminal Knee Extension with Resistance  - 1 x daily - 5 x weekly - 3 sets - 10 reps - 3 sec hold                 PATIENT EDUCATION: Education details: HEP updates-see above Person educated: Patient Education method: Explanation, Demonstration, and Handouts Education comprehension: verbalized understanding, returned demonstration, and needs further education     GOALS: Goals reviewed with patient? Yes   SHORT TERM GOALS: Target date: 08/10/2021    Pt will be independent with HEP to address strength, balance, gait for improved mobility and decreased fall risk. Baseline: Goal status: GOAL MET 08/10/2021   2.  Pt will improve 5x sit<>stand to less than or equal to 15 sec to demonstrate improved functional strength and transfer efficiency.  Baseline: 19.07 sec>11.88 sec 08/10/2021 Goal status: GOAL MET 08/10/2021   3.  Further vestibular testing as needed. Baseline:  Goal status: GOAL DEFERRED-no c/o in sessions of vertigo/dizziness symptoms   LONG TERM GOALS: Target date: 09/07/2021     Pt will be independent with progression of HEP to address strength, balance, gait for improved mobility and decreased fall risk. Baseline:  Goal status: IN PROGRESS   2.  Pt will improve 5x sit<>stand to less than or equal to 11.5 sec to demonstrate improved functional strength and transfer  efficiency.   Baseline:  Goal status: IN PROGRESS   3.  Pt will improve TUG score to less than or equal to 20 sec for decreased fall risk.   Baseline: 27.31 sec Goal status: IN PROGRESS   4.  Berg Balance test to be assessed, with pt to improve by at least 5 points for decreased fall risk. Baseline: 42/56>49/56 Goal status: GOAL MET, 08/25/2021   5.  Pt will improve gait velocity to at least 2 ft/sec for improved gait efficiency and safety.   Baseline: 1.73 ft/sec Goal status: IN PROGRESS   ASSESSMENT:   CLINICAL IMPRESSION: Skilled PT session today focused on hip and lower extremity strength, attention to abdominal activation and arm swing with gait when not using device.  Discussed short distance gait at home with no device with focus on tight stomach, relaxed arms, and trying not to reach for furniture.  Assessed Berg Balance score today, with pt meeting LTG 4 for improved Berg to 49/56.  Assessed DGI, and pt is at increased fall risk with increased instability with score of 12/24.  Pt will continue to benefit from skilled PT towards remaining LTGs and further focus on gait without device for improved safety and stability with functional mobility.  OBJECTIVE IMPAIRMENTS Abnormal gait, decreased activity tolerance, decreased balance, decreased knowledge of use of DME, decreased mobility, difficulty walking, decreased ROM, decreased strength, impaired flexibility, and postural dysfunction.    ACTIVITY LIMITATIONS carrying, bending, standing, squatting, stairs, transfers, and locomotion level   PARTICIPATION LIMITATIONS: meal prep, cleaning, laundry, shopping, community activity, and occupation   PERSONAL FACTORS 3+ comorbidities: See PMH above/problem list  are also affecting patient's functional outcome.    REHAB POTENTIAL: Good   CLINICAL DECISION MAKING: Evolving/moderate complexity   EVALUATION COMPLEXITY: Moderate     PLAN: PT FREQUENCY: 2x/week   PT DURATION: 8 weeks    PLANNED INTERVENTIONS: Therapeutic exercises, Therapeutic  activity, Neuromuscular re-education, Balance training, Gait training, Patient/Family education, Joint mobilization, Stair training, Vestibular training, Canalith repositioning, DME instructions, and Manual therapy   PLAN FOR NEXT SESSION:  Ask how green theraband went at home for HEP; ask how gait short distances without device at home went.  Continue Balance training, use of  foot-up brace with wedge RLE.  Add to HEP as needed to address core, hip strengthening; balance training.   *LTGs need to be assessed 2/80 and recert done if pt continues beyond that date.   Mady Haagensen, PT 08/25/21 8:57 AM Phone: [phone] Fax: [phone]  [facility] at [facility] [place], [place], [place] [address] Phone # [phone] Fax # [phone]

## 2021-08-27 ENCOUNTER — Ambulatory Visit: Payer: Medicare Other

## 2021-08-27 DIAGNOSIS — R2689 Other abnormalities of gait and mobility: Secondary | ICD-10-CM

## 2021-08-27 DIAGNOSIS — R2681 Unsteadiness on feet: Secondary | ICD-10-CM | POA: Diagnosis not present

## 2021-08-27 DIAGNOSIS — M6281 Muscle weakness (generalized): Secondary | ICD-10-CM

## 2021-08-27 NOTE — Therapy (Signed)
OUTPATIENT PHYSICAL THERAPY TREATMENT NOTE   Patient Name: Karla Baker MRN: [id number] DOB:07-29-1960, 61 y.o., female Today's Date: 08/27/2021  PCP: PCP: Iona Beard, MD  REFERRING PROVIDER: REFERRING PROVIDER: Lavonia Dana, MD      END OF SESSION:   PT End of Session - 08/27/21 0754     Visit Number 14    Number of Visits 17    Date for PT Re-Evaluation 09/07/21    Authorization Type Medicare    Progress Note Due on Visit 9    PT Start Time 0752    PT Stop Time 0837    PT Time Calculation (min) 45 min    Equipment Utilized During Treatment Gait belt    Activity Tolerance Patient tolerated treatment well    Behavior During Therapy WFL for tasks assessed/performed                   Past Medical History:  Diagnosis Date   Arthritis    "knees and hands" (07/19/2012)   Boil, back    "popped it 07/08/2012 & it got infected" (07/19/2012)   Chronic lower back pain    Depression    Essential hypertension 05/16/2018   GERD (gastroesophageal reflux disease)    High cholesterol    "don't think I take the RX anymore" (07/19/2012)   Hypertension    Hyperthyroidism    "overactive; never put me on anything for it" (07/19/2012)   Migraines    "only when I took BCP" (07/19/2012)   Palpitations 05/16/2018   Pneumonia    "a couple times" (07/19/2012)   Sleep apnea    does not wear CPAP   Type II diabetes mellitus ([place])    Past Surgical History:  Procedure Laterality Date   ANTERIOR CERVICAL DECOMP/DISCECTOMY FUSION N/A 03/25/2021   Procedure: CERVICALTHREE- CERVICAL FOUR, CERVICAL FOUR- CERVICAL FIVE ANTERIOR CERVICAL DECOMPRESSION FUSION WITH INSTRUMENTATION AND ALLOGRAFT;  Surgeon: Phylliss Bob, MD;  Location: [facility];  Service: Orthopedics;  Laterality: N/A;   BREAST BIOPSY Left    u/s guided core biopsy   BUNIONECTOMY Bilateral    many years ago   CESAREAN SECTION  09/22/1980   COLONOSCOPY     ESOPHAGOGASTRODUODENOSCOPY     INCISION AND DRAINAGE  ABSCESS N/A 07/20/2012   Procedure: INCISION AND DRAINAGE ABSCESS;  Surgeon: Ralene Ok, MD;  Location: [facility];  Service: General;  Laterality: N/A;   KNEE ARTHROSCOPY W/ MENISCAL REPAIR Left 2001   LAPAROSCOPY FOR ECTOPIC PREGNANCY  ~ 1987   left shoulder bone "scraping" Left 2017   REPLACEMENT TOTAL KNEE Left 2009   ruptured disc     TONSILLECTOMY     removed as a child   TUBAL LIGATION  ~ 1987   Patient Active Problem List   Diagnosis Date Noted   Palpitations 05/16/2018   Essential hypertension 05/16/2018   Radiculopathy 06/29/2017   MRSA (methicillin resistant Staphylococcus aureus) 07/23/2012   Abscess of back 07/19/2012    REFERRING DIAG: REFERRING DIAG: R26.89 (ICD-10-CM) - Other abnormalities of gait and mobility   THERAPY DIAG:  Unsteadiness on feet  Muscle weakness (generalized)  Other abnormalities of gait and mobility  Rationale for Evaluation and Treatment Rehabilitation  PERTINENT HISTORY: PERTINENT HISTORY: L vestibular schwannoma, GERD, HTN, migraines, chronic LBP, arthritis, depression, sleep apnea, DM, ACDF 03/2021   PRECAUTIONS: PRECAUTIONS: Fall and Other: No lifting >10#   SUBJECTIVE: Fell in the kitchen yesterday when I squatted down to get things off the floor, it was a slow fall  and I'm not hurt  PAIN:  Are you having pain? Yes: NPRS scale: 4-5/10 Pain location: R hand Pain description: tingling, numbness Aggravating factors: unsure Relieving factors: unsure    OBJECTIVE:   TODAY'S TREATMENT: 08/27/21 Activity Comments  NU-step level 4 x 6 min for dynamic warm-up   Shoulder rolls 10x in standing   Standing hip abd 3x10 Green t-loop cues for abd contraction  Sidestepping x 2 min Green t-loop, cues for leg position, core contraction  Tandem stance March in // bars x 2 min Unilat support, cues for core. RLE fatigue and subsequent instability  Standing on airex pad   >Squat reach to cones on 8" step reaching opposite hand (10 cones)   >Alt  stair taps x 60 sec unilat UE   >Foot on step x 30 sec, eyes closed x 15 sec   >EO/EC feet together x 15-30 sec   >arm swing EO/EC 2x 15 sec   > head turns EO 5 reps   > head turns EC 5 reps                  Access Code: [email or web address] URL: [email or web address] Date: 08/13/2021 (Most recent update) Prepared by: [facility]  Exercises - Sit to Stand Without Arm Support  - 1 x daily - 7 x weekly - 3 sets - 5 reps - Tandem Stance with Support  - 1 x daily - 7 x weekly - 2 sets - 30 sec hold - Standing Hip Abduction with Counter Support  - 1 x daily - 7 x weekly - 3 sets - 10 reps - 3 sec hold - Mini Squat with Counter Support  - 1 x daily - 5 x weekly - 3 sets - 10 reps - Standing Terminal Knee Extension with Resistance  - 1 x daily - 5 x weekly - 3 sets - 10 reps - 3 sec hold                 PATIENT EDUCATION: Education details: HEP updates-see above Person educated: Patient Education method: Explanation, Demonstration, and Handouts Education comprehension: verbalized understanding, returned demonstration, and needs further education     GOALS: Goals reviewed with patient? Yes   SHORT TERM GOALS: Target date: 08/10/2021    Pt will be independent with HEP to address strength, balance, gait for improved mobility and decreased fall risk. Baseline: Goal status: GOAL MET 08/10/2021   2.  Pt will improve 5x sit<>stand to less than or equal to 15 sec to demonstrate improved functional strength and transfer efficiency.  Baseline: 19.07 sec>11.88 sec 08/10/2021 Goal status: GOAL MET 08/10/2021   3.  Further vestibular testing as needed. Baseline:  Goal status: GOAL DEFERRED-no c/o in sessions of vertigo/dizziness symptoms   LONG TERM GOALS: Target date: 09/07/2021     Pt will be independent with progression of HEP to address strength, balance, gait for improved mobility and decreased fall risk. Baseline:  Goal status: IN  PROGRESS   2.  Pt will improve 5x sit<>stand to less than or equal to 11.5 sec to demonstrate improved functional strength and transfer efficiency.   Baseline:  Goal status: IN PROGRESS   3.  Pt will improve TUG score to less than or equal to 20 sec for decreased fall risk.   Baseline: 27.31 sec Goal status: IN PROGRESS   4.  Berg Balance test to be assessed, with pt to improve by at least 5 points for decreased  fall risk. Baseline: 42/56>49/56 Goal status: GOAL MET, 08/25/2021   5.  Pt will improve gait velocity to at least 2 ft/sec for improved gait efficiency and safety.   Baseline: 1.73 ft/sec Goal status: IN PROGRESS   ASSESSMENT:   CLINICAL IMPRESSION: Session with focus on improving strength, activity tolerance, and stability on firm/compliant surfaces to reduce unsteadiness and enhance/facilitate core contraction for postural stability.  Demo difficulty with RLE stability on compliant surface, especially with eyes closed as she tends to hyperextend right knee into extension for mechanical stability.  Improved actiivty tolerance evident by need for only three 1.5 min seated rest periods during session today. Continued to encourage attendence to [facility] for walking on indoor track using rollator. Continued sessions to progress LE strength, motor control, and dyanmic balance to reduce risk for falls  OBJECTIVE IMPAIRMENTS Abnormal gait, decreased activity tolerance, decreased balance, decreased knowledge of use of DME, decreased mobility, difficulty walking, decreased ROM, decreased strength, impaired flexibility, and postural dysfunction.    ACTIVITY LIMITATIONS carrying, bending, standing, squatting, stairs, transfers, and locomotion level   PARTICIPATION LIMITATIONS: meal prep, cleaning, laundry, shopping, community activity, and occupation   PERSONAL FACTORS 3+ comorbidities: See PMH above/problem list  are also affecting patient's functional outcome.    REHAB POTENTIAL: Good    CLINICAL DECISION MAKING: Evolving/moderate complexity   EVALUATION COMPLEXITY: Moderate     PLAN: PT FREQUENCY: 2x/week   PT DURATION: 8 weeks   PLANNED INTERVENTIONS: Therapeutic exercises, Therapeutic activity, Neuromuscular re-education, Balance training, Gait training, Patient/Family education, Joint mobilization, Stair training, Vestibular training, Canalith repositioning, DME instructions, and Manual therapy   PLAN FOR NEXT SESSION:  Ask how green theraband went at home for HEP; ask how gait short distances without device at home went.  Continue Balance training, use of  foot-up brace with wedge RLE.  Add to HEP as needed to address core, hip strengthening; balance training.   *LTGs need to be assessed 7/74 and recert done if pt continues beyond that date.   8:41 AM, 08/27/21 M. Sherlyn Lees, PT, DPT Physical Therapist- [place] Office Number: [phone]   [place] at [facility] [address] [place], [place] [address] Phone # [phone] Fax # [phone]

## 2021-09-07 ENCOUNTER — Ambulatory Visit: Payer: Medicare Other

## 2021-09-08 ENCOUNTER — Ambulatory Visit: Payer: Medicare Other

## 2021-09-08 DIAGNOSIS — R2681 Unsteadiness on feet: Secondary | ICD-10-CM

## 2021-09-08 DIAGNOSIS — M6281 Muscle weakness (generalized): Secondary | ICD-10-CM

## 2021-09-08 DIAGNOSIS — R2689 Other abnormalities of gait and mobility: Secondary | ICD-10-CM

## 2021-09-08 NOTE — Therapy (Signed)
OUTPATIENT PHYSICAL THERAPY TREATMENT NOTE and D/C Summary   Patient Name: Karla Baker MRN: [id number] DOB:[birth year], 61 y.o., female Today's Date: 09/08/2021  PCP: PCP: Mirna Mires, MD  REFERRING PROVIDER: REFERRING PROVIDER: Nigel Sloop, MD   PHYSICAL THERAPY DISCHARGE SUMMARY  Visits from Start of Care: 15  Current functional level related to goals / functional outcomes: Pt has met or partially-met STG/LTG   Remaining deficits: Continues to experience balance deficits and notes limitations in activity tolerance   Education / Equipment: HEP, use of rollator for gait   Patient agrees to discharge. Patient goals were partially met. Patient is being discharged due to maximized rehab potential.     END OF SESSION:   PT End of Session - 09/08/21 1020     Visit Number 15    Number of Visits 17    Date for PT Re-Evaluation 09/07/21    Authorization Type Medicare    Progress Note Due on Visit 19    PT Start Time 1015    PT Stop Time 1100    PT Time Calculation (min) 45 min    Equipment Utilized During Treatment Gait belt    Activity Tolerance Patient tolerated treatment well    Behavior During Therapy WFL for tasks assessed/performed                   Past Medical History:  Diagnosis Date   Arthritis    "knees and hands" (07/19/2012)   Boil, back    "popped it 07/08/2012 & it got infected" (07/19/2012)   Chronic lower back pain    Depression    Essential hypertension 05/16/2018   GERD (gastroesophageal reflux disease)    High cholesterol    "don't think I take the RX anymore" (07/19/2012)   Hypertension    Hyperthyroidism    "overactive; never put me on anything for it" (07/19/2012)   Migraines    "only when I took BCP" (07/19/2012)   Palpitations 05/16/2018   Pneumonia    "a couple times" (07/19/2012)   Sleep apnea    does not wear CPAP   Type II diabetes mellitus ([facility])    Past Surgical History:  Procedure Laterality Date   ANTERIOR  CERVICAL DECOMP/DISCECTOMY FUSION N/A 03/25/2021   Procedure: CERVICALTHREE- CERVICAL FOUR, CERVICAL FOUR- CERVICAL FIVE ANTERIOR CERVICAL DECOMPRESSION FUSION WITH INSTRUMENTATION AND ALLOGRAFT;  Surgeon: Estill Bamberg, MD;  Location: [facility];  Service: Orthopedics;  Laterality: N/A;   BREAST BIOPSY Left    u/s guided core biopsy   BUNIONECTOMY Bilateral    many years ago   CESAREAN SECTION  09/22/1980   COLONOSCOPY     ESOPHAGOGASTRODUODENOSCOPY     INCISION AND DRAINAGE ABSCESS N/A 07/20/2012   Procedure: INCISION AND DRAINAGE ABSCESS;  Surgeon: Axel Filler, MD;  Location: [facility];  Service: General;  Laterality: N/A;   KNEE ARTHROSCOPY W/ MENISCAL REPAIR Left 2001   LAPAROSCOPY FOR ECTOPIC PREGNANCY  ~ 1987   left shoulder bone "scraping" Left 2017   REPLACEMENT TOTAL KNEE Left 2009   ruptured disc     TONSILLECTOMY     removed as a child   TUBAL LIGATION  ~ 1987   Patient Active Problem List   Diagnosis Date Noted   Palpitations 05/16/2018   Essential hypertension 05/16/2018   Radiculopathy 06/29/2017   MRSA (methicillin resistant Staphylococcus aureus) 07/23/2012   Abscess of back 07/19/2012    REFERRING DIAG: REFERRING DIAG: R26.89 (ICD-10-CM) - Other abnormalities of gait and mobility  THERAPY DIAG:  No diagnosis found.  Rationale for Evaluation and Treatment Rehabilitation  PERTINENT HISTORY: PERTINENT HISTORY: L vestibular schwannoma, GERD, HTN, migraines, chronic LBP, arthritis, depression, sleep apnea, DM, ACDF 03/2021   PRECAUTIONS: PRECAUTIONS: Fall and Other: No lifting >10#   SUBJECTIVE: "Feeling about 35% improved since starting therapy"  PAIN:  Are you having pain? Yes: NPRS scale: 4-5/10 Pain location: R hand Pain description: tingling, numbness Aggravating factors: unsure Relieving factors: unsure    OBJECTIVE:   TODAY'S TREATMENT: 09/08/21 Activity Comments  Discussion with pt regarding POC details Interview regarding deficits/limitations and  perceived barriers.   5xSTS 13.41 sec w/out UE support  TUG test 15.16 sec w/ rollator  10 meter Walk Test 11.71 sec = 2.8 ft/sec  HEP review Fair recall  Pt education in sleeping positions/postures as it relates to cervical spine. Various pillows and positions demonstrated in order to improve anatomic alignment to reduce pain with sideyling     TODAY'S TREATMENT: 08/27/21 Activity Comments  NU-step level 4 x 6 min for dynamic warm-up   Shoulder rolls 10x in standing   Standing hip abd 3x10 Green t-loop cues for abd contraction  Sidestepping x 2 min Green t-loop, cues for leg position, core contraction  Tandem stance March in // bars x 2 min Unilat support, cues for core. RLE fatigue and subsequent instability  Standing on airex pad   >Squat reach to cones on 8" step reaching opposite hand (10 cones)   >Alt stair taps x 60 sec unilat UE   >Foot on step x 30 sec, eyes closed x 15 sec   >EO/EC feet together x 15-30 sec   >arm swing EO/EC 2x 15 sec   > head turns EO 5 reps   > head turns EC 5 reps                  Access Code: [email or web address] URL: [email or web address] Date: 08/13/2021 (Most recent update) Prepared by: [facility]  Exercises - Sit to Stand Without Arm Support  - 1 x daily - 7 x weekly - 3 sets - 5 reps - Tandem Stance with Support  - 1 x daily - 7 x weekly - 2 sets - 30 sec hold - Standing Hip Abduction with Counter Support  - 1 x daily - 7 x weekly - 3 sets - 10 reps - 3 sec hold - Mini Squat with Counter Support  - 1 x daily - 5 x weekly - 3 sets - 10 reps - Standing Terminal Knee Extension with Resistance  - 1 x daily - 5 x weekly - 3 sets - 10 reps - 3 sec hold                 PATIENT EDUCATION: Education details: HEP updates-see above Person educated: Patient Education method: Explanation, Demonstration, and Handouts Education comprehension: verbalized understanding, returned demonstration, and needs  further education     GOALS: Goals reviewed with patient? Yes   SHORT TERM GOALS: Target date: 08/10/2021    Pt will be independent with HEP to address strength, balance, gait for improved mobility and decreased fall risk. Baseline: Goal status: GOAL MET 08/10/2021   2.  Pt will improve 5x sit<>stand to less than or equal to 15 sec to demonstrate improved functional strength and transfer efficiency.  Baseline: 19.07 sec>11.88 sec 08/10/2021 Goal status: GOAL MET 08/10/2021   3.  Further vestibular testing as needed. Baseline:  Goal status: GOAL  DEFERRED-no c/o in sessions of vertigo/dizziness symptoms   LONG TERM GOALS: Target date: 09/07/2021     Pt will be independent with progression of HEP to address strength, balance, gait for improved mobility and decreased fall risk. Baseline:  Goal status:Partially met   2.  Pt will improve 5x sit<>stand to less than or equal to 11.5 sec to demonstrate improved functional strength and transfer efficiency.   Baseline: 13.41 sec (09/08/21) Goal status: Not met   3.  Pt will improve TUG score to less than or equal to 20 sec for decreased fall risk.   Baseline: 15.16 sec w/ rollator (09/08/21) Goal status: MET   4.  Berg Balance test to be assessed, with pt to improve by at least 5 points for decreased fall risk. Baseline: 42/56>49/56 Goal status: GOAL MET, 08/25/2021   5.  Pt will improve gait velocity to at least 2 ft/sec for improved gait efficiency and safety.   Baseline: 2.8 ft/sec w/ rollator Goal status: MET   ASSESSMENT:   CLINICAL IMPRESSION: Patient has overall demonstrated improved functional status since start of care with greater improvements in BLE strength, balance, activity tolerance, mobility, and manifest reduced risk for falls per [facility] Test, 5xSTS test, and TUG test.  Able to ambulate modified independently using rollator and negotiate level surfaces as such.  Recommend supervision for uneven surface and curb  negotiation due to hx of falling. Seated LE resisted tests reveal 4 to 4+/5 gross BLE strength  HEP review reveals less than optimal performance requiring continued cues in sequence and rehearsal with pt admitting sporadic performance at home and mainly performing sit-stand and tandem stance balance exercise.  Therapist reinforced importance of regular HEP performance and physical activity in general to aid in improved mobility and activity tolerance.  Encouraged to continue HEP performance and attendance to local [employer] where she has a membership to engage in progressive walking program using her rollator and indoor track for level, safe surface.    OBJECTIVE IMPAIRMENTS Abnormal gait, decreased activity tolerance, decreased balance, decreased knowledge of use of DME, decreased mobility, difficulty walking, decreased ROM, decreased strength, impaired flexibility, and postural dysfunction.    ACTIVITY LIMITATIONS carrying, bending, standing, squatting, stairs, transfers, and locomotion level   PARTICIPATION LIMITATIONS: meal prep, cleaning, laundry, shopping, community activity, and occupation   PERSONAL FACTORS 3+ comorbidities: See PMH above/problem list  are also affecting patient's functional outcome.    REHAB POTENTIAL: Good   CLINICAL DECISION MAKING: Evolving/moderate complexity   EVALUATION COMPLEXITY: Moderate     PLAN: PT FREQUENCY: 2x/week   PT DURATION: 8 weeks   PLANNED INTERVENTIONS: Therapeutic exercises, Therapeutic activity, Neuromuscular re-education, Balance training, Gait training, Patient/Family education, Joint mobilization, Stair training, Vestibular training, Canalith repositioning, DME instructions, and Manual therapy   PLAN FOR NEXT SESSION:  D/C to HEP   10:21 AM, 09/08/21 M. Sherlyn Lees, PT, DPT Physical Therapist- [place] Office Number: [phone]   [place] at [facility] [address], [place], [place]  [address] Phone # [phone] Fax # [phone]

## 2021-09-10 ENCOUNTER — Ambulatory Visit: Payer: Medicare Other

## 2021-09-14 ENCOUNTER — Ambulatory Visit: Payer: Medicare Other

## 2021-09-17 ENCOUNTER — Ambulatory Visit: Payer: Medicare Other | Admitting: Physical Therapy

## 2021-10-26 DIAGNOSIS — J439 Emphysema, unspecified: Secondary | ICD-10-CM | POA: Diagnosis not present

## 2021-10-26 DIAGNOSIS — E1169 Type 2 diabetes mellitus with other specified complication: Secondary | ICD-10-CM | POA: Diagnosis not present

## 2021-10-26 DIAGNOSIS — E1165 Type 2 diabetes mellitus with hyperglycemia: Secondary | ICD-10-CM | POA: Diagnosis not present

## 2021-10-26 DIAGNOSIS — M545 Low back pain, unspecified: Secondary | ICD-10-CM | POA: Diagnosis not present

## 2021-10-26 DIAGNOSIS — E785 Hyperlipidemia, unspecified: Secondary | ICD-10-CM | POA: Diagnosis not present

## 2021-10-26 DIAGNOSIS — M21371 Foot drop, right foot: Secondary | ICD-10-CM | POA: Diagnosis not present

## 2021-10-26 DIAGNOSIS — E781 Pure hyperglyceridemia: Secondary | ICD-10-CM | POA: Diagnosis not present

## 2021-10-26 DIAGNOSIS — G4733 Obstructive sleep apnea (adult) (pediatric): Secondary | ICD-10-CM | POA: Diagnosis not present

## 2021-10-26 DIAGNOSIS — R202 Paresthesia of skin: Secondary | ICD-10-CM | POA: Diagnosis not present

## 2021-10-26 DIAGNOSIS — N3281 Overactive bladder: Secondary | ICD-10-CM | POA: Diagnosis not present

## 2021-10-26 DIAGNOSIS — I10 Essential (primary) hypertension: Secondary | ICD-10-CM | POA: Diagnosis not present

## 2021-10-26 DIAGNOSIS — I2510 Atherosclerotic heart disease of native coronary artery without angina pectoris: Secondary | ICD-10-CM | POA: Diagnosis not present

## 2021-11-05 DIAGNOSIS — Z9989 Dependence on other enabling machines and devices: Secondary | ICD-10-CM | POA: Diagnosis not present

## 2021-11-05 DIAGNOSIS — Z0000 Encounter for general adult medical examination without abnormal findings: Secondary | ICD-10-CM | POA: Diagnosis not present

## 2021-12-03 DIAGNOSIS — M25561 Pain in right knee: Secondary | ICD-10-CM | POA: Diagnosis not present

## 2022-02-04 ENCOUNTER — Other Ambulatory Visit: Payer: Self-pay | Admitting: Orthopedic Surgery

## 2022-02-04 DIAGNOSIS — M549 Dorsalgia, unspecified: Secondary | ICD-10-CM

## 2022-02-28 DIAGNOSIS — G4733 Obstructive sleep apnea (adult) (pediatric): Secondary | ICD-10-CM | POA: Diagnosis not present

## 2022-02-28 DIAGNOSIS — N3281 Overactive bladder: Secondary | ICD-10-CM | POA: Diagnosis not present

## 2022-02-28 DIAGNOSIS — M21371 Foot drop, right foot: Secondary | ICD-10-CM | POA: Diagnosis not present

## 2022-02-28 DIAGNOSIS — M545 Low back pain, unspecified: Secondary | ICD-10-CM | POA: Diagnosis not present

## 2022-02-28 DIAGNOSIS — E1169 Type 2 diabetes mellitus with other specified complication: Secondary | ICD-10-CM | POA: Diagnosis not present

## 2022-02-28 DIAGNOSIS — E785 Hyperlipidemia, unspecified: Secondary | ICD-10-CM | POA: Diagnosis not present

## 2022-02-28 DIAGNOSIS — R202 Paresthesia of skin: Secondary | ICD-10-CM | POA: Diagnosis not present

## 2022-02-28 DIAGNOSIS — I2510 Atherosclerotic heart disease of native coronary artery without angina pectoris: Secondary | ICD-10-CM | POA: Diagnosis not present

## 2022-03-02 ENCOUNTER — Other Ambulatory Visit: Payer: Self-pay | Admitting: Orthopedic Surgery

## 2022-03-02 DIAGNOSIS — M542 Cervicalgia: Secondary | ICD-10-CM

## 2022-03-02 DIAGNOSIS — M549 Dorsalgia, unspecified: Secondary | ICD-10-CM

## 2022-03-15 ENCOUNTER — Other Ambulatory Visit: Payer: Medicare Other

## 2022-03-23 ENCOUNTER — Ambulatory Visit
Admission: RE | Admit: 2022-03-23 | Discharge: 2022-03-23 | Disposition: A | Discharge status: Home / Self Care | Payer: Self-pay | Source: Ambulatory Visit | Attending: Orthopedic Surgery | Admitting: Orthopedic Surgery

## 2022-03-23 DIAGNOSIS — R0981 Nasal congestion: Secondary | ICD-10-CM | POA: Diagnosis not present

## 2022-03-23 DIAGNOSIS — Z6824 Body mass index (BMI) 24.0-24.9, adult: Secondary | ICD-10-CM | POA: Diagnosis not present

## 2022-03-23 DIAGNOSIS — M542 Cervicalgia: Secondary | ICD-10-CM

## 2022-03-23 DIAGNOSIS — J302 Other seasonal allergic rhinitis: Secondary | ICD-10-CM | POA: Diagnosis not present

## 2022-03-23 DIAGNOSIS — M50321 Other cervical disc degeneration at C4-C5 level: Secondary | ICD-10-CM | POA: Diagnosis not present

## 2022-03-23 DIAGNOSIS — R093 Abnormal sputum: Secondary | ICD-10-CM | POA: Diagnosis not present

## 2022-03-24 ENCOUNTER — Ambulatory Visit
Admission: RE | Admit: 2022-03-24 | Discharge: 2022-03-24 | Disposition: A | Discharge status: Home / Self Care | Source: Ambulatory Visit | Attending: Orthopedic Surgery | Admitting: Orthopedic Surgery

## 2022-03-24 DIAGNOSIS — M542 Cervicalgia: Secondary | ICD-10-CM

## 2022-03-30 ENCOUNTER — Ambulatory Visit
Admission: RE | Admit: 2022-03-30 | Discharge: 2022-03-30 | Disposition: A | Discharge status: Home / Self Care | Source: Ambulatory Visit | Attending: Orthopedic Surgery | Admitting: Orthopedic Surgery

## 2022-04-06 ENCOUNTER — Other Ambulatory Visit (HOSPITAL_COMMUNITY): Payer: Self-pay | Admitting: Orthopedic Surgery

## 2022-04-06 DIAGNOSIS — M542 Cervicalgia: Secondary | ICD-10-CM

## 2022-04-18 DIAGNOSIS — R0981 Nasal congestion: Secondary | ICD-10-CM | POA: Diagnosis not present

## 2022-04-18 DIAGNOSIS — J302 Other seasonal allergic rhinitis: Secondary | ICD-10-CM | POA: Diagnosis not present

## 2022-04-18 DIAGNOSIS — E1169 Type 2 diabetes mellitus with other specified complication: Secondary | ICD-10-CM | POA: Diagnosis not present

## 2022-04-18 DIAGNOSIS — R093 Abnormal sputum: Secondary | ICD-10-CM | POA: Diagnosis not present

## 2022-05-16 ENCOUNTER — Encounter (HOSPITAL_COMMUNITY): Payer: Self-pay

## 2022-05-16 ENCOUNTER — Other Ambulatory Visit: Payer: Self-pay

## 2022-05-16 NOTE — Progress Notes (Signed)
[facility] [place]  EKG - DOS  CPAP: Does not wear  Fasting Blood Sugar - 87-lows 100's Checks Blood Sugar 1/day  Anesthesia review: N  Patient verbally denies any shortness of breath, fever, cough and chest pain during phone call   -------------  SDW INSTRUCTIONS given:  Your procedure is scheduled on 05/17/22.  Report to [facility] Main Entrance "A" at 0730 A.M., and check in at the Admitting office.  Call this number if you have problems the morning of surgery:  [phone]   Remember:  Do not eat after midnight the night before your surgery  You may drink clear liquids until 0700 the morning of your surgery.   Clear liquids allowed are: Water, Non-Citrus Juices (without pulp), Carbonated Beverages, Clear Tea, Black Coffee Only, and Gatorade    Take these medicines the morning of surgery with A SIP OF WATER  buPROPion (WELLBUTRIN XL)  cetirizine (ZYRTEC)  omeprazole (PRILOSEC)  pregabalin (LYRICA)  sertraline (ZOLOFT)  SYNTHROID  HYDROcodone-acetaminophen (NORCO)-if needed Meclizine-if needed methocarbamol (ROBAXIN)-if needed  traMADol (ULTRAM)-if needed  .** PLEASE check your blood sugar the morning of your surgery when you wake up and every 2 hours until you get to the Short Stay unit.  If your blood sugar is less than 70 mg/dL, you will need to treat for low blood sugar: Do not take insulin. Treat a low blood sugar (less than 70 mg/dL) with  cup of clear juice (cranberry or apple), 4 glucose tablets, OR glucose gel. Recheck blood sugar in 15 minutes after treatment (to make sure it is greater than 70 mg/dL). If your blood sugar is not greater than 70 mg/dL on recheck, call [phone] for further instructions.   As of today, STOP taking any Aspirin (unless otherwise instructed by your surgeon) Aleve, Naproxen, Ibuprofen, Motrin, Advil, Goody's, BC's, all herbal medications, fish oil, and all vitamins.                      Do not wear jewelry, make up,  or nail polish            Do not wear lotions, powders, perfumes/colognes, or deodorant.            Do not shave 48 hours prior to surgery.  Men may shave face and neck.            Do not bring valuables to the hospital.            [facility] is not responsible for any belongings or valuables.  Do NOT Smoke (Tobacco/Vaping) 24 hours prior to your procedure If you use a CPAP at night, you may bring all equipment for your overnight stay.   Contacts, glasses, dentures or bridgework may not be worn into surgery.      For patients admitted to the hospital, discharge time will be determined by your treatment team.   Patients discharged the day of surgery will not be allowed to drive home, and someone needs to stay with them for 24 hours.    Special instructions:   [place]- Preparing For Surgery  Before surgery, you can play an important role. Because skin is not sterile, your skin needs to be as free of germs as possible. You can reduce the number of germs on your skin by washing with CHG (chlorahexidine gluconate) Soap before surgery.  CHG is an antiseptic cleaner which kills germs and bonds with the skin to continue killing germs even after washing.  Oral Hygiene is also important to reduce your risk of infection.  Remember - BRUSH YOUR TEETH THE MORNING OF SURGERY WITH YOUR REGULAR TOOTHPASTE  Please do not use if you have an allergy to CHG or antibacterial soaps. If your skin becomes reddened/irritated stop using the CHG.  Do not shave (including legs and underarms) for at least 48 hours prior to first CHG shower. It is OK to shave your face.  Please follow these instructions carefully.   Shower the NIGHT BEFORE SURGERY and the MORNING OF SURGERY with DIAL Soap.   Pat yourself dry with a CLEAN TOWEL.  Wear CLEAN PAJAMAS to bed the night before surgery  Place CLEAN SHEETS on your bed the night of your first shower and DO NOT SLEEP WITH PETS.   Day of Surgery: Please shower  morning of surgery  Wear Clean/Comfortable clothing the morning of surgery Do not apply any deodorants/lotions.   Remember to brush your teeth WITH YOUR REGULAR TOOTHPASTE.   Questions were answered. Patient verbalized understanding of instructions.

## 2022-05-17 ENCOUNTER — Other Ambulatory Visit: Payer: Self-pay

## 2022-05-17 ENCOUNTER — Ambulatory Visit (HOSPITAL_COMMUNITY): Payer: Medicare Other | Admitting: Certified Registered"

## 2022-05-17 ENCOUNTER — Ambulatory Visit (HOSPITAL_COMMUNITY)
Admission: RE | Admit: 2022-05-17 | Discharge: 2022-05-17 | Disposition: A | Discharge status: Home / Self Care | Payer: Medicare Other | Attending: Orthopedic Surgery | Admitting: Orthopedic Surgery

## 2022-05-17 ENCOUNTER — Ambulatory Visit (HOSPITAL_BASED_OUTPATIENT_CLINIC_OR_DEPARTMENT_OTHER): Payer: Medicare Other | Admitting: Certified Registered"

## 2022-05-17 ENCOUNTER — Encounter (HOSPITAL_COMMUNITY): Admission: RE | Disposition: A | Discharge status: Home / Self Care | Payer: Self-pay | Source: Home / Self Care

## 2022-05-17 ENCOUNTER — Encounter (HOSPITAL_COMMUNITY): Payer: Self-pay

## 2022-05-17 ENCOUNTER — Ambulatory Visit (HOSPITAL_COMMUNITY)
Admission: RE | Admit: 2022-05-17 | Discharge: 2022-05-17 | Disposition: A | Discharge status: Home / Self Care | Payer: Medicare Other | Source: Ambulatory Visit | Attending: Orthopedic Surgery | Admitting: Orthopedic Surgery

## 2022-05-17 DIAGNOSIS — M542 Cervicalgia: Secondary | ICD-10-CM | POA: Diagnosis not present

## 2022-05-17 DIAGNOSIS — Z7984 Long term (current) use of oral hypoglycemic drugs: Secondary | ICD-10-CM

## 2022-05-17 DIAGNOSIS — Z09 Encounter for follow-up examination after completed treatment for conditions other than malignant neoplasm: Secondary | ICD-10-CM | POA: Diagnosis not present

## 2022-05-17 DIAGNOSIS — I10 Essential (primary) hypertension: Secondary | ICD-10-CM

## 2022-05-17 DIAGNOSIS — Z87891 Personal history of nicotine dependence: Secondary | ICD-10-CM | POA: Diagnosis not present

## 2022-05-17 DIAGNOSIS — M4802 Spinal stenosis, cervical region: Secondary | ICD-10-CM | POA: Insufficient documentation

## 2022-05-17 DIAGNOSIS — E119 Type 2 diabetes mellitus without complications: Secondary | ICD-10-CM | POA: Diagnosis not present

## 2022-05-17 DIAGNOSIS — R202 Paresthesia of skin: Secondary | ICD-10-CM | POA: Diagnosis not present

## 2022-05-17 HISTORY — PX: RADIOLOGY WITH ANESTHESIA: SHX6223

## 2022-05-17 HISTORY — DX: Family history of other specified conditions: Z84.89

## 2022-05-17 HISTORY — DX: Polyneuropathy, unspecified: G62.9

## 2022-05-17 LAB — CBC
HCT: 39.8 % (ref 36.0–46.0)
Hemoglobin: 13.4 g/dL (ref 12.0–15.0)
MCH: 32.4 pg (ref 26.0–34.0)
MCHC: 33.7 g/dL (ref 30.0–36.0)
MCV: 96.1 fL (ref 80.0–100.0)
Platelets: 281 10*3/uL (ref 150–400)
RBC: 4.14 MIL/uL (ref 3.87–5.11)
RDW: 15.8 % — ABNORMAL HIGH (ref 11.5–15.5)
WBC: 7.2 10*3/uL (ref 4.0–10.5)
nRBC: 0 % (ref 0.0–0.2)

## 2022-05-17 LAB — BASIC METABOLIC PANEL
Anion gap: 11 (ref 5–15)
BUN: 10 mg/dL (ref 8–23)
CO2: 23 mmol/L (ref 22–32)
Calcium: 8.6 mg/dL — ABNORMAL LOW (ref 8.9–10.3)
Chloride: 107 mmol/L (ref 98–111)
Creatinine, Ser: 0.77 mg/dL (ref 0.44–1.00)
GFR, Estimated: >60 mL/min (ref >60)
Glucose, Bld: 87 mg/dL (ref 70–99)
Potassium: 4 mmol/L (ref 3.5–5.1)
Sodium: 141 mmol/L (ref 135–145)

## 2022-05-17 LAB — GLUCOSE, CAPILLARY
Glucose-Capillary: 93 mg/dL (ref 70–99)
Glucose-Capillary: 99 mg/dL (ref 70–99)

## 2022-05-17 SURGERY — MRI WITH ANESTHESIA
Anesthesia: General

## 2022-05-17 MED ORDER — MIDAZOLAM HCL 2 MG/2ML IJ SOLN
INTRAMUSCULAR | Status: AC
  Filled 2022-05-17: qty 2

## 2022-05-17 MED ORDER — INSULIN ASPART 100 UNIT/ML IJ SOLN: 0.0000 [IU] | INTRAMUSCULAR | Status: DC | PRN

## 2022-05-17 MED ORDER — CHLORHEXIDINE GLUCONATE 0.12 % MT SOLN: 15.0000 mL | Freq: Once | OROMUCOSAL | Status: AC

## 2022-05-17 MED ORDER — ORAL CARE MOUTH RINSE: 15.0000 mL | Freq: Once | OROMUCOSAL | Status: AC

## 2022-05-17 MED ORDER — PROPOFOL 10 MG/ML IV BOLUS
INTRAVENOUS | Status: DC | PRN
  Administered 2022-05-17: 150 mg via INTRAVENOUS
  Administered 2022-05-17: 20 mg via INTRAVENOUS

## 2022-05-17 MED ORDER — FENTANYL CITRATE (PF) 100 MCG/2ML IJ SOLN
INTRAMUSCULAR | Status: AC
  Filled 2022-05-17: qty 2

## 2022-05-17 MED ORDER — ONDANSETRON HCL 4 MG/2ML IJ SOLN
INTRAMUSCULAR | Status: DC | PRN
  Administered 2022-05-17: 4 mg via INTRAVENOUS

## 2022-05-17 MED ORDER — LACTATED RINGERS IV SOLN: INTRAVENOUS | Status: DC

## 2022-05-17 MED ORDER — FENTANYL CITRATE (PF) 100 MCG/2ML IJ SOLN: 25.0000 ug | INTRAMUSCULAR | Status: DC | PRN

## 2022-05-17 MED ORDER — FENTANYL CITRATE (PF) 100 MCG/2ML IJ SOLN
INTRAMUSCULAR | Status: DC | PRN
  Administered 2022-05-17: 25 ug via INTRAVENOUS
  Administered 2022-05-17: 50 ug via INTRAVENOUS

## 2022-05-17 MED ORDER — MIDAZOLAM HCL 5 MG/5ML IJ SOLN
INTRAMUSCULAR | Status: DC | PRN
  Administered 2022-05-17: 1 mg via INTRAVENOUS

## 2022-05-17 MED ORDER — EPHEDRINE SULFATE-NACL 50-0.9 MG/10ML-% IV SOSY
PREFILLED_SYRINGE | INTRAVENOUS | Status: DC | PRN
  Administered 2022-05-17: 5 mg via INTRAVENOUS

## 2022-05-17 MED ORDER — GLYCOPYRROLATE 0.2 MG/ML IJ SOLN
INTRAMUSCULAR | Status: DC | PRN
  Administered 2022-05-17: .1 mg via INTRAVENOUS

## 2022-05-17 MED ORDER — CHLORHEXIDINE GLUCONATE 0.12 % MT SOLN
OROMUCOSAL | Status: AC
  Administered 2022-05-17: 15 mL via OROMUCOSAL
  Filled 2022-05-17: qty 15

## 2022-05-17 MED ORDER — LIDOCAINE 2% (20 MG/ML) 5 ML SYRINGE
INTRAMUSCULAR | Status: DC | PRN
  Administered 2022-05-17: 60 mg via INTRAVENOUS

## 2022-05-17 NOTE — Anesthesia Postprocedure Evaluation (Signed)
Anesthesia Post Note  Patient: Karla Baker  Procedure(s) Performed: MRI WITH CERVICAL SPINE WITHOUT CONTRAST     Patient location during evaluation: PACU Anesthesia Type: General Level of consciousness: awake and alert Pain management: pain level controlled Vital Signs Assessment: post-procedure vital signs reviewed and stable Respiratory status: spontaneous breathing, nonlabored ventilation, respiratory function stable and patient connected to nasal cannula oxygen Cardiovascular status: blood pressure returned to baseline and stable Postop Assessment: no apparent nausea or vomiting Anesthetic complications: no  No notable events documented.  Last Vitals:  Vitals:   05/17/22 1115 05/17/22 1130  BP: (!) 153/86 (!) 150/93  Pulse: 82 78  Resp: 20 18  Temp:  36.4 C  SpO2: 99% 98%    Last Pain:  Vitals:   05/17/22 1130  TempSrc:   PainSc: 0-No pain                 Sabastien Tyler L Sanjuana Mruk

## 2022-05-17 NOTE — Anesthesia Procedure Notes (Signed)
Procedure Name: LMA Insertion Date/Time: 05/17/2022 10:19 AM  Performed by: Gwyndolyn Saxon, CRNAPre-anesthesia Checklist: Patient identified, Emergency Drugs available, Suction available and Patient being monitored Patient Re-evaluated:Patient Re-evaluated prior to induction Oxygen Delivery Method: Circle system utilized Preoxygenation: Pre-oxygenation with 100% oxygen Induction Type: IV induction Ventilation: Mask ventilation without difficulty LMA: LMA with gastric port inserted LMA Size: 4.0 Number of attempts: 1 Placement Confirmation: positive ETCO2 and breath sounds checked- equal and bilateral Tube secured with: Tape Dental Injury: Teeth and Oropharynx as per pre-operative assessment

## 2022-05-17 NOTE — Anesthesia Preprocedure Evaluation (Addendum)
Anesthesia Evaluation  Patient identified by MRN, date of birth, ID band Patient awake    Reviewed: Allergy & Precautions, NPO status , Patient's Chart, lab work & pertinent test results  Airway Mallampati: I  TM Distance: >3 FB Neck ROM: Full    Dental no notable dental hx. (+) Teeth Intact, Dental Advisory Given   Pulmonary sleep apnea , former smoker   Pulmonary exam normal breath sounds clear to auscultation       Cardiovascular hypertension, Normal cardiovascular exam Rhythm:Regular Rate:Normal     Neuro/Psych  Headaches PSYCHIATRIC DISORDERS  Depression       GI/Hepatic ,GERD  ,,(+)     substance abuse  marijuana use  Endo/Other  diabetes, Type 2, Oral Hypoglycemic Agents    Renal/GU negative Renal ROS  negative genitourinary   Musculoskeletal  (+) Arthritis ,    Abdominal   Peds  Hematology negative hematology ROS (+)   Anesthesia Other Findings   Reproductive/Obstetrics                             Anesthesia Physical Anesthesia Plan  ASA: 3  Anesthesia Plan: General   Post-op Pain Management:    Induction: Intravenous  PONV Risk Score and Plan: 3 and Propofol infusion, Treatment may vary due to age or medical condition, Midazolam and Ondansetron  Airway Management Planned: Oral ETT  Additional Equipment:   Intra-op Plan:   Post-operative Plan: Extubation in OR  Informed Consent: I have reviewed the patients History and Physical, chart, labs and discussed the procedure including the risks, benefits and alternatives for the proposed anesthesia with the patient or authorized representative who has indicated his/her understanding and acceptance.     Dental advisory given  Plan Discussed with: CRNA  Anesthesia Plan Comments:        Anesthesia Quick Evaluation

## 2022-05-17 NOTE — Transfer of Care (Signed)
Immediate Anesthesia Transfer of Care Note  Patient: MARLI APICELLA  Procedure(s) Performed: MRI WITH CERVICAL SPINE WITHOUT CONTRAST  Patient Location: PACU  Anesthesia Type:General  Level of Consciousness: drowsy and patient cooperative  Airway & Oxygen Therapy: Patient Spontanous Breathing and Patient connected to nasal cannula oxygen  Post-op Assessment: Report given to RN and Post -op Vital signs reviewed and stable  Post vital signs: Reviewed and stable  Last Vitals:  Vitals Value Taken Time  BP 142/108 05/17/22 1122  Temp    Pulse 75 05/17/22 1123  Resp 15 05/17/22 1123  SpO2 97 % 05/17/22 1123  Vitals shown include unvalidated device data.  Last Pain:  Vitals:   05/17/22 1115  TempSrc:   PainSc: 0-No pain         Complications: No notable events documented.

## 2022-05-17 NOTE — Discharge Instructions (Signed)

## 2022-05-18 ENCOUNTER — Encounter (HOSPITAL_COMMUNITY): Payer: Self-pay | Admitting: Radiology

## 2022-05-25 DIAGNOSIS — M25561 Pain in right knee: Secondary | ICD-10-CM | POA: Diagnosis not present

## 2022-08-07 ENCOUNTER — Emergency Department (HOSPITAL_COMMUNITY): Payer: No Typology Code available for payment source

## 2022-08-07 ENCOUNTER — Encounter (HOSPITAL_COMMUNITY): Payer: Self-pay

## 2022-08-07 ENCOUNTER — Emergency Department (HOSPITAL_COMMUNITY)
Admission: EM | Admit: 2022-08-07 | Discharge: 2022-08-07 | Disposition: A | Discharge status: Home / Self Care | Payer: No Typology Code available for payment source | Attending: Emergency Medicine | Admitting: Emergency Medicine

## 2022-08-07 ENCOUNTER — Other Ambulatory Visit: Payer: Self-pay

## 2022-08-07 DIAGNOSIS — Y92410 Unspecified street and highway as the place of occurrence of the external cause: Secondary | ICD-10-CM | POA: Diagnosis not present

## 2022-08-07 DIAGNOSIS — M542 Cervicalgia: Secondary | ICD-10-CM | POA: Diagnosis not present

## 2022-08-07 DIAGNOSIS — M47812 Spondylosis without myelopathy or radiculopathy, cervical region: Secondary | ICD-10-CM | POA: Diagnosis not present

## 2022-08-07 DIAGNOSIS — S0990XA Unspecified injury of head, initial encounter: Secondary | ICD-10-CM | POA: Diagnosis not present

## 2022-08-07 DIAGNOSIS — M5137 Other intervertebral disc degeneration, lumbosacral region: Secondary | ICD-10-CM | POA: Diagnosis not present

## 2022-08-07 DIAGNOSIS — M545 Low back pain, unspecified: Secondary | ICD-10-CM | POA: Diagnosis not present

## 2022-08-07 DIAGNOSIS — M549 Dorsalgia, unspecified: Secondary | ICD-10-CM | POA: Diagnosis not present

## 2022-08-07 MED ORDER — IBUPROFEN 800 MG PO TABS: 800.0000 mg | ORAL_TABLET | Freq: Three times a day (TID) | ORAL | 0 refills | Status: AC | PRN
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
## 2022-08-07 NOTE — Discharge Instructions (Signed)
Follow-up with your doctor within a week if not improving

## 2022-08-16 DIAGNOSIS — M545 Low back pain, unspecified: Secondary | ICD-10-CM | POA: Diagnosis not present

## 2022-08-16 DIAGNOSIS — M542 Cervicalgia: Secondary | ICD-10-CM | POA: Diagnosis not present

## 2022-08-30 DIAGNOSIS — M545 Low back pain, unspecified: Secondary | ICD-10-CM | POA: Diagnosis not present

## 2022-08-30 DIAGNOSIS — M542 Cervicalgia: Secondary | ICD-10-CM | POA: Diagnosis not present

## 2022-08-30 DIAGNOSIS — E1169 Type 2 diabetes mellitus with other specified complication: Secondary | ICD-10-CM | POA: Diagnosis not present

## 2022-09-26 DIAGNOSIS — M542 Cervicalgia: Secondary | ICD-10-CM | POA: Diagnosis not present

## 2022-09-26 DIAGNOSIS — S134XXA Sprain of ligaments of cervical spine, initial encounter: Secondary | ICD-10-CM | POA: Diagnosis not present

## 2022-09-26 DIAGNOSIS — M545 Low back pain, unspecified: Secondary | ICD-10-CM | POA: Diagnosis not present

## 2022-10-31 DIAGNOSIS — S134XXD Sprain of ligaments of cervical spine, subsequent encounter: Secondary | ICD-10-CM | POA: Diagnosis not present

## 2022-10-31 DIAGNOSIS — M545 Low back pain, unspecified: Secondary | ICD-10-CM | POA: Diagnosis not present

## 2022-12-03 DIAGNOSIS — M545 Low back pain, unspecified: Secondary | ICD-10-CM | POA: Diagnosis not present

## 2022-12-03 DIAGNOSIS — S134XXD Sprain of ligaments of cervical spine, subsequent encounter: Secondary | ICD-10-CM | POA: Diagnosis not present

## 2022-12-13 DIAGNOSIS — M545 Low back pain, unspecified: Secondary | ICD-10-CM | POA: Diagnosis not present

## 2022-12-13 DIAGNOSIS — E1169 Type 2 diabetes mellitus with other specified complication: Secondary | ICD-10-CM | POA: Diagnosis not present

## 2022-12-30 DIAGNOSIS — H903 Sensorineural hearing loss, bilateral: Secondary | ICD-10-CM | POA: Diagnosis not present

## 2022-12-30 DIAGNOSIS — D333 Benign neoplasm of cranial nerves: Secondary | ICD-10-CM | POA: Diagnosis not present

## 2023-01-24 DIAGNOSIS — I2510 Atherosclerotic heart disease of native coronary artery without angina pectoris: Secondary | ICD-10-CM | POA: Diagnosis not present

## 2023-01-24 DIAGNOSIS — G4733 Obstructive sleep apnea (adult) (pediatric): Secondary | ICD-10-CM | POA: Diagnosis not present

## 2023-01-24 DIAGNOSIS — J432 Centrilobular emphysema: Secondary | ICD-10-CM | POA: Diagnosis not present

## 2023-01-24 DIAGNOSIS — N3281 Overactive bladder: Secondary | ICD-10-CM | POA: Diagnosis not present

## 2023-01-24 DIAGNOSIS — E039 Hypothyroidism, unspecified: Secondary | ICD-10-CM | POA: Diagnosis not present

## 2023-01-24 DIAGNOSIS — E1169 Type 2 diabetes mellitus with other specified complication: Secondary | ICD-10-CM | POA: Diagnosis not present

## 2023-01-24 DIAGNOSIS — I119 Hypertensive heart disease without heart failure: Secondary | ICD-10-CM | POA: Diagnosis not present

## 2023-04-24 DIAGNOSIS — I119 Hypertensive heart disease without heart failure: Secondary | ICD-10-CM | POA: Diagnosis not present

## 2023-04-24 DIAGNOSIS — J439 Emphysema, unspecified: Secondary | ICD-10-CM | POA: Diagnosis not present

## 2023-04-24 DIAGNOSIS — G4733 Obstructive sleep apnea (adult) (pediatric): Secondary | ICD-10-CM | POA: Diagnosis not present

## 2023-04-24 DIAGNOSIS — Z6821 Body mass index (BMI) 21.0-21.9, adult: Secondary | ICD-10-CM | POA: Diagnosis not present

## 2023-04-24 DIAGNOSIS — I10 Essential (primary) hypertension: Secondary | ICD-10-CM | POA: Diagnosis not present

## 2023-04-24 DIAGNOSIS — N3281 Overactive bladder: Secondary | ICD-10-CM | POA: Diagnosis not present

## 2023-04-24 DIAGNOSIS — J432 Centrilobular emphysema: Secondary | ICD-10-CM | POA: Diagnosis not present

## 2023-04-24 DIAGNOSIS — E1169 Type 2 diabetes mellitus with other specified complication: Secondary | ICD-10-CM | POA: Diagnosis not present

## 2023-04-24 DIAGNOSIS — E039 Hypothyroidism, unspecified: Secondary | ICD-10-CM | POA: Diagnosis not present

## 2023-04-24 DIAGNOSIS — I2510 Atherosclerotic heart disease of native coronary artery without angina pectoris: Secondary | ICD-10-CM | POA: Diagnosis not present

## 2023-06-27 DIAGNOSIS — G4733 Obstructive sleep apnea (adult) (pediatric): Secondary | ICD-10-CM | POA: Diagnosis not present

## 2023-06-27 DIAGNOSIS — N3281 Overactive bladder: Secondary | ICD-10-CM | POA: Diagnosis not present

## 2023-06-27 DIAGNOSIS — I119 Hypertensive heart disease without heart failure: Secondary | ICD-10-CM | POA: Diagnosis not present

## 2023-06-27 DIAGNOSIS — E039 Hypothyroidism, unspecified: Secondary | ICD-10-CM | POA: Diagnosis not present

## 2023-06-27 DIAGNOSIS — I2510 Atherosclerotic heart disease of native coronary artery without angina pectoris: Secondary | ICD-10-CM | POA: Diagnosis not present

## 2023-06-27 DIAGNOSIS — J432 Centrilobular emphysema: Secondary | ICD-10-CM | POA: Diagnosis not present

## 2023-10-30 DIAGNOSIS — G4733 Obstructive sleep apnea (adult) (pediatric): Secondary | ICD-10-CM | POA: Diagnosis not present

## 2023-10-30 DIAGNOSIS — I2510 Atherosclerotic heart disease of native coronary artery without angina pectoris: Secondary | ICD-10-CM | POA: Diagnosis not present

## 2023-10-30 DIAGNOSIS — I119 Hypertensive heart disease without heart failure: Secondary | ICD-10-CM | POA: Diagnosis not present

## 2023-10-30 DIAGNOSIS — N3281 Overactive bladder: Secondary | ICD-10-CM | POA: Diagnosis not present

## 2023-10-30 DIAGNOSIS — E7800 Pure hypercholesterolemia, unspecified: Secondary | ICD-10-CM | POA: Diagnosis not present

## 2023-10-30 DIAGNOSIS — E1169 Type 2 diabetes mellitus with other specified complication: Secondary | ICD-10-CM | POA: Diagnosis not present

## 2023-10-30 DIAGNOSIS — J432 Centrilobular emphysema: Secondary | ICD-10-CM | POA: Diagnosis not present

## 2023-12-29 DIAGNOSIS — E1159 Type 2 diabetes mellitus with other circulatory complications: Secondary | ICD-10-CM | POA: Diagnosis not present

## 2023-12-29 DIAGNOSIS — E041 Nontoxic single thyroid nodule: Secondary | ICD-10-CM | POA: Diagnosis not present

## 2023-12-29 DIAGNOSIS — E890 Postprocedural hypothyroidism: Secondary | ICD-10-CM | POA: Diagnosis not present

## 2023-12-29 DIAGNOSIS — M5412 Radiculopathy, cervical region: Secondary | ICD-10-CM | POA: Diagnosis not present

## 2023-12-29 DIAGNOSIS — J439 Emphysema, unspecified: Secondary | ICD-10-CM | POA: Diagnosis not present

## 2023-12-29 DIAGNOSIS — I700 Atherosclerosis of aorta: Secondary | ICD-10-CM | POA: Diagnosis not present

## 2023-12-29 DIAGNOSIS — Z87891 Personal history of nicotine dependence: Secondary | ICD-10-CM | POA: Diagnosis not present

## 2023-12-29 DIAGNOSIS — R918 Other nonspecific abnormal finding of lung field: Secondary | ICD-10-CM | POA: Diagnosis not present

## 2023-12-29 DIAGNOSIS — Z0000 Encounter for general adult medical examination without abnormal findings: Secondary | ICD-10-CM | POA: Diagnosis not present

## 2023-12-29 DIAGNOSIS — I2510 Atherosclerotic heart disease of native coronary artery without angina pectoris: Secondary | ICD-10-CM | POA: Diagnosis not present
